# Patient Record
Sex: Female | Born: 1975 | Race: White | Hispanic: No | Smoking: Current every day smoker
Health system: Southern US, Community
[De-identification: ages and names within clinical notes are randomized; demographics above are authoritative.]

## PROBLEM LIST (undated history)

## (undated) DIAGNOSIS — F909 Attention-deficit hyperactivity disorder, unspecified type: Secondary | ICD-10-CM

## (undated) DIAGNOSIS — E7800 Pure hypercholesterolemia, unspecified: Secondary | ICD-10-CM

## (undated) DIAGNOSIS — F32A Depression, unspecified: Secondary | ICD-10-CM

## (undated) DIAGNOSIS — E079 Disorder of thyroid, unspecified: Secondary | ICD-10-CM

## (undated) DIAGNOSIS — F329 Major depressive disorder, single episode, unspecified: Secondary | ICD-10-CM

## (undated) DIAGNOSIS — I10 Essential (primary) hypertension: Secondary | ICD-10-CM

## (undated) HISTORY — PX: TUBAL LIGATION: SHX77

## (undated) HISTORY — PX: TONSILLECTOMY: SUR1361

---

## 1998-07-12 ENCOUNTER — Emergency Department (HOSPITAL_COMMUNITY): Admission: EM | Admit: 1998-07-12 | Discharge: 1998-07-12 | Payer: Self-pay | Admitting: Emergency Medicine

## 1999-01-10 ENCOUNTER — Emergency Department (HOSPITAL_COMMUNITY): Admission: EM | Admit: 1999-01-10 | Discharge: 1999-01-10 | Payer: Self-pay | Admitting: Emergency Medicine

## 1999-04-07 ENCOUNTER — Other Ambulatory Visit: Admission: RE | Admit: 1999-04-07 | Discharge: 1999-04-07 | Payer: Self-pay | Admitting: Gynecology

## 1999-07-04 ENCOUNTER — Inpatient Hospital Stay (HOSPITAL_COMMUNITY): Admission: AD | Admit: 1999-07-04 | Discharge: 1999-07-04 | Payer: Self-pay | Admitting: Obstetrics & Gynecology

## 1999-07-30 ENCOUNTER — Inpatient Hospital Stay (HOSPITAL_COMMUNITY): Admission: AD | Admit: 1999-07-30 | Discharge: 1999-08-01 | Payer: Self-pay | Admitting: Obstetrics and Gynecology

## 1999-10-03 ENCOUNTER — Inpatient Hospital Stay (HOSPITAL_COMMUNITY): Admission: AD | Admit: 1999-10-03 | Discharge: 1999-10-03 | Payer: Self-pay | Admitting: Obstetrics and Gynecology

## 1999-10-04 ENCOUNTER — Inpatient Hospital Stay (HOSPITAL_COMMUNITY): Admission: AD | Admit: 1999-10-04 | Discharge: 1999-10-06 | Payer: Self-pay | Admitting: Obstetrics and Gynecology

## 1999-11-08 ENCOUNTER — Other Ambulatory Visit: Admission: RE | Admit: 1999-11-08 | Discharge: 1999-11-08 | Payer: Self-pay | Admitting: Obstetrics and Gynecology

## 2000-08-21 ENCOUNTER — Inpatient Hospital Stay (HOSPITAL_COMMUNITY): Admission: AD | Admit: 2000-08-21 | Discharge: 2000-08-21 | Payer: Self-pay | Admitting: Obstetrics

## 2000-10-06 ENCOUNTER — Inpatient Hospital Stay (HOSPITAL_COMMUNITY): Admission: AD | Admit: 2000-10-06 | Discharge: 2000-10-06 | Payer: Self-pay | Admitting: *Deleted

## 2001-05-08 ENCOUNTER — Inpatient Hospital Stay (HOSPITAL_COMMUNITY): Admission: AD | Admit: 2001-05-08 | Discharge: 2001-05-08 | Payer: Self-pay | Admitting: *Deleted

## 2001-06-23 ENCOUNTER — Emergency Department (HOSPITAL_COMMUNITY): Admission: EM | Admit: 2001-06-23 | Discharge: 2001-06-23 | Payer: Self-pay | Admitting: Cardiology

## 2002-10-13 ENCOUNTER — Emergency Department (HOSPITAL_COMMUNITY): Admission: EM | Admit: 2002-10-13 | Discharge: 2002-10-13 | Payer: Self-pay | Admitting: Emergency Medicine

## 2002-11-27 ENCOUNTER — Emergency Department (HOSPITAL_COMMUNITY): Admission: EM | Admit: 2002-11-27 | Discharge: 2002-11-27 | Payer: Self-pay | Admitting: Emergency Medicine

## 2002-11-27 ENCOUNTER — Encounter: Payer: Self-pay | Admitting: Emergency Medicine

## 2002-11-29 ENCOUNTER — Encounter: Payer: Self-pay | Admitting: *Deleted

## 2002-11-29 ENCOUNTER — Emergency Department (HOSPITAL_COMMUNITY): Admission: EM | Admit: 2002-11-29 | Discharge: 2002-11-29 | Payer: Self-pay | Admitting: *Deleted

## 2003-01-01 ENCOUNTER — Emergency Department (HOSPITAL_COMMUNITY): Admission: EM | Admit: 2003-01-01 | Discharge: 2003-01-01 | Payer: Self-pay | Admitting: Emergency Medicine

## 2003-04-23 ENCOUNTER — Inpatient Hospital Stay (HOSPITAL_COMMUNITY): Admission: AD | Admit: 2003-04-23 | Discharge: 2003-04-23 | Payer: Self-pay | Admitting: *Deleted

## 2003-07-20 ENCOUNTER — Emergency Department (HOSPITAL_COMMUNITY): Admission: EM | Admit: 2003-07-20 | Discharge: 2003-07-20 | Payer: Self-pay | Admitting: Emergency Medicine

## 2003-07-20 IMAGING — CT CT HEAD W/O CM
2 of 3 series · 14 of 33 positions shown, 18 images · non-contrast
Comparison: none

CLINICAL DATA: Headache.  

 HEAD CT WITHOUT CONTRAST, [DATE]
TECHNIQUE: Routine uninfused CT scanning of the brain was performed.  
 There is no evidence of intracranial hemorrhage, brain edema, or mass effect. The ventricles are normal. No extra-axial abnormalities are identified. Bone windows show no significant abnormalities.
 IMPRESSION
 Negative non-contrast head CT. 
 [REDACTED]

[Series 2: — · sagittal · 0.43mm/px · 1 of 3 slices shown (1 of 2)]
[im 2/3  brain]
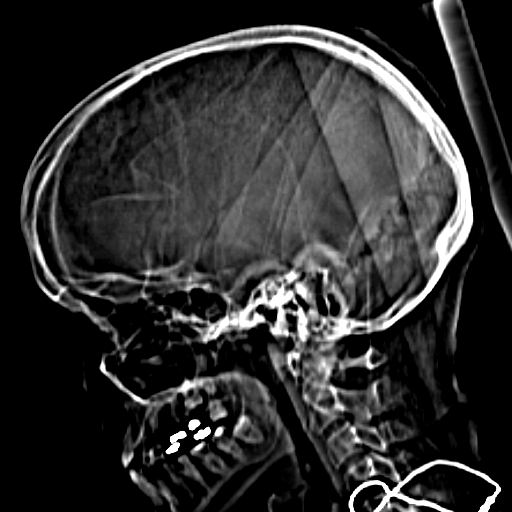

[Series 3: — · axial · 0.43mm/px · z∈[-162,-52]mm · 13 of 26 slices shown, 17 images (2 of 2)]
[im 2/26  brain]
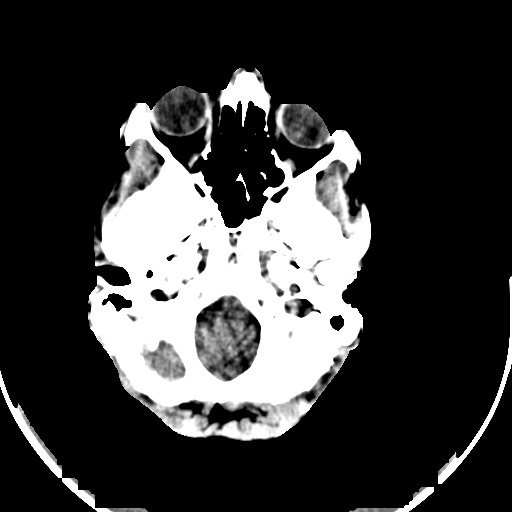
[im 2/26  bone]
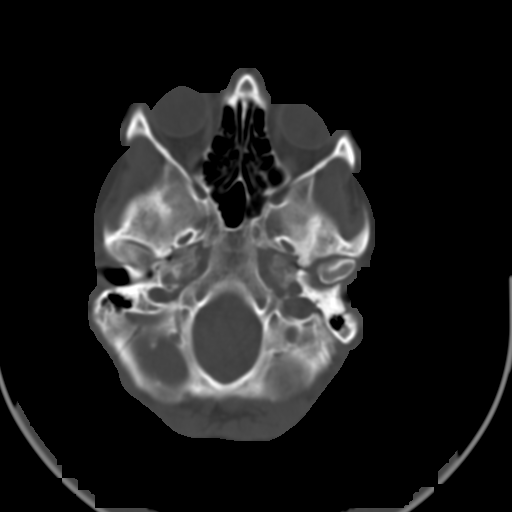
[im 4/26  brain]
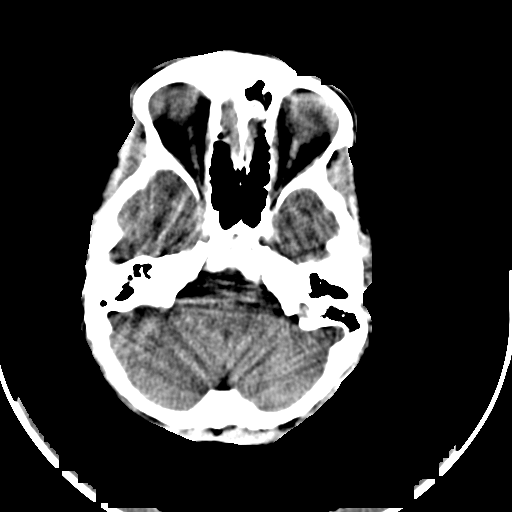
[im 6/26  brain]
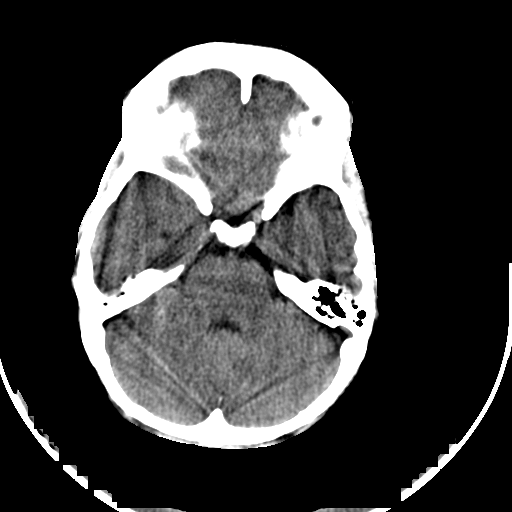
[im 8/26  brain]
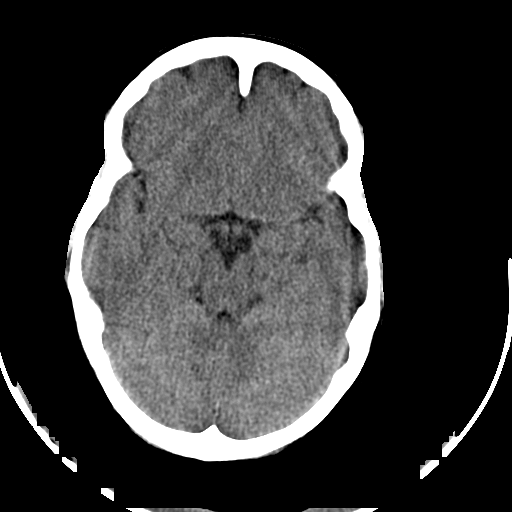
[im 9/26  brain]
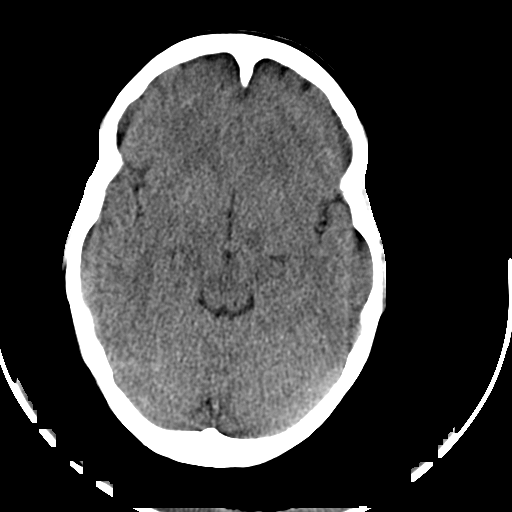
[im 9/26  bone]
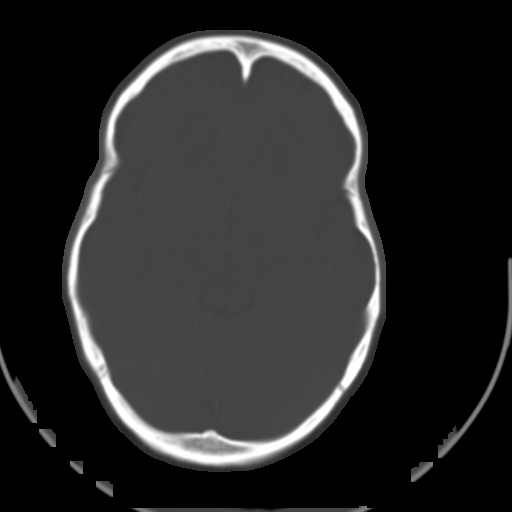
[im 11/26  brain]
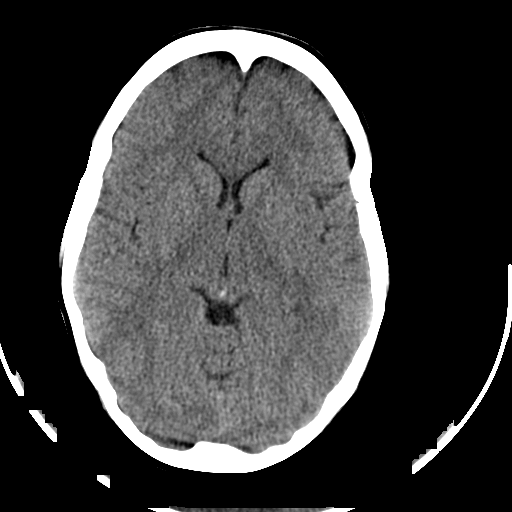
[im 13/26  brain]
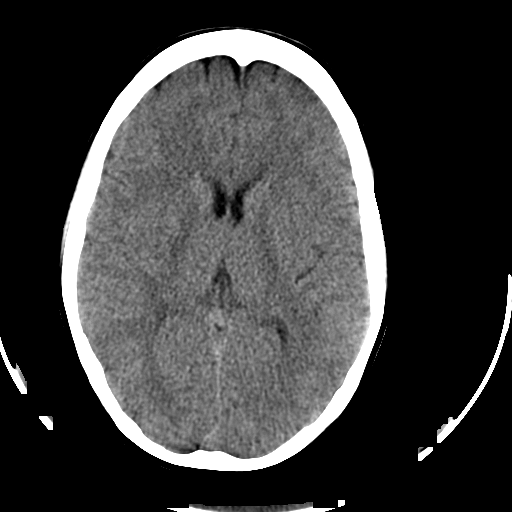
[im 15/26  brain]
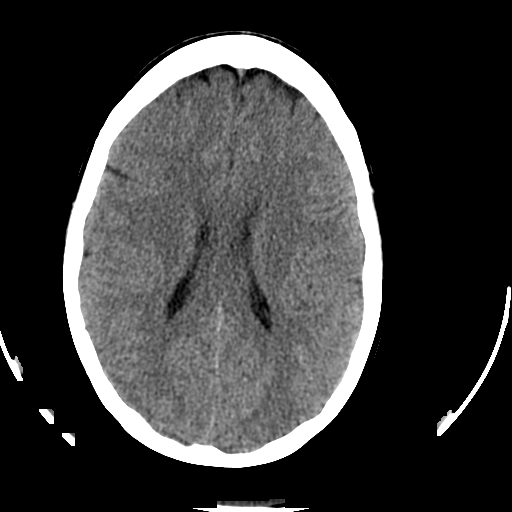
[im 17/26  brain]
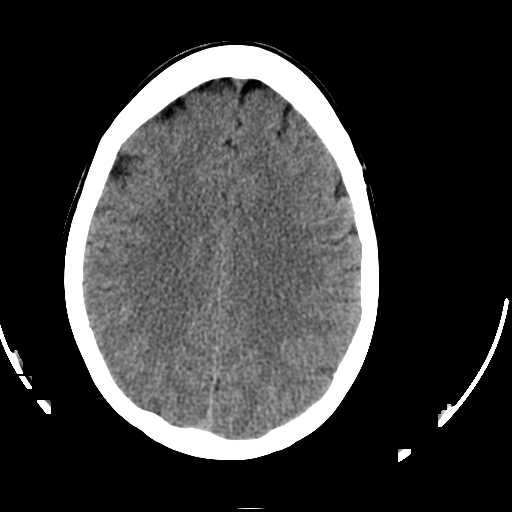
[im 17/26  bone]
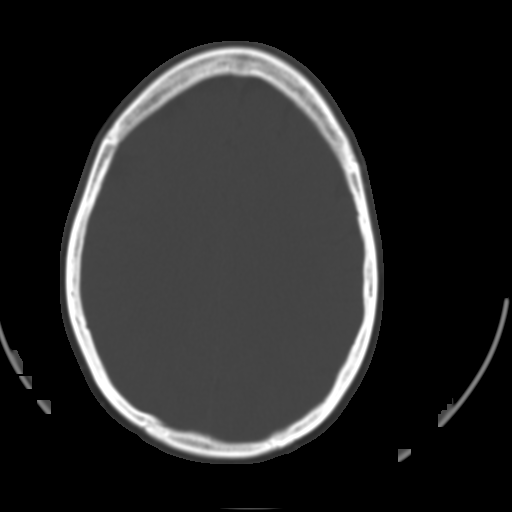
[im 18/26  brain]
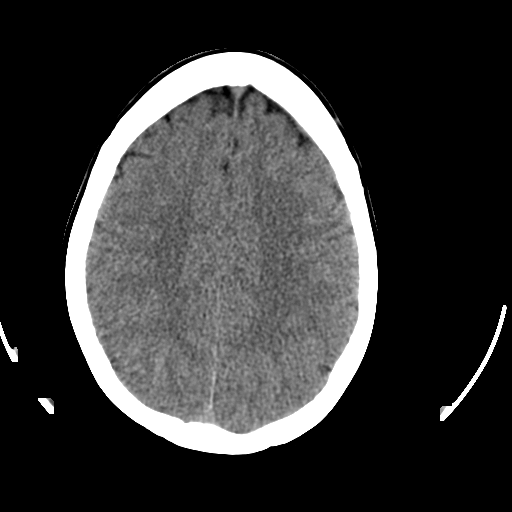
[im 20/26  brain]
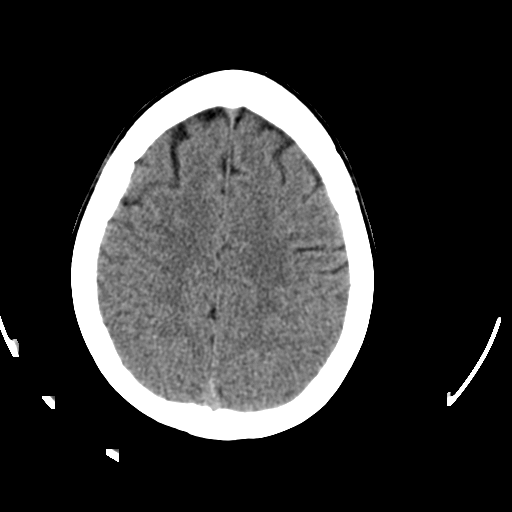
[im 22/26  brain]
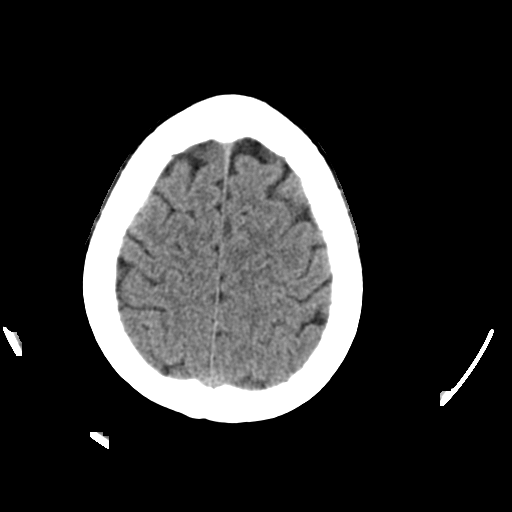
[im 24/26  brain]
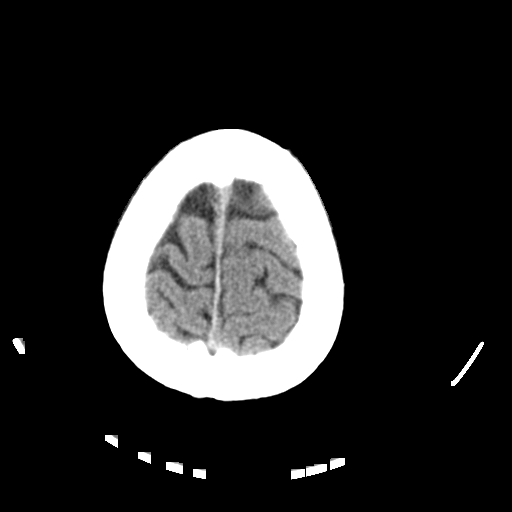
[im 24/26  bone]
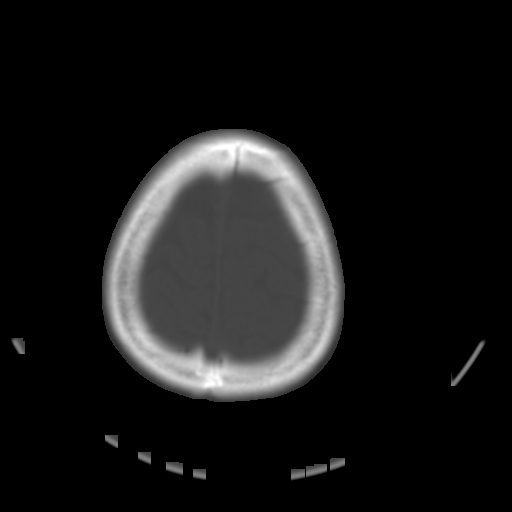

[14 of 33 positions shown; findings below may reference images not displayed]

## 2003-08-28 ENCOUNTER — Inpatient Hospital Stay (HOSPITAL_COMMUNITY): Admission: AD | Admit: 2003-08-28 | Discharge: 2003-08-28 | Payer: Self-pay | Admitting: Obstetrics & Gynecology

## 2003-08-28 IMAGING — US US OB COMP LESS 14 WK
1 series · 14 of 28 positions shown · non-contrast
Comparison: none

CLINICAL DATA: Early pregnancy with bleeding and abdominal pain.  
EARLY OBSTETRICAL ULTRASOUND WTH TRANSVAGINAL:
Multiple images of the uterus and adnexa were obtained using a transabdominal and endovaginal approaches. 
There is an intrauterine gestational sac identified which correlates with a 5 week 4 day gestation.  A yolk sac is seen, but no evidence for a fetal pole is discerned.  This would not be expected at the mean sac diameter of 9.1 mm obtained today and follow-up evaluation in one weeks time can be undertaken to assess for appropriate progression of pregnancy if desired.
A small subchorionic hemorrhage is seen.
Both ovaries are visualized and have a normal appearance with the right ovary measuring 2.3  x1.7 x 2.1 cm and the left ovary measuring 1.8 x 1.9 x 1.9 cm.  No cul-de-sac or periovarian fluid is seen and no separate adnexal masses are noted.
IMPRESSION
5 week 4 day intrauterine gestational sac with yolk sac.  Follow-up evaluation can be undertaken in one week if desired for assessment of appropriate progression of pregnancy.  Small subchorionic hemorrhage.  Normal ovaries.

[Series 1: unknown · 0.23mm/px · 14 of 52 slices shown]
[im 2/52]
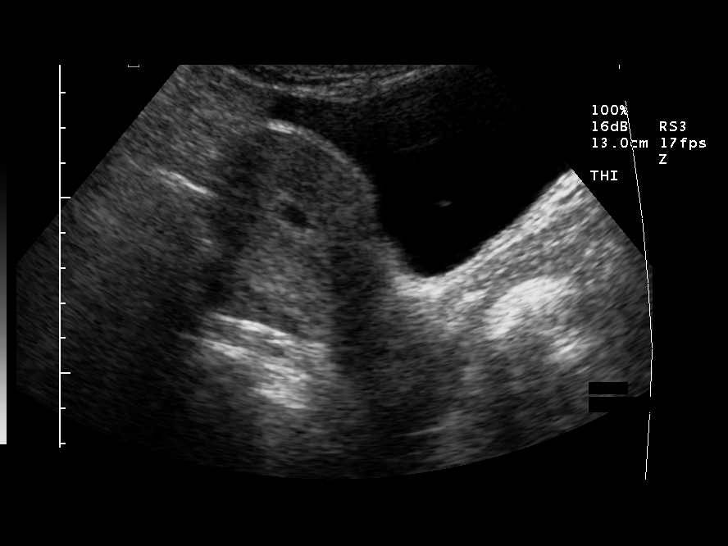
[im 6/52]
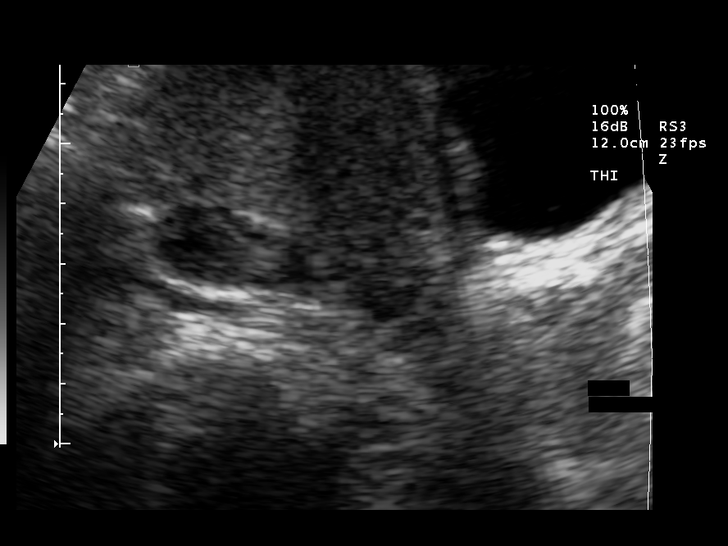
[im 10/52]
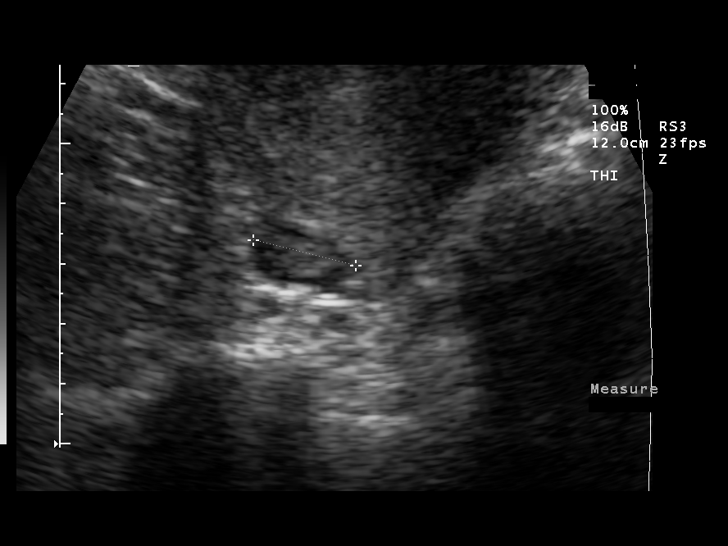
[im 14/52]
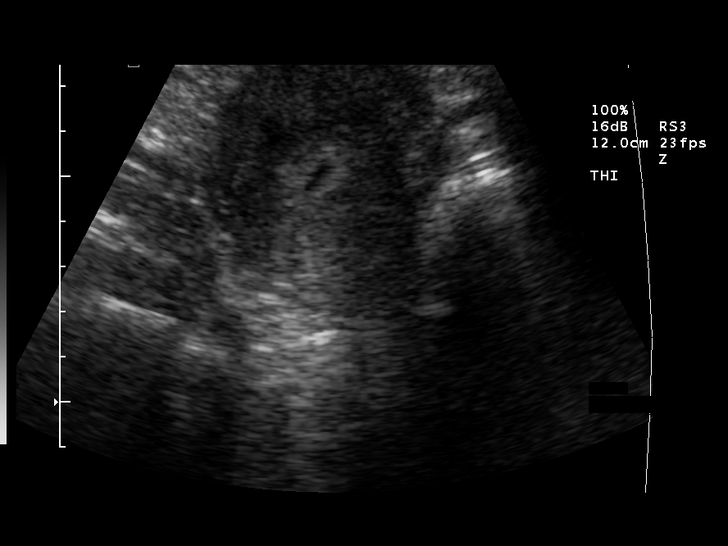
[im 18/52]
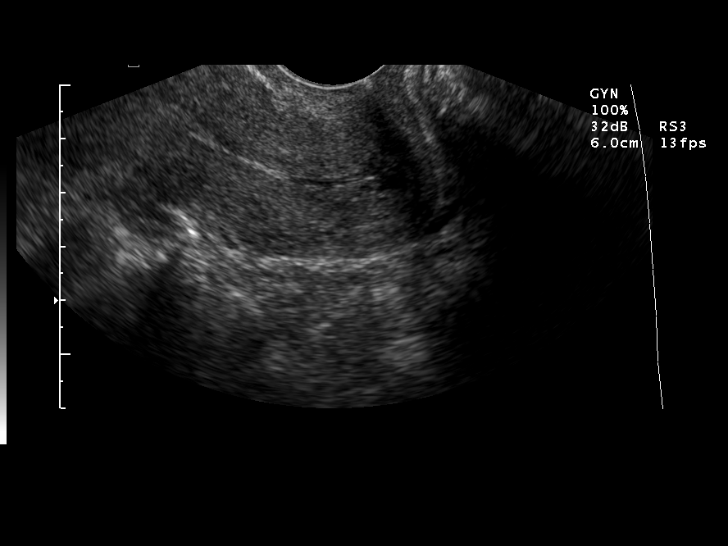
[im 21/52]
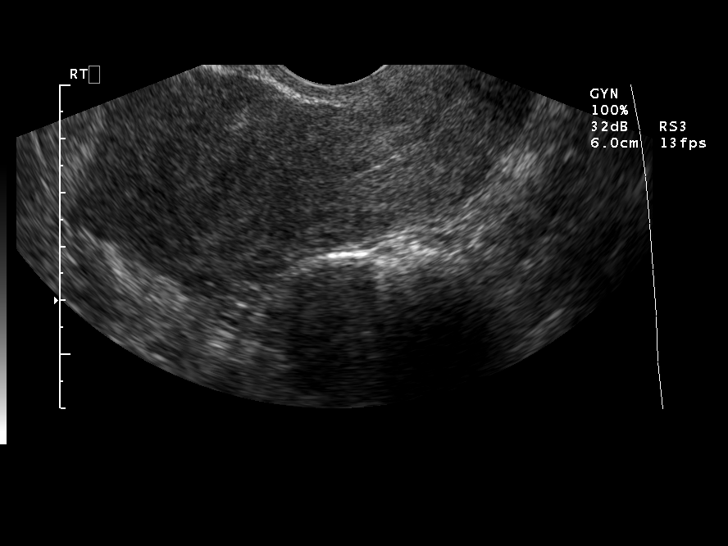
[im 25/52]
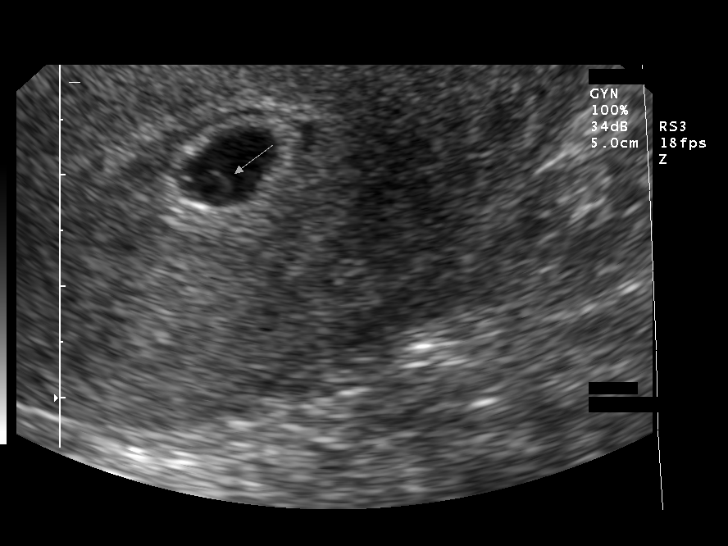
[im 29/52]
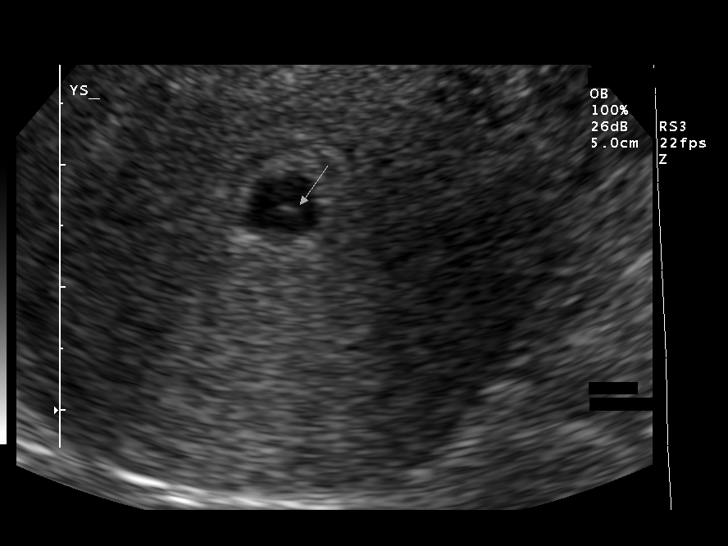
[im 33/52]
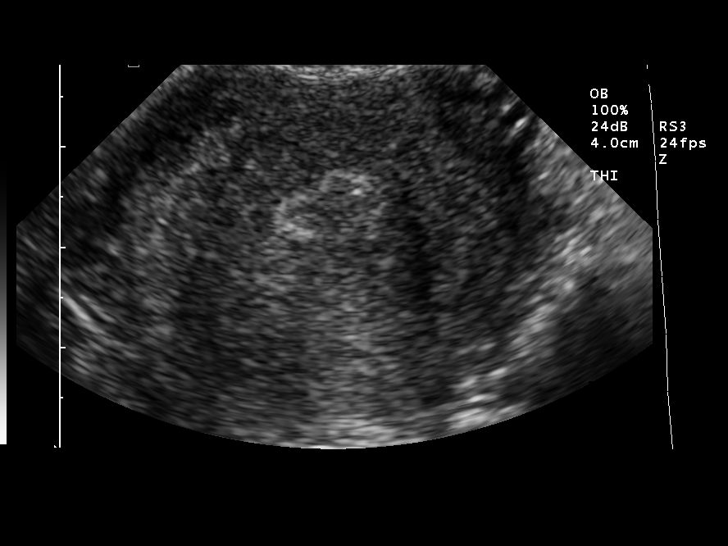
[im 36/52]
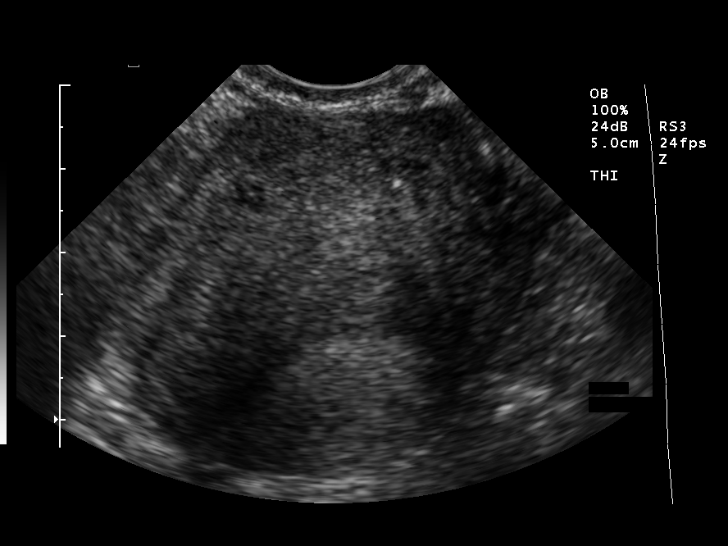
[im 40/52]
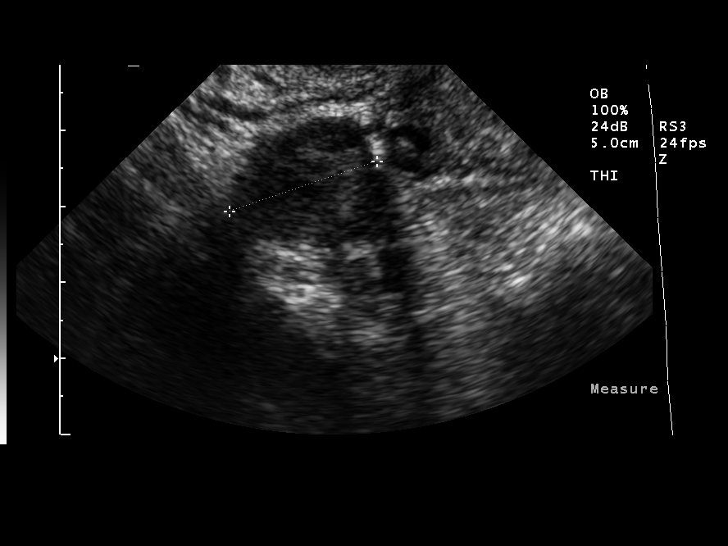
[im 44/52]
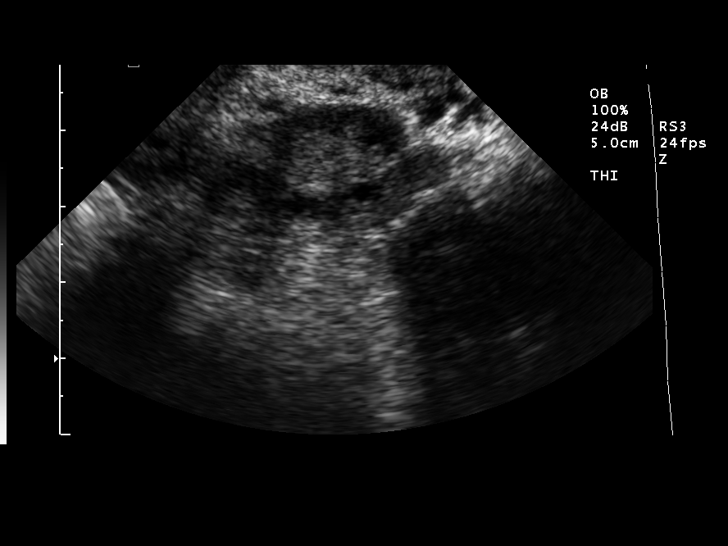
[im 48/52]
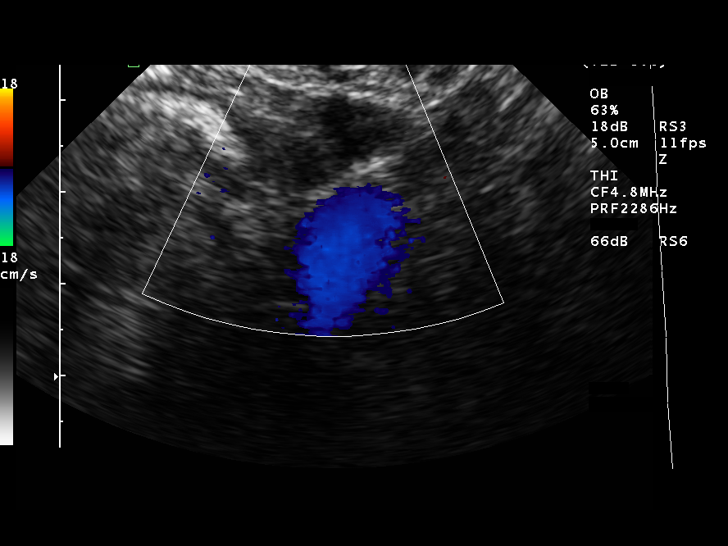
[im 52/52]
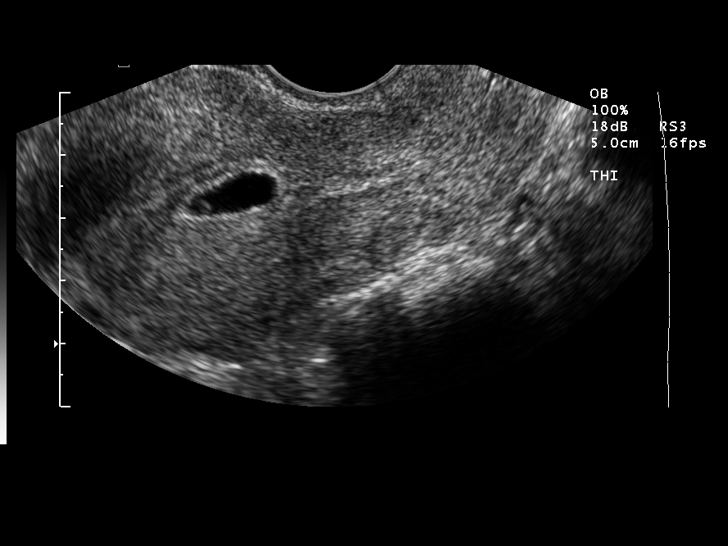

[14 of 28 positions shown; findings below may reference images not displayed]

## 2003-08-30 ENCOUNTER — Inpatient Hospital Stay (HOSPITAL_COMMUNITY): Admission: AD | Admit: 2003-08-30 | Discharge: 2003-08-30 | Payer: Self-pay | Admitting: Family Medicine

## 2004-08-02 ENCOUNTER — Emergency Department (HOSPITAL_COMMUNITY): Admission: EM | Admit: 2004-08-02 | Discharge: 2004-08-02 | Payer: Self-pay | Admitting: Emergency Medicine

## 2006-11-16 ENCOUNTER — Ambulatory Visit (HOSPITAL_BASED_OUTPATIENT_CLINIC_OR_DEPARTMENT_OTHER): Admission: RE | Admit: 2006-11-16 | Discharge: 2006-11-16 | Payer: Self-pay | Admitting: Otolaryngology

## 2007-03-02 ENCOUNTER — Inpatient Hospital Stay (HOSPITAL_COMMUNITY): Admission: AD | Admit: 2007-03-02 | Discharge: 2007-03-02 | Payer: Self-pay | Admitting: Obstetrics and Gynecology

## 2009-07-07 ENCOUNTER — Emergency Department (HOSPITAL_COMMUNITY): Admission: EM | Admit: 2009-07-07 | Discharge: 2009-07-07 | Payer: Self-pay | Admitting: Family Medicine

## 2009-11-24 ENCOUNTER — Emergency Department (HOSPITAL_BASED_OUTPATIENT_CLINIC_OR_DEPARTMENT_OTHER): Admission: EM | Admit: 2009-11-24 | Discharge: 2009-11-24 | Payer: Self-pay | Admitting: Emergency Medicine

## 2010-01-23 ENCOUNTER — Emergency Department (HOSPITAL_COMMUNITY): Admission: EM | Admit: 2010-01-23 | Discharge: 2010-01-23 | Payer: Self-pay | Admitting: Emergency Medicine

## 2010-01-23 IMAGING — CR DG FOOT COMPLETE 3+V*R*
3 series · 3 of 3 positions shown · non-contrast
Comparison: None.

CLINICAL DATA: Pain post fall

RIGHT FOOT COMPLETE - 3+ VIEW

[t foot ap right]
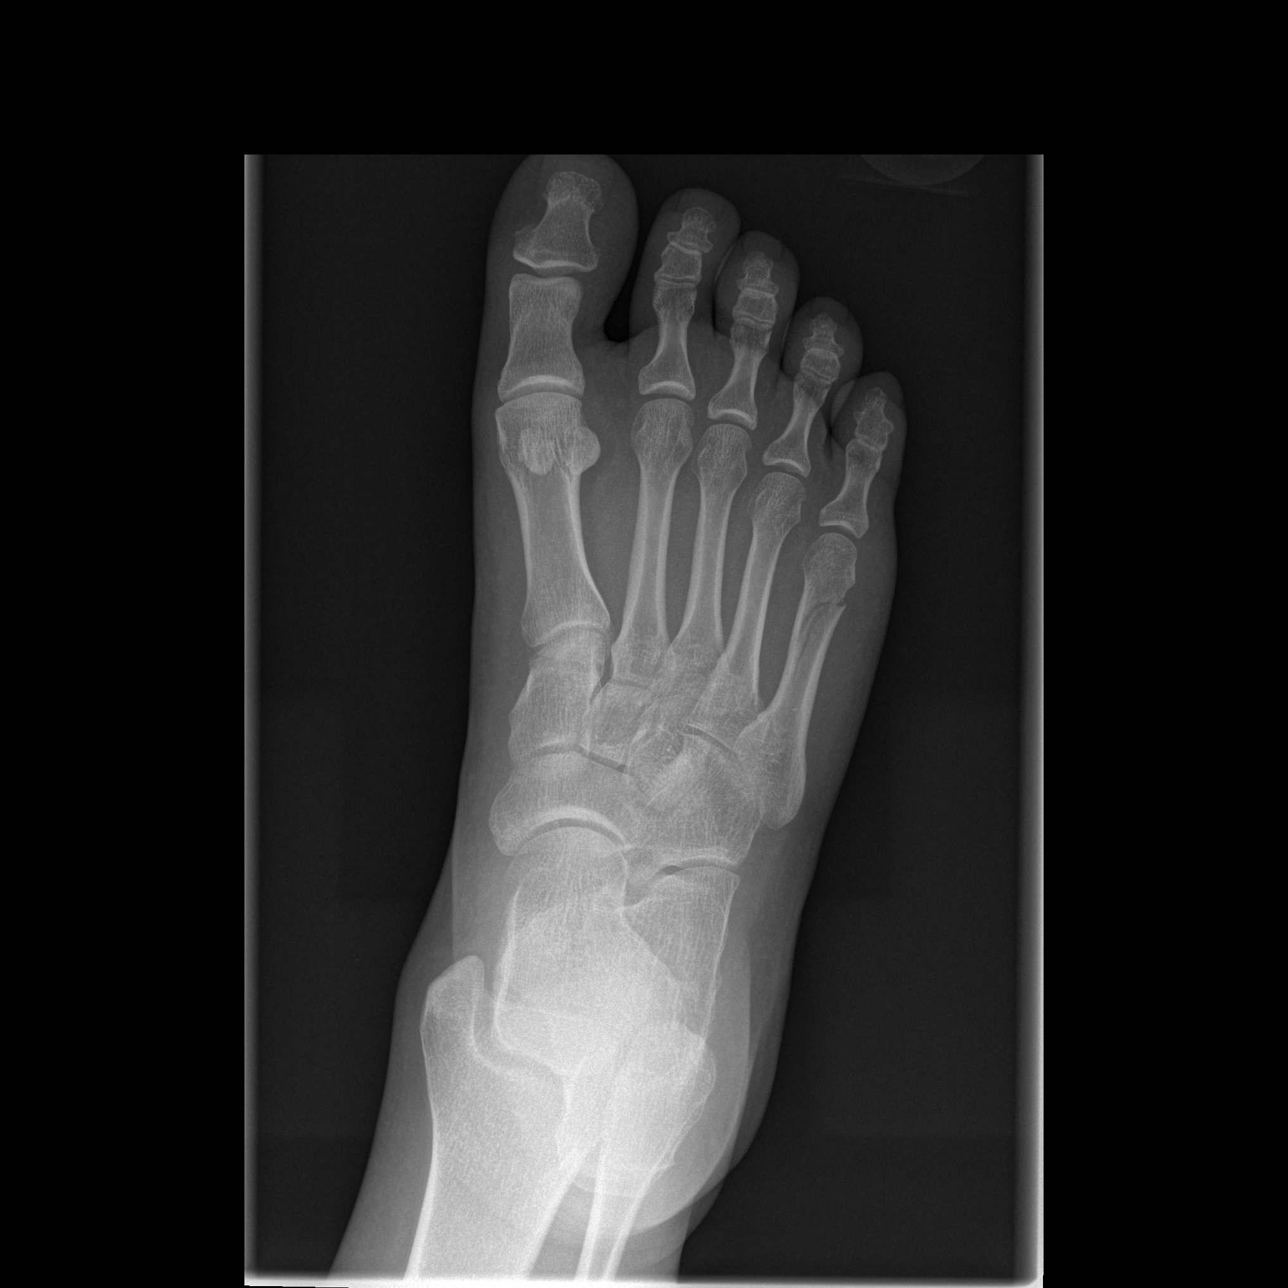

[t foot oblique right]
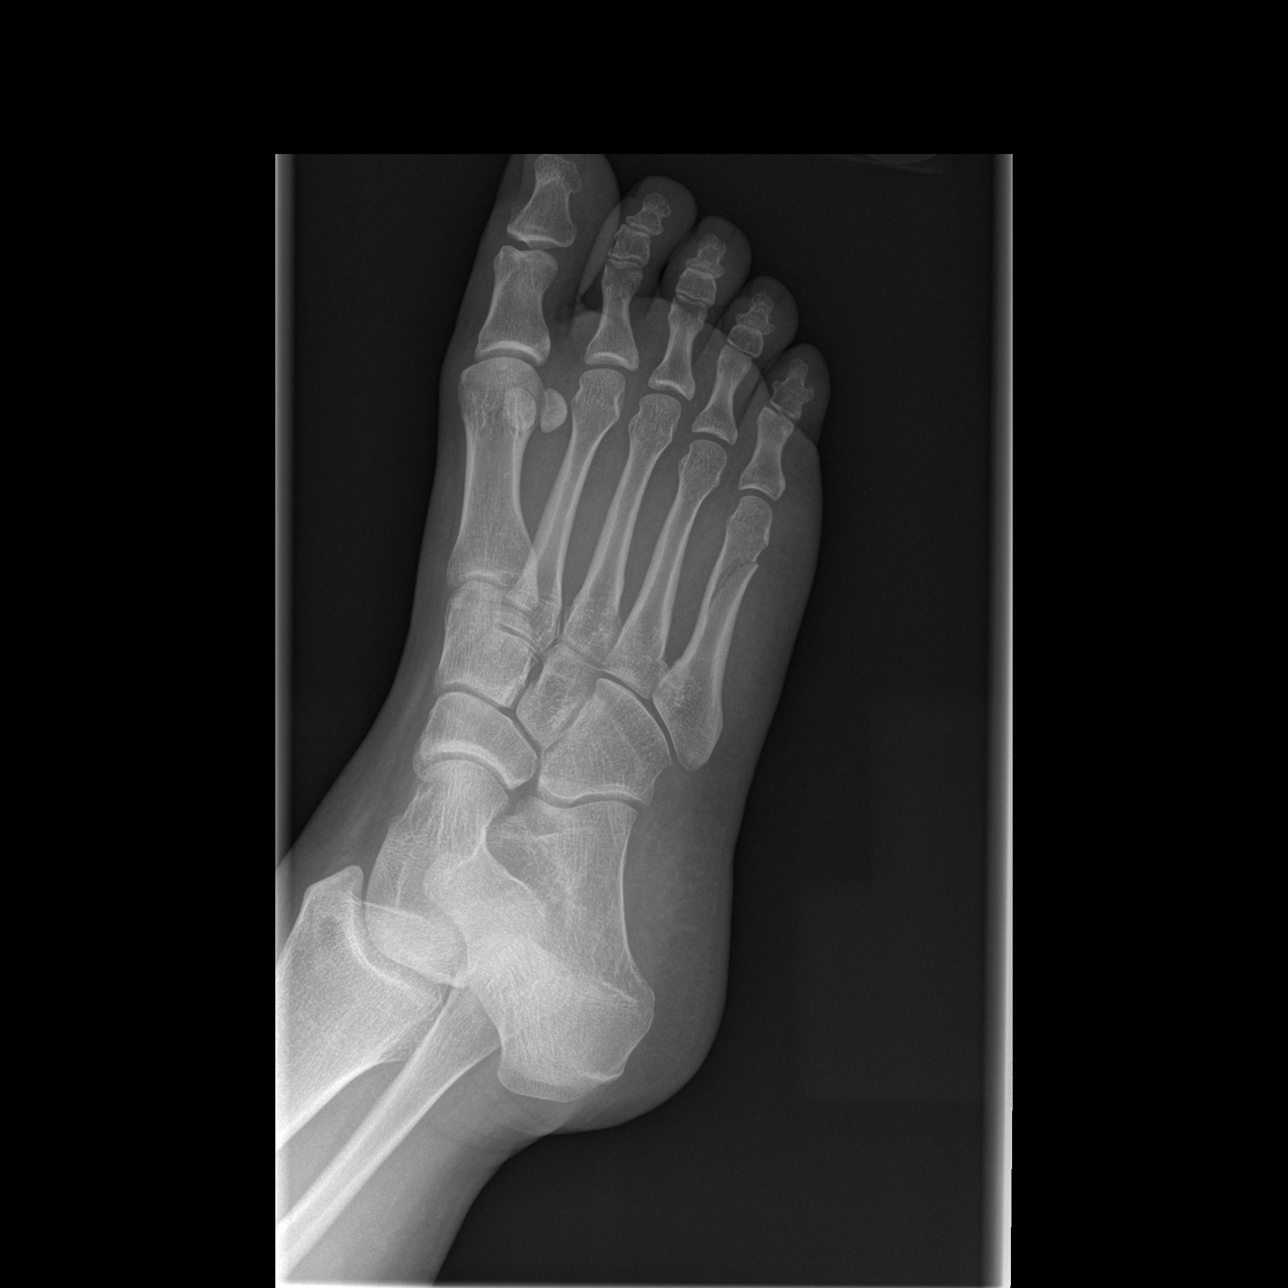

[t foot lat right]
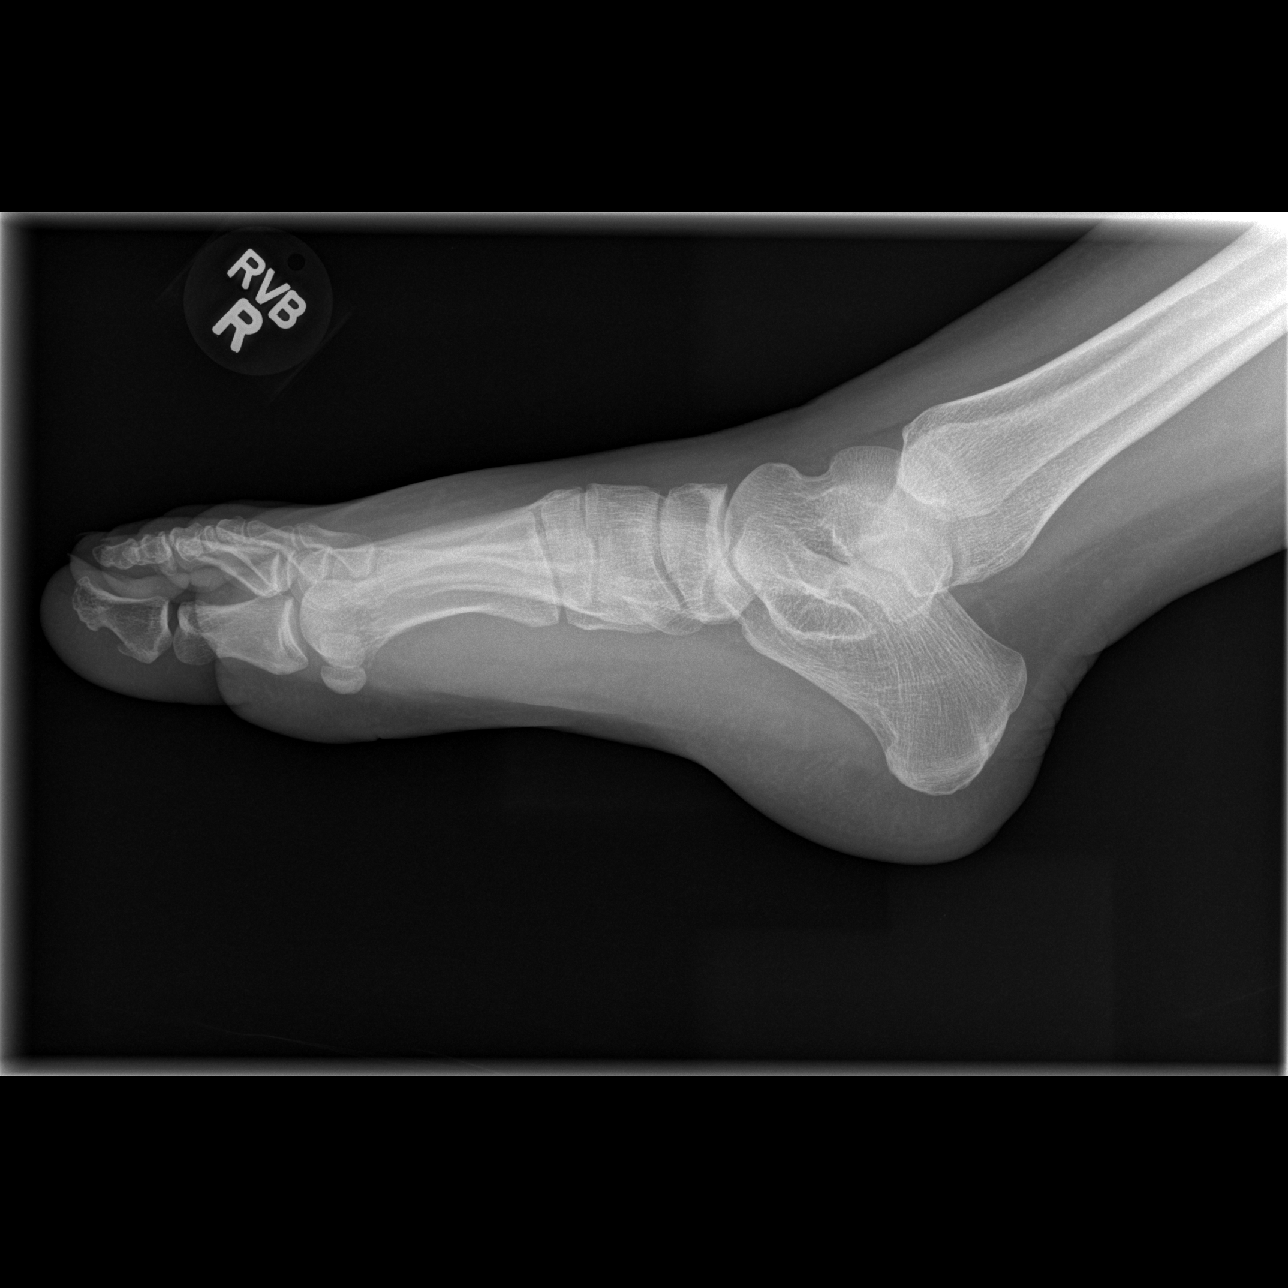

[3 of 3 positions shown; findings below may reference images not displayed]

FINDINGS: There is oblique fracture of the shaft of the fifth
metatarsal, with approximately 1 mm medial displacement of the
distal fracture fragment.  No evidence of fracture line extension
to the articular surface.  No other bony abnormalities evident.  No
significant bony degenerative change.  Normal mineralization and
alignment.
IMPRESSION: 1.  Minimally-displaced oblique fracture, right fifth metatarsal
shaft.

## 2010-06-14 ENCOUNTER — Emergency Department (HOSPITAL_BASED_OUTPATIENT_CLINIC_OR_DEPARTMENT_OTHER)
Admission: EM | Admit: 2010-06-14 | Discharge: 2010-06-14 | Disposition: A | Discharge status: Home / Self Care | Payer: Medicaid Other | Attending: Emergency Medicine | Admitting: Emergency Medicine

## 2010-06-14 ENCOUNTER — Emergency Department (INDEPENDENT_AMBULATORY_CARE_PROVIDER_SITE_OTHER): Payer: Medicaid Other

## 2010-06-14 DIAGNOSIS — R002 Palpitations: Secondary | ICD-10-CM

## 2010-06-14 DIAGNOSIS — K224 Dyskinesia of esophagus: Secondary | ICD-10-CM | POA: Insufficient documentation

## 2010-06-14 DIAGNOSIS — R079 Chest pain, unspecified: Secondary | ICD-10-CM

## 2010-06-14 DIAGNOSIS — F17200 Nicotine dependence, unspecified, uncomplicated: Secondary | ICD-10-CM | POA: Insufficient documentation

## 2010-06-14 LAB — CBC
MCH: 30 pg (ref 26.0–34.0)
Platelets: 279 10*3/uL (ref 150–400)
RBC: 4.46 MIL/uL (ref 3.87–5.11)
RDW: 11.9 % (ref 11.5–15.5)
WBC: 9.4 10*3/uL (ref 4.0–10.5)

## 2010-06-14 LAB — BASIC METABOLIC PANEL
BUN: 17 mg/dL (ref 6–23)
Calcium: 8.9 mg/dL (ref 8.4–10.5)
GFR calc non Af Amer: >60 mL/min (ref >60)
Potassium: 3.8 mEq/L (ref 3.5–5.1)

## 2010-06-14 LAB — RAPID STREP SCREEN (MED CTR MEBANE ONLY): Streptococcus, Group A Screen (Direct): NEGATIVE

## 2010-06-14 LAB — POCT CARDIAC MARKERS: Troponin i, poc: <0.05 ng/mL (ref 0.00–0.09)

## 2010-06-14 IMAGING — CR DG CHEST 2V
2 series · 2 of 2 positions shown · non-contrast
Comparison: None.

CLINICAL DATA: Chest pain.  Palpitations.

CHEST - 2 VIEW

[w chest pa]
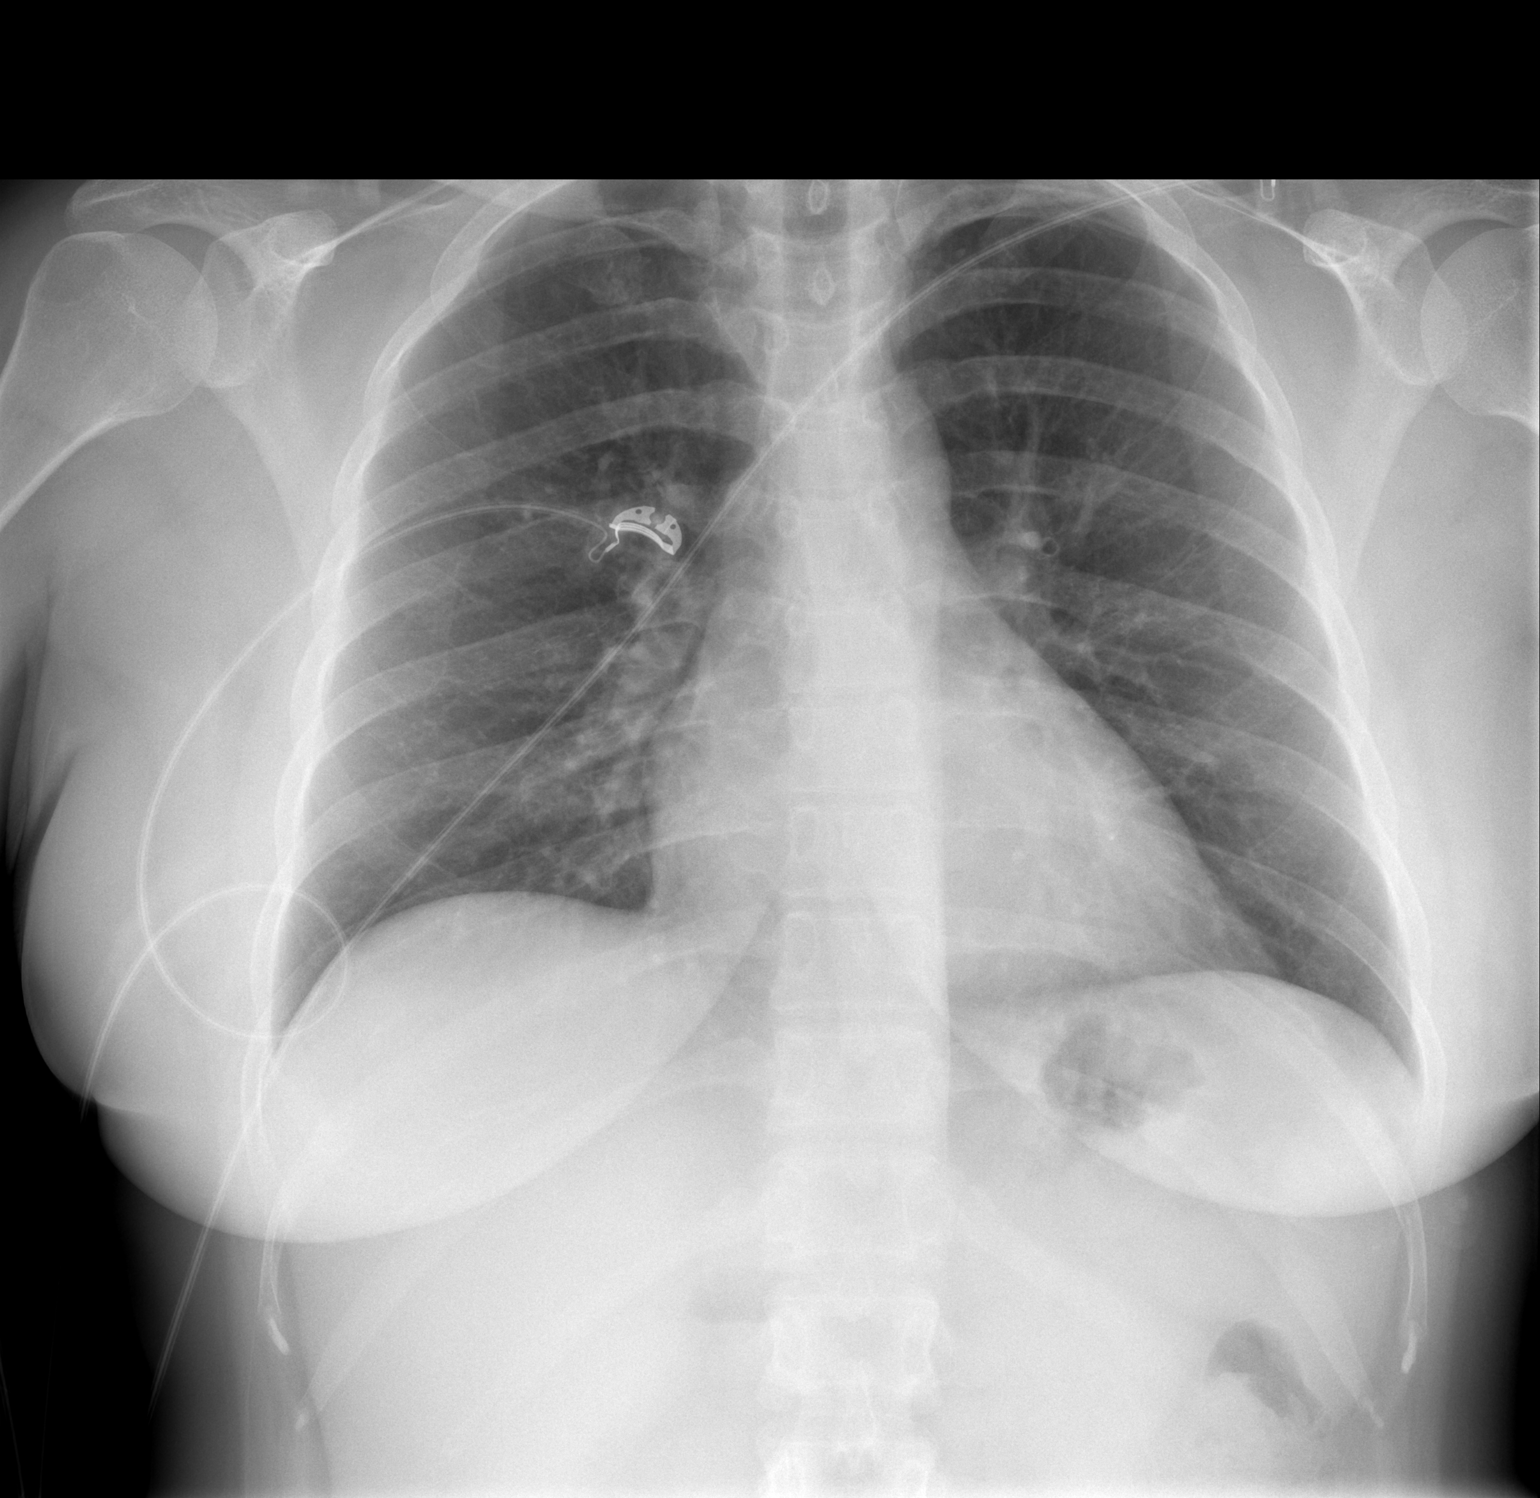

[w chest lat]
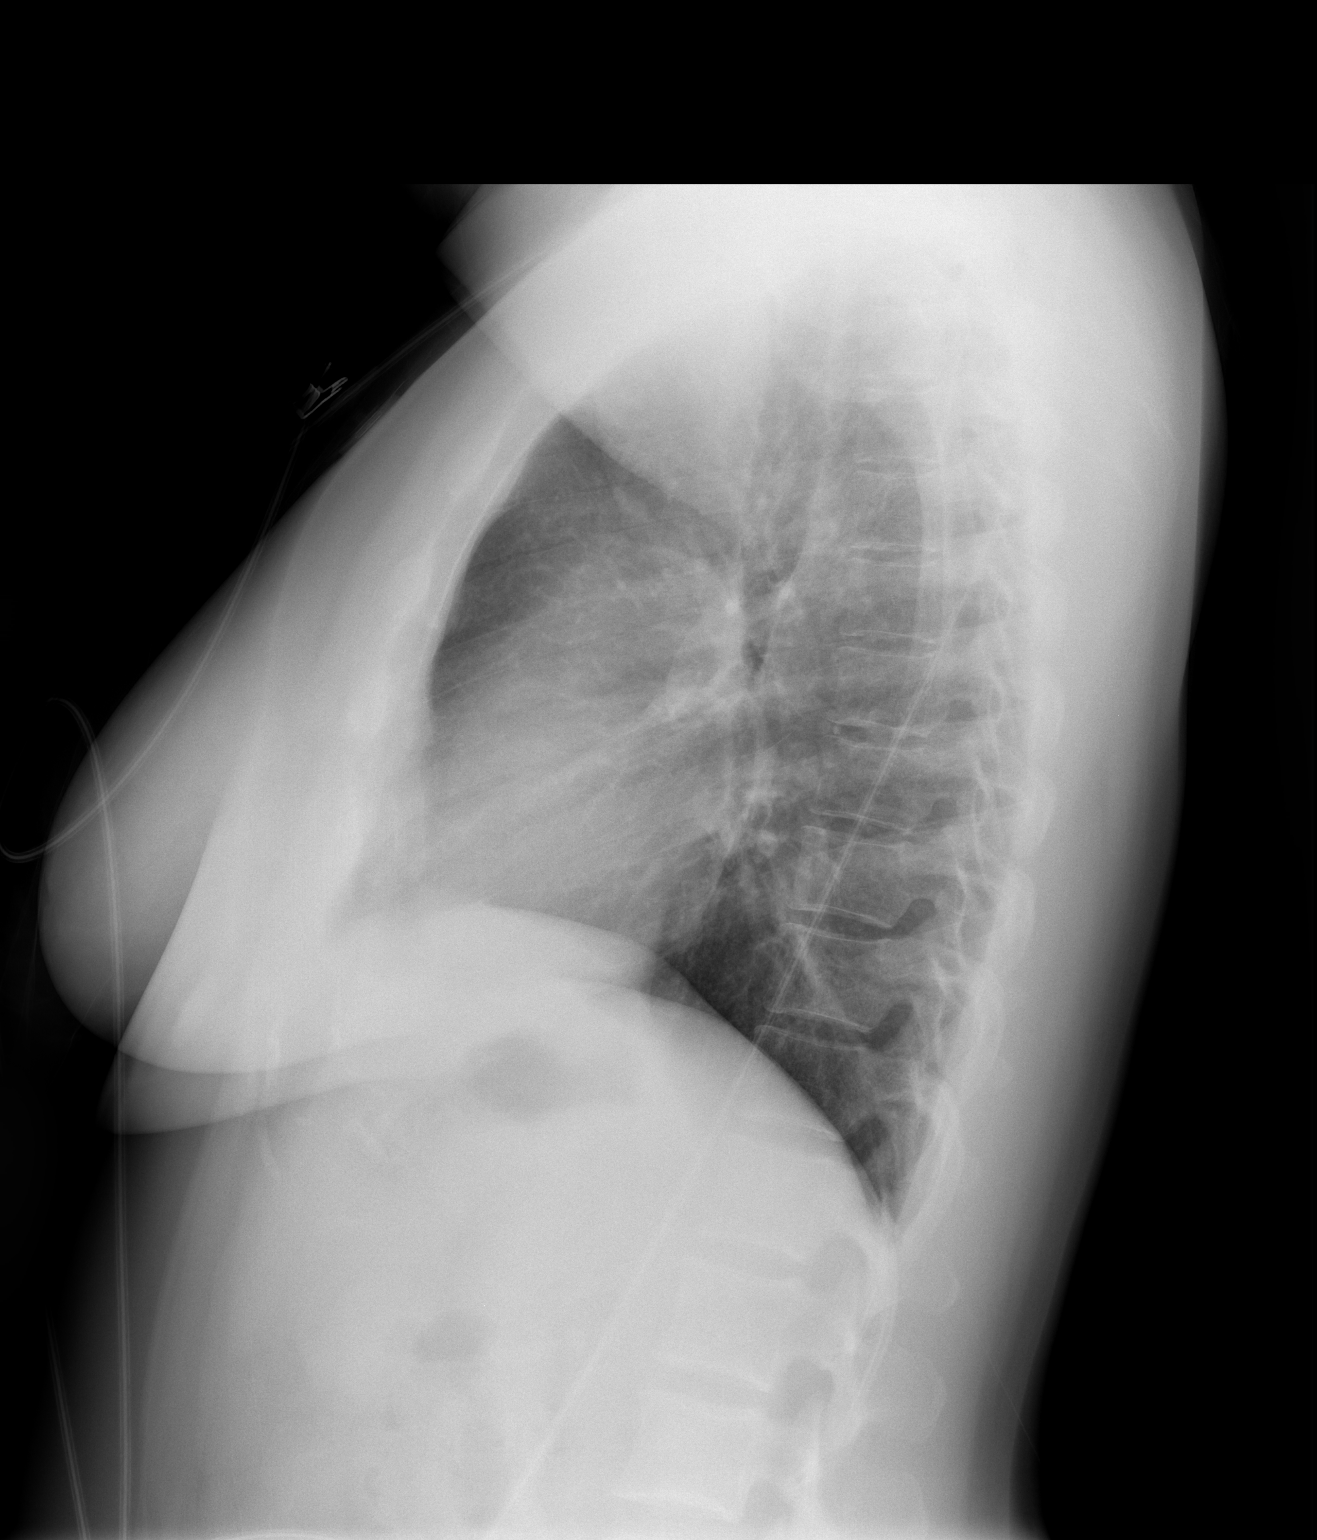

[2 of 2 positions shown; findings below may reference images not displayed]

FINDINGS: The lungs are clear bilaterally.  No confluent airspace
opacities, pleural effuions or pneumothoracies are seen.  The heart
is normal in size and contour.  The upper abdomen and osseous
structures are normal.
IMPRESSION: No acute cardiopulmonary disease.

## 2010-07-17 ENCOUNTER — Inpatient Hospital Stay (INDEPENDENT_AMBULATORY_CARE_PROVIDER_SITE_OTHER)
Admission: RE | Admit: 2010-07-17 | Discharge: 2010-07-17 | Disposition: A | Discharge status: Home / Self Care | Payer: Medicaid Other | Source: Ambulatory Visit | Attending: Family Medicine | Admitting: Family Medicine

## 2010-07-17 DIAGNOSIS — M79609 Pain in unspecified limb: Secondary | ICD-10-CM

## 2010-07-17 LAB — URINALYSIS, ROUTINE W REFLEX MICROSCOPIC
Bilirubin Urine: NEGATIVE
Glucose, UA: NEGATIVE mg/dL
Hgb urine dipstick: NEGATIVE
Specific Gravity, Urine: 1.025 (ref 1.005–1.030)
Urobilinogen, UA: 0.2 mg/dL (ref 0.0–1.0)
pH: 5.5 (ref 5.0–8.0)

## 2010-07-17 LAB — BASIC METABOLIC PANEL
CO2: 24 mEq/L (ref 19–32)
Chloride: 105 mEq/L (ref 96–112)
Glucose, Bld: 85 mg/dL (ref 70–99)
Potassium: 4.6 mEq/L (ref 3.5–5.1)
Sodium: 142 mEq/L (ref 135–145)

## 2010-07-17 LAB — POCT I-STAT, CHEM 8
BUN: 20 mg/dL (ref 6–23)
Calcium, Ion: 1.14 mmol/L (ref 1.12–1.32)
Chloride: 104 mEq/L (ref 96–112)
Glucose, Bld: 97 mg/dL (ref 70–99)
Potassium: 4 mEq/L (ref 3.5–5.1)

## 2010-07-26 LAB — POCT URINALYSIS DIP (DEVICE)
Ketones, ur: NEGATIVE mg/dL
Protein, ur: NEGATIVE mg/dL
Specific Gravity, Urine: 1.025 (ref 1.005–1.030)
Urobilinogen, UA: 0.2 mg/dL (ref 0.0–1.0)

## 2010-07-26 LAB — WET PREP, GENITAL: Trich, Wet Prep: NONE SEEN

## 2010-07-26 LAB — GC/CHLAMYDIA PROBE AMP, GENITAL: Chlamydia, DNA Probe: NEGATIVE

## 2010-08-28 ENCOUNTER — Emergency Department (HOSPITAL_BASED_OUTPATIENT_CLINIC_OR_DEPARTMENT_OTHER)
Admission: EM | Admit: 2010-08-28 | Discharge: 2010-08-28 | Disposition: A | Discharge status: Home / Self Care | Payer: Medicaid Other | Attending: Emergency Medicine | Admitting: Emergency Medicine

## 2010-08-28 DIAGNOSIS — I10 Essential (primary) hypertension: Secondary | ICD-10-CM | POA: Insufficient documentation

## 2010-08-28 DIAGNOSIS — N760 Acute vaginitis: Secondary | ICD-10-CM | POA: Insufficient documentation

## 2010-08-28 DIAGNOSIS — A499 Bacterial infection, unspecified: Secondary | ICD-10-CM | POA: Insufficient documentation

## 2010-08-28 DIAGNOSIS — B9689 Other specified bacterial agents as the cause of diseases classified elsewhere: Secondary | ICD-10-CM | POA: Insufficient documentation

## 2010-08-28 LAB — WET PREP, GENITAL: Yeast Wet Prep HPF POC: NONE SEEN

## 2010-08-28 LAB — URINALYSIS, ROUTINE W REFLEX MICROSCOPIC
Bilirubin Urine: NEGATIVE
Hgb urine dipstick: NEGATIVE
Nitrite: NEGATIVE
Protein, ur: NEGATIVE mg/dL
Specific Gravity, Urine: 1.021 (ref 1.005–1.030)
Urobilinogen, UA: 0.2 mg/dL (ref 0.0–1.0)

## 2010-08-30 LAB — GC/CHLAMYDIA PROBE AMP, GENITAL: GC Probe Amp, Genital: NEGATIVE
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
## 2010-09-17 NOTE — Discharge Summary (Signed)
[facility]  Patient:    Alice Martinez, Alice Martinez                        MRN: [id number] Adm. Date:  [id number] Disc. Date: [id number] Attending:  Miguel Aschoff Dictator:   Leilani Able, P.A.                           Discharge Summary  FINAL DIAGNOSIS:              Preterm labor.  HISTORY OF PRESENT ILLNESS:   This 35 year old G5, P2-0-2-2, was admitted at 24-5/7 weeks complaining of contractions.  The patient had a prior history of preterm contractions with her other pregnancies.  She was seen in triage, having contractions every four to five minutes, moderate intensity, which did not respond to subcutaneous terbutaline.  The patient was admitted at this time for magnesium sulfate therapy.  She was afebrile upon admission.  Her cervix was noted to be , long, and a -2 station.  HOSPITAL COURSE:              The patient was admitted.  Magnesium sulfate was begun.  On the magnesium sulfate the patient was having rare contractions.  She was able to be weaned off of her magnesium sulfate and started on terbutaline 2.5 mg 1 every four hours.  The patient was also given Zantac 300 mg for relief of dyspepsia.  The patient had been stable on terbutaline for 24 hours and had not had any contractions.  At this point the patient was felt ready for discharge.  She was sent home on a regular diet, told to decrease activities.  Was given terbutaline 2.5 mg 1 every four hours.  No intercourse or breast stimulation.  Was given Zantac 150 mg 1 b.i.d. for dyspepsia, and told to follow up in the office in one week.  DD:  08/20/99 TD:  08/21/99 Job: [id number]

## 2010-12-09 DIAGNOSIS — E782 Mixed hyperlipidemia: Secondary | ICD-10-CM | POA: Insufficient documentation

## 2011-02-09 LAB — WET PREP, GENITAL
Clue Cells Wet Prep HPF POC: NONE SEEN
Yeast Wet Prep HPF POC: NONE SEEN

## 2011-02-09 LAB — URINALYSIS, ROUTINE W REFLEX MICROSCOPIC
Nitrite: NEGATIVE
Protein, ur: NEGATIVE
Specific Gravity, Urine: >1.03 — ABNORMAL HIGH
Urobilinogen, UA: 0.2

## 2011-02-09 LAB — URINE MICROSCOPIC-ADD ON

## 2011-02-14 LAB — POCT HEMOGLOBIN-HEMACUE: Hemoglobin: 13.9

## 2012-02-13 DIAGNOSIS — F419 Anxiety disorder, unspecified: Secondary | ICD-10-CM | POA: Insufficient documentation

## 2012-02-13 HISTORY — DX: Anxiety disorder, unspecified: F41.9

## 2014-01-12 ENCOUNTER — Emergency Department (HOSPITAL_BASED_OUTPATIENT_CLINIC_OR_DEPARTMENT_OTHER): Payer: Medicaid Other

## 2014-01-12 ENCOUNTER — Encounter (HOSPITAL_BASED_OUTPATIENT_CLINIC_OR_DEPARTMENT_OTHER): Payer: Self-pay | Admitting: Emergency Medicine

## 2014-01-12 ENCOUNTER — Emergency Department (HOSPITAL_BASED_OUTPATIENT_CLINIC_OR_DEPARTMENT_OTHER)
Admission: EM | Admit: 2014-01-12 | Discharge: 2014-01-12 | Disposition: A | Discharge status: Home / Self Care | Payer: Medicaid Other | Attending: Emergency Medicine | Admitting: Emergency Medicine

## 2014-01-12 DIAGNOSIS — M722 Plantar fascial fibromatosis: Secondary | ICD-10-CM | POA: Diagnosis not present

## 2014-01-12 DIAGNOSIS — Z79899 Other long term (current) drug therapy: Secondary | ICD-10-CM | POA: Insufficient documentation

## 2014-01-12 DIAGNOSIS — F17200 Nicotine dependence, unspecified, uncomplicated: Secondary | ICD-10-CM | POA: Diagnosis not present

## 2014-01-12 DIAGNOSIS — E7800 Pure hypercholesterolemia, unspecified: Secondary | ICD-10-CM | POA: Diagnosis not present

## 2014-01-12 DIAGNOSIS — M25579 Pain in unspecified ankle and joints of unspecified foot: Secondary | ICD-10-CM | POA: Insufficient documentation

## 2014-01-12 DIAGNOSIS — I10 Essential (primary) hypertension: Secondary | ICD-10-CM | POA: Insufficient documentation

## 2014-01-12 HISTORY — DX: Pure hypercholesterolemia, unspecified: E78.00

## 2014-01-12 HISTORY — DX: Essential (primary) hypertension: I10

## 2014-01-12 IMAGING — CR DG FOOT COMPLETE 3+V*L*
3 series · 3 of 3 positions shown · non-contrast
Comparison: None.

CLINICAL DATA: Foot pain.  No injury.

EXAM:
LEFT FOOT - COMPLETE 3+ VIEW

[t foot ap left]
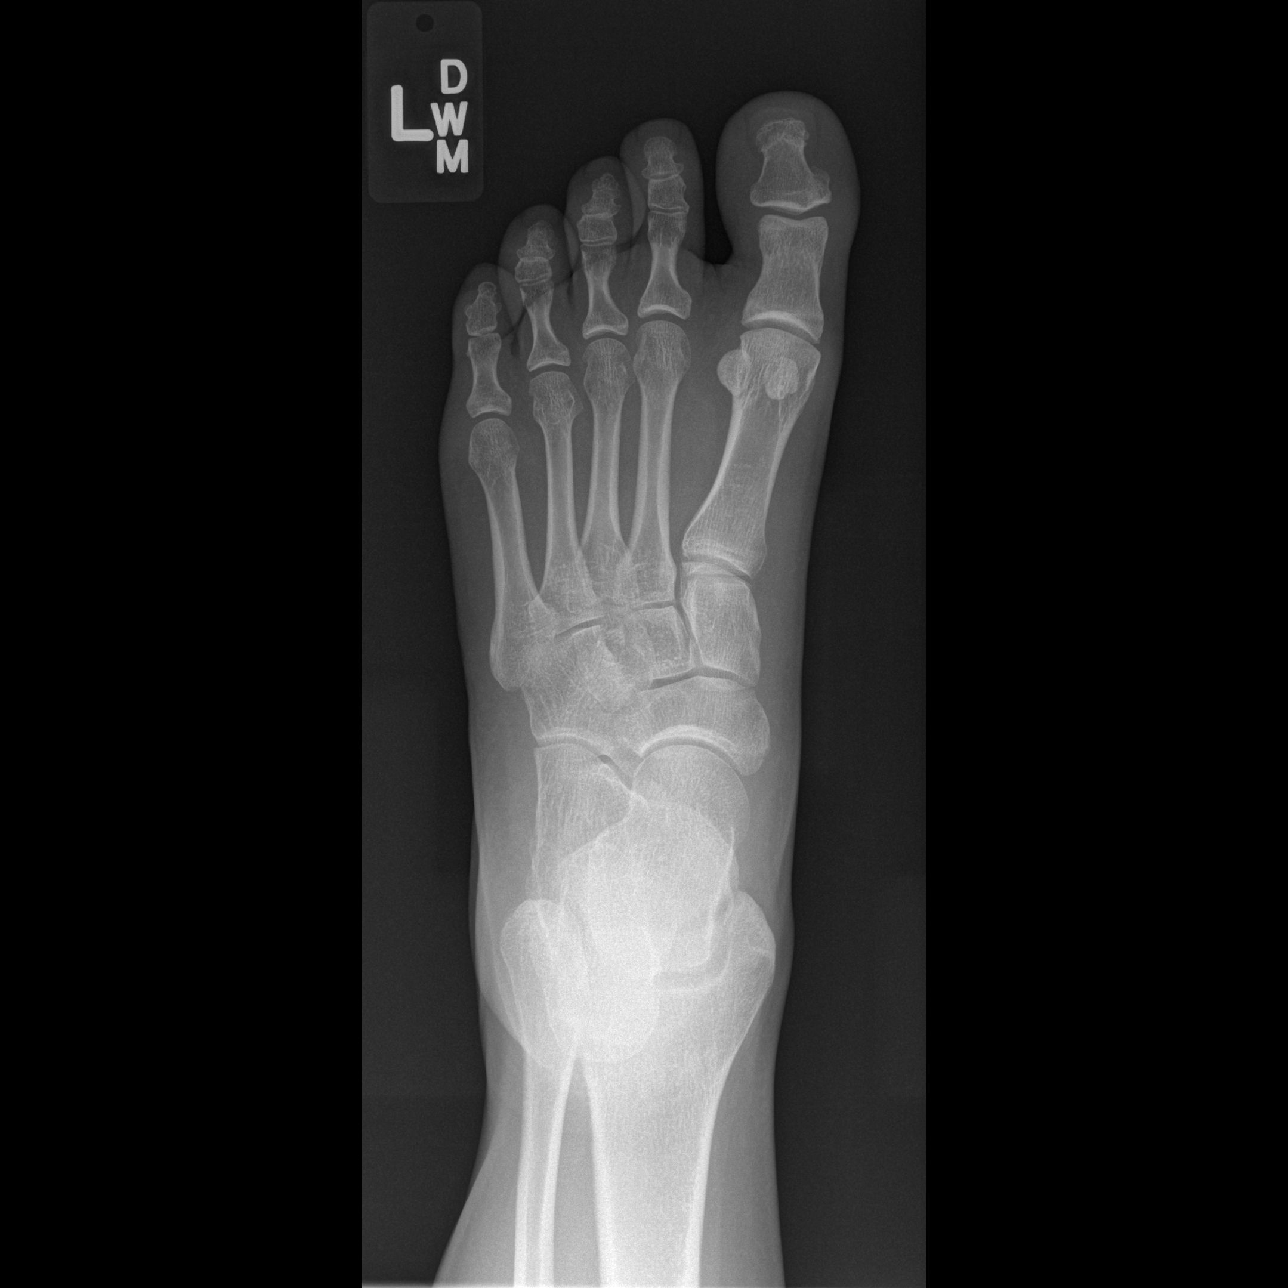

[t foot oblique left]
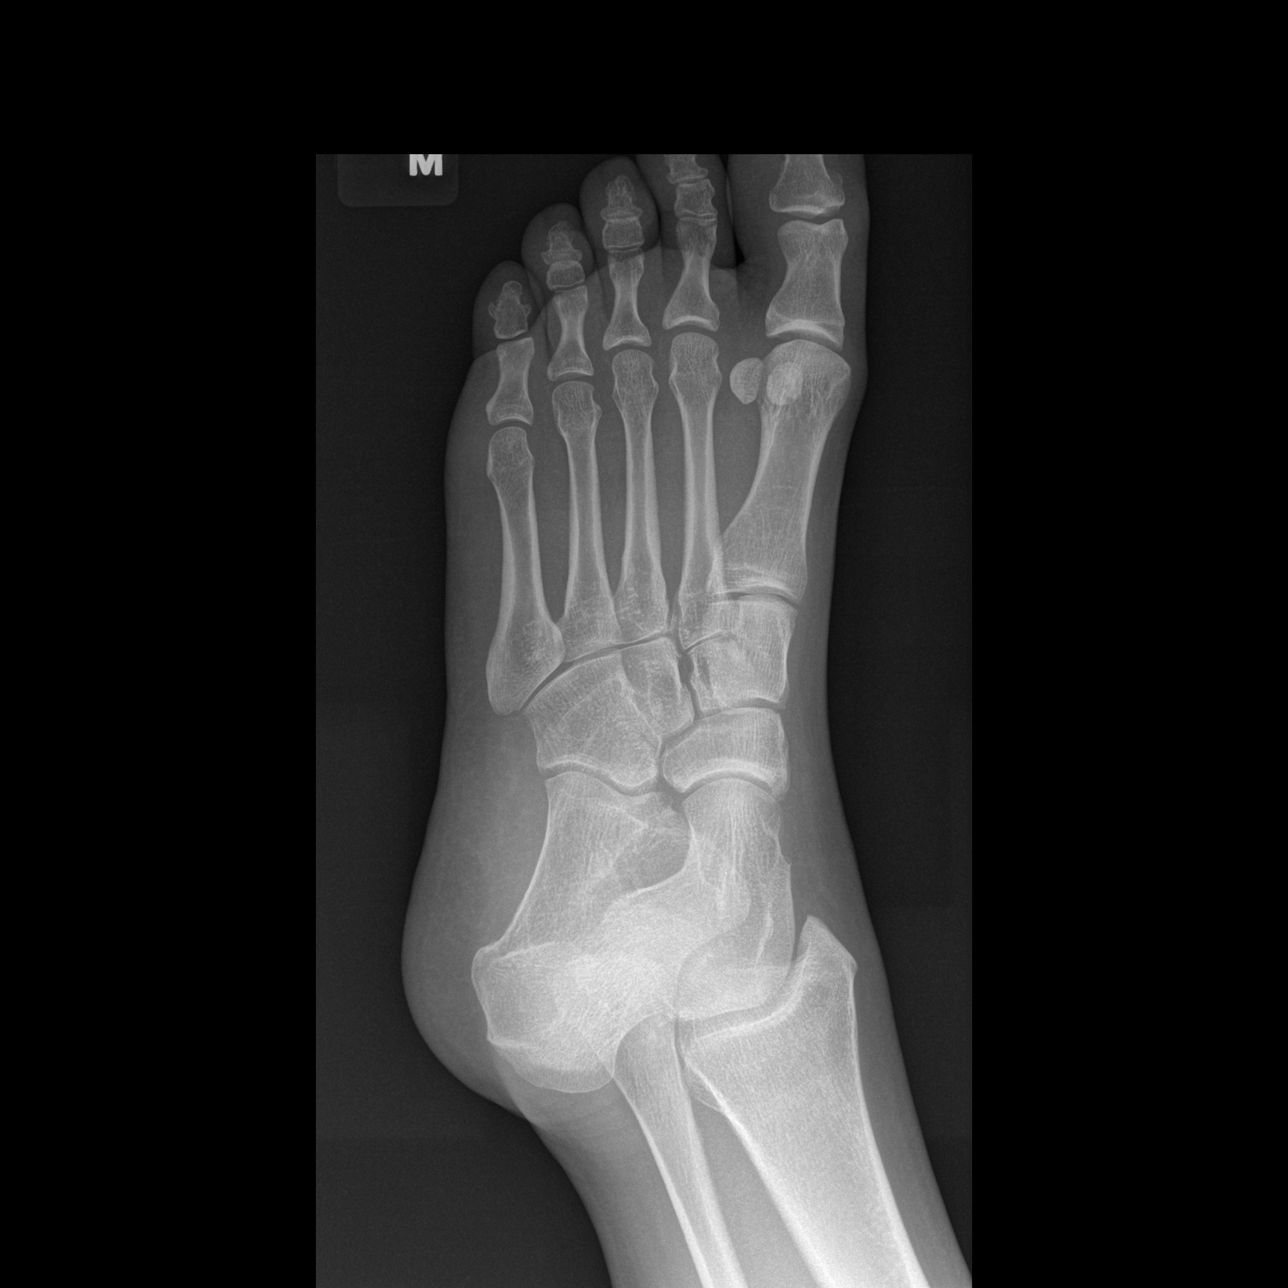

[t foot lat left]
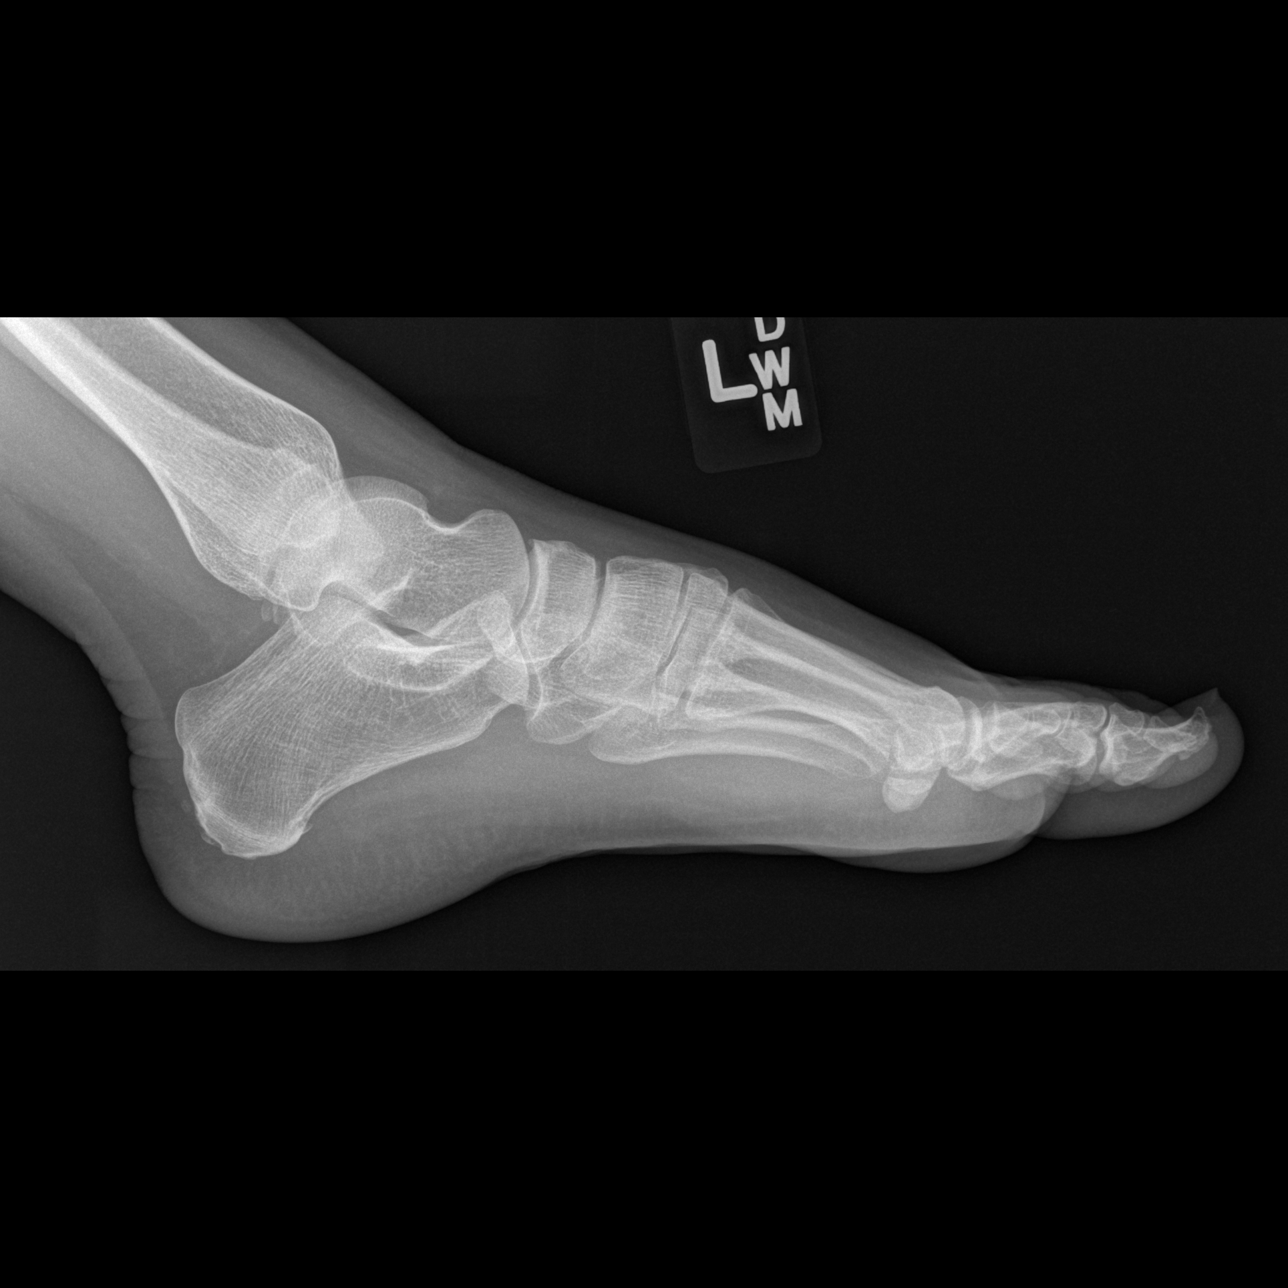

[3 of 3 positions shown; findings below may reference images not displayed]

FINDINGS: Anatomic alignment. No fracture. Soft tissues appear within normal
limits.
IMPRESSION: Negative.

## 2014-01-12 MED ORDER — HYDROCODONE-ACETAMINOPHEN 5-325 MG PO TABS: 2.0000 | ORAL_TABLET | ORAL | Status: DC | PRN

## 2014-01-12 MED ORDER — IBUPROFEN 800 MG PO TABS: 800.0000 mg | ORAL_TABLET | Freq: Three times a day (TID) | ORAL | Status: DC

## 2014-01-12 NOTE — ED Provider Notes (Signed)
Medical screening examination/treatment/procedure(s) were performed by non-physician practitioner and as supervising physician I was immediately available for consultation/collaboration.   EKG Interpretation None        Clella Mckeel, MD 01/12/14 1533 

## 2014-01-12 NOTE — ED Notes (Signed)
Patient states her left foot is painful, thinks it may be from walking on her feet for a long period of time

## 2014-01-12 NOTE — ED Provider Notes (Signed)
CSN: [id number]     Arrival date & time 01/12/14  1239 History   First MD Initiated Contact with Patient 01/12/14 1257     Chief Complaint  Patient presents with  . Foot Pain     (Consider location/radiation/quality/duration/timing/severity/associated sxs/prior Treatment) Patient is a 38 y.o. female presenting with lower extremity pain. The history is provided by the patient. No language interpreter was used.  Foot Pain This is a new problem. The current episode started today. The problem occurs constantly. The problem has been gradually worsening. Associated symptoms include joint swelling. Nothing aggravates the symptoms. She has tried nothing for the symptoms. The treatment provided moderate relief.   Pt reports pain worse with standing,  Worse in am when she stands up.  Pt has been working a Musician job on hard floors Past Medical History  Diagnosis Date  . Hypertension   . High cholesterol    Past Surgical History  Procedure Laterality Date  . Tonsillectomy     No family history on file. History  Substance Use Topics  . Smoking status: Current Every Day Smoker  . Smokeless tobacco: Not on file  . Alcohol Use: No   OB History   Grav Para Term Preterm Abortions TAB SAB Ect Mult Living                 Review of Systems  Musculoskeletal: Positive for gait problem and joint swelling.  All other systems reviewed and are negative.     Allergies  Review of patient's allergies indicates not on file.  Home Medications   Prior to Admission medications   Medication Sig Start Date End Date Taking? Authorizing Provider  escitalopram (LEXAPRO) 20 MG tablet Take 20 mg by mouth daily.   Yes Historical Provider, MD  hydrochlorothiazide (HYDRODIURIL) 25 MG tablet Take 25 mg by mouth daily.   Yes Historical Provider, MD  simvastatin (ZOCOR) 20 MG tablet Take 20 mg by mouth daily.   Yes Historical Provider, MD   BP 121/75  Pulse 92  Temp(Src) 98.6 F (37 C) (Oral)  Resp  18  Ht  (1.549 m)  Wt 160 lb (72.576 kg)  BMI 30.25 kg/m2  SpO2 98% Physical Exam  Nursing note and vitals reviewed. Constitutional: She appears well-developed and well-nourished.  HENT:  Head: Normocephalic.  Musculoskeletal: She exhibits tenderness.  Tender left foot,  From,  Ns and nv intact  Neurological: She is alert.  Skin: Skin is warm.  Psychiatric: She has a normal mood and affect.    ED Course  Procedures (including critical care time) Labs Review Labs Reviewed - No data to display  Imaging Review Dg Foot Complete Left  01/12/2014   CLINICAL DATA:  Foot pain.  No injury.  EXAM: LEFT FOOT - COMPLETE 3+ VIEW  COMPARISON:  None.  FINDINGS: Anatomic alignment. No fracture. Soft tissues appear within normal limits.  IMPRESSION: Negative.   Electronically Signed   By: Andreas Newport M.D.   On: 01/12/2014 14:17     EKG Interpretation None      MDM   Final diagnoses:  Plantar fascia syndrome    Ace Post op shoe Ibuprofen Hydrocodone Follow up with Dr. Charlsie Merles for evaluation    Elson Areas, PA-C 01/12/14 1512

## 2014-01-12 NOTE — Discharge Instructions (Signed)
Plantar Fasciitis  Plantar fasciitis is a common condition that causes foot pain. It is soreness (inflammation) of the band of tough fibrous tissue on the bottom of the foot that runs from the heel bone (calcaneus) to the ball of the foot. The cause of this soreness may be from excessive standing, poor fitting shoes, running on hard surfaces, being overweight, having an abnormal walk, or overuse (this is common in runners) of the painful foot or feet. It is also common in aerobic exercise dancers and ballet dancers.  SYMPTOMS   Most people with plantar fasciitis complain of:   Severe pain in the morning on the bottom of their foot especially when taking the first steps out of bed. This pain recedes after a few minutes of walking.   Severe pain is experienced also during walking following a long period of inactivity.   Pain is worse when walking barefoot or up stairs  DIAGNOSIS    Your caregiver will diagnose this condition by examining and feeling your foot.   Special tests such as X-rays of your foot, are usually not needed.  PREVENTION    Consult a sports medicine professional before beginning a new exercise program.   Walking programs offer a good workout. With walking there is a lower chance of overuse injuries common to runners. There is less impact and less jarring of the joints.   Begin all new exercise programs slowly. If problems or pain develop, decrease the amount of time or distance until you are at a comfortable level.   Wear good shoes and replace them regularly.   Stretch your foot and the heel cords at the back of the ankle (Achilles tendon) both before and after exercise.   Run or exercise on even surfaces that are not hard. For example, asphalt is better than pavement.   Do not run barefoot on hard surfaces.   If using a treadmill, vary the incline.   Do not continue to workout if you have foot or joint problems. Seek professional help if they do not improve.  HOME CARE INSTRUCTIONS     Avoid activities that cause you pain until you recover.   Use ice or cold packs on the problem or painful areas after working out.   Only take over-the-counter or prescription medicines for pain, discomfort, or fever as directed by your caregiver.   Soft shoe inserts or athletic shoes with air or gel sole cushions may be helpful.   If problems continue or become more severe, consult a sports medicine caregiver or your own health care provider. Cortisone is a potent anti-inflammatory medication that may be injected into the painful area. You can discuss this treatment with your caregiver.  MAKE SURE YOU:    Understand these instructions.   Will watch your condition.   Will get help right away if you are not doing well or get worse.  Document Released: 01/11/2001 Document Revised: 07/11/2011 Document Reviewed: 03/12/2008  Exit[employer] Patient Information 2015 [employer]. This information is not intended to replace advice given to you by your health care provider. Make sure you discuss any questions you have with your health care provider.

## 2014-01-14 ENCOUNTER — Ambulatory Visit: Payer: Medicaid Other

## 2014-01-17 ENCOUNTER — Ambulatory Visit (INDEPENDENT_AMBULATORY_CARE_PROVIDER_SITE_OTHER): Payer: Medicaid Other

## 2014-01-17 DIAGNOSIS — M79609 Pain in unspecified limb: Secondary | ICD-10-CM

## 2014-01-29 ENCOUNTER — Ambulatory Visit: Payer: Medicaid Other | Admitting: Podiatrist

## 2014-02-06 NOTE — Progress Notes (Signed)
Patient ID: Alice Martinez, female   DOB: 05/09/1975, 38 y.o.   MRN: [id number] Appointment was canceled

## 2014-10-08 ENCOUNTER — Encounter (HOSPITAL_BASED_OUTPATIENT_CLINIC_OR_DEPARTMENT_OTHER): Payer: Self-pay | Admitting: *Deleted

## 2014-10-08 ENCOUNTER — Emergency Department (HOSPITAL_BASED_OUTPATIENT_CLINIC_OR_DEPARTMENT_OTHER)
Admission: EM | Admit: 2014-10-08 | Discharge: 2014-10-08 | Discharge status: Against Medical Advice | Payer: Medicaid Other | Attending: Emergency Medicine | Admitting: Emergency Medicine

## 2014-10-08 DIAGNOSIS — Z720 Tobacco use: Secondary | ICD-10-CM | POA: Insufficient documentation

## 2014-10-08 DIAGNOSIS — R0981 Nasal congestion: Secondary | ICD-10-CM | POA: Diagnosis not present

## 2014-10-08 DIAGNOSIS — R52 Pain, unspecified: Secondary | ICD-10-CM | POA: Insufficient documentation

## 2014-10-08 DIAGNOSIS — I10 Essential (primary) hypertension: Secondary | ICD-10-CM | POA: Insufficient documentation

## 2014-10-08 DIAGNOSIS — R05 Cough: Secondary | ICD-10-CM | POA: Insufficient documentation

## 2014-10-08 NOTE — ED Notes (Signed)
No answer from waiting area x2

## 2014-10-08 NOTE — ED Notes (Signed)
Pt reports cough, congestion, and body aches x 2 days

## 2014-10-08 NOTE — ED Notes (Signed)
Pt to nse desk states with ? About wait time-advised unable to give accurate wait time due to pts are not seen based on time in dept but c/o and acuity-pt NAD

## 2014-12-06 ENCOUNTER — Encounter (HOSPITAL_BASED_OUTPATIENT_CLINIC_OR_DEPARTMENT_OTHER): Payer: Self-pay | Admitting: Emergency Medicine

## 2014-12-06 ENCOUNTER — Emergency Department (HOSPITAL_BASED_OUTPATIENT_CLINIC_OR_DEPARTMENT_OTHER)
Admission: EM | Admit: 2014-12-06 | Discharge: 2014-12-06 | Disposition: A | Discharge status: Home / Self Care | Payer: Medicaid Other | Attending: Emergency Medicine | Admitting: Emergency Medicine

## 2014-12-06 DIAGNOSIS — E780 Pure hypercholesterolemia: Secondary | ICD-10-CM | POA: Diagnosis not present

## 2014-12-06 DIAGNOSIS — X58XXXA Exposure to other specified factors, initial encounter: Secondary | ICD-10-CM | POA: Insufficient documentation

## 2014-12-06 DIAGNOSIS — S29012A Strain of muscle and tendon of back wall of thorax, initial encounter: Secondary | ICD-10-CM | POA: Diagnosis not present

## 2014-12-06 DIAGNOSIS — Z79899 Other long term (current) drug therapy: Secondary | ICD-10-CM | POA: Insufficient documentation

## 2014-12-06 DIAGNOSIS — Y9389 Activity, other specified: Secondary | ICD-10-CM | POA: Insufficient documentation

## 2014-12-06 DIAGNOSIS — S299XXA Unspecified injury of thorax, initial encounter: Secondary | ICD-10-CM | POA: Diagnosis present

## 2014-12-06 DIAGNOSIS — I10 Essential (primary) hypertension: Secondary | ICD-10-CM | POA: Diagnosis not present

## 2014-12-06 DIAGNOSIS — Z720 Tobacco use: Secondary | ICD-10-CM | POA: Diagnosis not present

## 2014-12-06 DIAGNOSIS — S29019A Strain of muscle and tendon of unspecified wall of thorax, initial encounter: Secondary | ICD-10-CM

## 2014-12-06 DIAGNOSIS — Y990 Civilian activity done for income or pay: Secondary | ICD-10-CM | POA: Diagnosis not present

## 2014-12-06 DIAGNOSIS — Y9289 Other specified places as the place of occurrence of the external cause: Secondary | ICD-10-CM | POA: Diagnosis not present

## 2014-12-06 MED ORDER — KETOROLAC TROMETHAMINE 60 MG/2ML IM SOLN
30.0000 mg | Freq: Once | INTRAMUSCULAR | Status: AC
  Administered 2014-12-06: 30 mg via INTRAMUSCULAR
  Filled 2014-12-06: qty 2

## 2014-12-06 MED ORDER — METHOCARBAMOL 500 MG PO TABS
500.0000 mg | ORAL_TABLET | Freq: Once | ORAL | Status: AC
  Administered 2014-12-06: 500 mg via ORAL
  Filled 2014-12-06: qty 1

## 2014-12-06 MED ORDER — HYDROCODONE-ACETAMINOPHEN 5-325 MG PO TABS
1.0000 | ORAL_TABLET | Freq: Once | ORAL | Status: AC
  Administered 2014-12-06: 1 via ORAL
  Filled 2014-12-06: qty 1

## 2014-12-06 MED ORDER — HYDROCODONE-ACETAMINOPHEN 5-325 MG PO TABS: ORAL_TABLET | ORAL | Status: DC

## 2014-12-06 MED ORDER — KETOROLAC TROMETHAMINE 30 MG/ML IJ SOLN
INTRAMUSCULAR | Status: DC
Start: 2014-12-06 — End: 2014-12-07
  Filled 2014-12-06: qty 1

## 2014-12-06 MED ORDER — METHOCARBAMOL 500 MG PO TABS: 500.0000 mg | ORAL_TABLET | Freq: Two times a day (BID) | ORAL | Status: DC | PRN

## 2014-12-06 NOTE — ED Provider Notes (Signed)
CSN: [id number]   Arrival date & time 12/06/14 2138  History  This chart was scribed for non-physician practitioner, Wynetta Emery PA-C, working with Rolland Porter, MD by Bethel Born, ED Scribe. This patient was seen in room MH07/MH07 and the patient's care was started at 10:40 PM.   Chief Complaint  Patient presents with  . Back Pain    HPI The history is provided by the patient. No language interpreter was used.   Alice Martinez is a 39 y.o. female who presents to the Emergency Department complaining of constant atraumatic upper back pain with sudden onset yesterday at work. At initial onset the pain took her breath away. The pain is described as sharp and rated 8/10 in severity. Movement exacerbates the pain. Aleve, Bengay and heat have provided insufficient pain relief PTA. Also complains of mild dry cough. Pt denies fever, current SOB, palpations, and LE swelling or pain. No history of PE/DVT. No recent travel, surgery, or immobilization. No hormonal birth control use. Denies pregnancy.   Past Medical History  Diagnosis Date  . Hypertension   . High cholesterol     Past Surgical History  Procedure Laterality Date  . Tonsillectomy      History reviewed. No pertinent family history.  History  Substance Use Topics  . Smoking status: Current Every Day Smoker    Types: Cigarettes  . Smokeless tobacco: Never Used  . Alcohol Use: Yes     Comment: monthly     Review of Systems 10 Systems reviewed and all are negative for acute change except as noted in the HPI.  Home Medications   Prior to Admission medications   Medication Sig Start Date End Date Taking? Authorizing Provider  escitalopram (LEXAPRO) 20 MG tablet Take 20 mg by mouth daily.    Historical Provider, MD  hydrochlorothiazide (HYDRODIURIL) 25 MG tablet Take 25 mg by mouth daily.    Historical Provider, MD  HYDROcodone-acetaminophen (NORCO/VICODIN) 5-325 MG per tablet Take 2 tablets by mouth every 4 (four) hours as  needed. 01/12/14   Elson Areas, PA-C  ibuprofen (ADVIL,MOTRIN) 800 MG tablet Take 1 tablet (800 mg total) by mouth 3 (three) times daily. 01/12/14   Elson Areas, PA-C  simvastatin (ZOCOR) 20 MG tablet Take 20 mg by mouth daily.    Historical Provider, MD    Allergies  Review of patient's allergies indicates no known allergies.  Triage Vitals: BP 132/91 mmHg  Pulse 93  Temp(Src) 98.2 F (36.8 C) (Oral)  Resp 20  Ht  (1.549 m)  Wt 160 lb (72.576 kg)  BMI 30.25 kg/m2  SpO2 100%  LMP 12/01/2014  Physical Exam  Constitutional: She is oriented to person, place, and time. She appears well-developed and well-nourished. No distress.  HENT:  Head: Normocephalic.  Mouth/Throat: Oropharynx is clear and moist.  Eyes: Conjunctivae are normal.  Neck: Normal range of motion. No JVD present. No tracheal deviation present.  Cardiovascular: Normal rate, regular rhythm and intact distal pulses.   Radial pulse equal bilaterally  Pulmonary/Chest: Effort normal and breath sounds normal. No stridor. No respiratory distress. She has no wheezes. She has no rales.   She exhibits tenderness.  Abdominal: Soft. She exhibits no distension and no mass. There is no tenderness. There is no rebound and no guarding.  Musculoskeletal: Normal range of motion. She exhibits no edema or tenderness.  No calf asymmetry, superficial collaterals, palpable cords, edema, Homans sign negative bilaterally.    Neurological: She is alert and oriented  to person, place, and time.  Skin: Skin is warm. She is not diaphoretic.  Psychiatric: She has a normal mood and affect.  Nursing note and vitals reviewed.   ED Course  Procedures   DIAGNOSTIC STUDIES: Oxygen Saturation is 100% on RA, normal by my interpretation.    COORDINATION OF CARE: 10:44 PM Discussed treatment plan which includes pain management with pt at bedside and pt agreed to plan.  Labs Review- Labs Reviewed - No data to display  Imaging Review No  results found.  EKG Interpretation None      MDM   Final diagnoses:  None     Filed Vitals:   12/06/14 2145 12/06/14 2257  BP: 132/91 137/81  Pulse: 93 90  Temp: 98.2 F (36.8 C)   TempSrc: Oral   Resp: 20 16  Height: 5\' 1"  (1.549 m)   Weight: 160 lb (72.576 kg)   SpO2: 100% 97%    Medications  ketorolac (TORADOL) 30 MG/ML injection (not administered)  ketorolac (TORADOL) injection 30 mg (30 mg Intramuscular Given 12/06/14 2254)  HYDROcodone-acetaminophen (NORCO/VICODIN) 5-325 MG per tablet 1 tablet (1 tablet Oral Given 12/06/14 2256)  methocarbamol (ROBAXIN) tablet 500 mg (500 mg Oral Given 12/06/14 2256)    Alice Martinez is a pleasant 39 y.o. female presenting with aching in the right thoracic region onset yesterday. Pain is relieved with hydrocodone. Lung sounds are clear to auscultation. No shortness of breath, palpitations, tachycardia, tachypnea. Patient is low risk by Wells criteria and PERC negative. Pain is positional, likely musculoskeletal strain.   Evaluation does not show pathology that would require ongoing emergent intervention or inpatient treatment. Pt is hemodynamically stable and mentating appropriately. Discussed findings and plan with patient/guardian, who agrees with care plan. All questions answered. Return precautions discussed and outpatient follow up given.   Discharge Medication List as of 12/06/2014 10:49 PM    START taking these medications   Details  methocarbamol (ROBAXIN) 500 MG tablet Take 1 tablet (500 mg total) by mouth 2 (two) times daily as needed for muscle spasms., Starting 12/06/2014, Until Discontinued, Print        I personally performed the services described in this documentation, which was scribed in my presence. The recorded information has been reviewed and is accurate.     Wynetta Emery, PA-C 12/06/14 7829  Rolland Porter, MD 12/15/14 [phone]

## 2014-12-06 NOTE — ED Notes (Addendum)
Pt in c/o mid thoracic back pain onset at work yesterday, states worse today but in same location, home treatment has not been effective.

## 2014-12-06 NOTE — Discharge Instructions (Signed)
Please take ibuprofen 400mg (this is normally 2 over the counter pills) every 6 hours (take with food to minimze stomach irritation).  ° °Take robaxin and/or Vicodin for breakthrough pain, do not drink alcohol, drive, care for children or perfom other critical tasks while taking robaxin and/or Vicodin . ° °Please follow with your primary care doctor in the next 2 days for a check-up. They must obtain records for further management.  ° °Do not hesitate to return to the Emergency Department for any new, worsening or concerning symptoms.  ° °

## 2014-12-06 NOTE — ED Notes (Signed)
Patient sitting in the waiting room eating with out any distress noted.

## 2015-06-01 ENCOUNTER — Encounter (HOSPITAL_COMMUNITY): Payer: Self-pay | Admitting: *Deleted

## 2015-06-01 ENCOUNTER — Emergency Department (HOSPITAL_COMMUNITY)
Admission: EM | Admit: 2015-06-01 | Discharge: 2015-06-01 | Disposition: A | Discharge status: Home / Self Care | Payer: Medicaid Other | Attending: Emergency Medicine | Admitting: Emergency Medicine

## 2015-06-01 DIAGNOSIS — R202 Paresthesia of skin: Secondary | ICD-10-CM | POA: Diagnosis not present

## 2015-06-01 DIAGNOSIS — Z79899 Other long term (current) drug therapy: Secondary | ICD-10-CM | POA: Insufficient documentation

## 2015-06-01 DIAGNOSIS — M546 Pain in thoracic spine: Secondary | ICD-10-CM | POA: Diagnosis not present

## 2015-06-01 DIAGNOSIS — E7800 Pure hypercholesterolemia, unspecified: Secondary | ICD-10-CM | POA: Diagnosis not present

## 2015-06-01 DIAGNOSIS — R200 Anesthesia of skin: Secondary | ICD-10-CM | POA: Insufficient documentation

## 2015-06-01 DIAGNOSIS — Z791 Long term (current) use of non-steroidal anti-inflammatories (NSAID): Secondary | ICD-10-CM | POA: Insufficient documentation

## 2015-06-01 DIAGNOSIS — I10 Essential (primary) hypertension: Secondary | ICD-10-CM | POA: Insufficient documentation

## 2015-06-01 DIAGNOSIS — F17210 Nicotine dependence, cigarettes, uncomplicated: Secondary | ICD-10-CM | POA: Insufficient documentation

## 2015-06-01 MED ORDER — METHOCARBAMOL 500 MG PO TABS: 500.0000 mg | ORAL_TABLET | Freq: Two times a day (BID) | ORAL | Status: DC

## 2015-06-01 MED ORDER — KETOROLAC TROMETHAMINE 60 MG/2ML IM SOLN
30.0000 mg | Freq: Once | INTRAMUSCULAR | Status: AC
  Administered 2015-06-01: 30 mg via INTRAMUSCULAR
  Filled 2015-06-01: qty 2

## 2015-06-01 MED ORDER — ACETAMINOPHEN 500 MG PO TABS
1000.0000 mg | ORAL_TABLET | Freq: Once | ORAL | Status: AC
  Administered 2015-06-01: 1000 mg via ORAL
  Filled 2015-06-01: qty 2

## 2015-06-01 MED ORDER — PREDNISONE 20 MG PO TABS: 40.0000 mg | ORAL_TABLET | Freq: Every day | ORAL | Status: DC

## 2015-06-01 MED ORDER — NAPROXEN 500 MG PO TABS: 500.0000 mg | ORAL_TABLET | Freq: Two times a day (BID) | ORAL | Status: DC

## 2015-06-01 MED ORDER — PREDNISONE 20 MG PO TABS
40.0000 mg | ORAL_TABLET | Freq: Once | ORAL | Status: AC
  Administered 2015-06-01: 40 mg via ORAL
  Filled 2015-06-01: qty 2

## 2015-06-01 NOTE — ED Notes (Signed)
Pt has pain to upper back and between shoulder blades that is worse but the pain has been on going for 2 months.. Pt reports her PCP gave her valium this has not helped.Pt reports her job requires  Heavy usage of upper ext.

## 2015-06-01 NOTE — ED Notes (Signed)
Declined W/C at D/C and was escorted to lobby by RN. 

## 2015-06-01 NOTE — ED Provider Notes (Signed)
CSN: [id number]     Arrival date & time 06/01/15  1340 History  By signing my name below, I, [name], attest that this documentation has been prepared under the direction and in the presence of Lanisa Ishler, [place].  Electronically Signed: [name], ED Scribe 06/01/2015 at 2:38 PM.   Chief Complaint  Patient presents with  . Back Pain    The history is provided by the patient. No language interpreter was used.   HPI Comments: Alice Martinez is a 40 y.o. female who presents to the Emergency Department complaining of gradual onset, constant upper back pain onset 1-2 months ago, worsening in the past few days. Pt reports pain radiates into her shoulder blades, neck, and occasionally into arms and hands. She also notes tingling and numbness in hands with the radiating pain. She states that she has seen her PCP where she was prescribed valium with mild relief, but did not receive a back X-ray. Pt has been taking BC powders at home with no relief. Pt denies fever, chills, bowel/urinary incontinence, and any other pertinent symptoms.   Past Medical History  Diagnosis Date  . Hypertension   . High cholesterol    Past Surgical History  Procedure Laterality Date  . Tonsillectomy     No family history on file. Social History  Substance Use Topics  . Smoking status: Current Every Day Smoker    Types: Cigarettes  . Smokeless tobacco: Never Used  . Alcohol Use: Yes     Comment: monthly   OB History    No data available     Review of Systems  Constitutional: Negative for fever and chills.  Musculoskeletal: Positive for back pain.  All other systems reviewed and are negative.   Allergies  Review of patient's allergies indicates no known allergies.  Home Medications   Prior to Admission medications   Medication Sig Start Date End Date Taking? Authorizing Provider  escitalopram (LEXAPRO) 20 MG tablet Take 20 mg by mouth daily.    Historical Provider, MD   hydrochlorothiazide (HYDRODIURIL) 25 MG tablet Take 25 mg by mouth daily.    Historical Provider, MD  HYDROcodone-acetaminophen (NORCO/VICODIN) 5-325 MG per tablet Take 1-2 tablets by mouth every 6 hours as needed for pain and/or cough. 12/06/14   Nicole Pisciotta, PA-C  ibuprofen (ADVIL,MOTRIN) 800 MG tablet Take 1 tablet (800 mg total) by mouth 3 (three) times daily. 01/12/14   Elson Areas, PA-C  methocarbamol (ROBAXIN) 500 MG tablet Take 1 tablet (500 mg total) by mouth 2 (two) times daily as needed for muscle spasms. 12/06/14   Nicole Pisciotta, PA-C  simvastatin (ZOCOR) 20 MG tablet Take 20 mg by mouth daily.    Historical Provider, MD   BP 132/72 mmHg  Pulse 74  Temp(Src) 97.4 F (36.3 C) (Oral)  Resp 20  SpO2 99%  LMP 05/11/2015 Physical Exam  Constitutional: She is oriented to person, place, and time. She appears well-developed and well-nourished.  HENT:  Head: Normocephalic.  Eyes: EOM are normal.  Neck: Normal range of motion.  Pulmonary/Chest: Effort normal.  Abdominal: She exhibits no distension.  Musculoskeletal: Normal range of motion.  Diffuse thoracic bilateral paraspinal ttp with spasm. No spinal tenderness. UE and LE strength and sensation intact.  Neurological: She is alert and oriented to person, place, and time.  Psychiatric: She has a normal mood and affect.  Nursing note and vitals reviewed.   ED Course  Procedures (including critical care time) DIAGNOSTIC STUDIES: Oxygen Saturation is  99% on RA, normal by my interpretation.    COORDINATION OF CARE:  1:59 PM-Discussed treatment plan which includes Toradol injection, deltasone, and tylenol with pt at bedside and pt agreed to plan.    Labs Review Labs Reviewed - No data to display  Imaging Review No results found.   EKG Interpretation None      MDM   Final diagnoses:  Bilateral thoracic back pain  pt is an 40 y.o. female with chronic thoracic back pain. Her exam reveals diffuse thoracic soft  tissue tenderness and spasm but no spinal tenderness. No red flag symptoms for cauda equina, epidural abscess, or other acute spinal pathology. No indication for emergent x-ray or spinal MRI. disucssed with pt she might consider outpatient MRI with her PCP. She has f/u scheduled for later this week. In the meantime rx given for robaxin, prednisone, and NSAIDs. Toradol, prednisone, and tylenol given in the ED with improvement in pain. ER return precautions given.  I personally performed the services described in this documentation, which was scribed in my presence. The recorded information has been reviewed and is accurate.    Carlene Coria, PA-C 06/01/15 1525  Doug Sou, MD 06/01/15 1755

## 2015-06-01 NOTE — Discharge Instructions (Signed)
You were seen in the emergency room today for evaluation of back pain. As we discussed, your pain appears to be related to the soft tissue and an x-ray is not indicated at this time. No emergent spinal MRI indicated today either but I would discuss an outpatient MRI with your primary care provider if your symptoms continue. In the meantime I will give you prescriptions for a muscle relaxer, steroids, and pain medicine to help with your symptoms. Please follow up with your primary care provider this week as scheduled. Return to the ER for new or worsening symptoms.

## 2015-06-03 ENCOUNTER — Emergency Department (HOSPITAL_BASED_OUTPATIENT_CLINIC_OR_DEPARTMENT_OTHER)
Admission: EM | Admit: 2015-06-03 | Discharge: 2015-06-03 | Disposition: A | Discharge status: Home / Self Care | Payer: Medicaid Other | Attending: Emergency Medicine | Admitting: Emergency Medicine

## 2015-06-03 ENCOUNTER — Encounter (HOSPITAL_BASED_OUTPATIENT_CLINIC_OR_DEPARTMENT_OTHER): Payer: Self-pay | Admitting: Emergency Medicine

## 2015-06-03 DIAGNOSIS — M79602 Pain in left arm: Secondary | ICD-10-CM

## 2015-06-03 DIAGNOSIS — M79621 Pain in right upper arm: Secondary | ICD-10-CM | POA: Insufficient documentation

## 2015-06-03 DIAGNOSIS — M546 Pain in thoracic spine: Secondary | ICD-10-CM

## 2015-06-03 DIAGNOSIS — I10 Essential (primary) hypertension: Secondary | ICD-10-CM | POA: Diagnosis not present

## 2015-06-03 DIAGNOSIS — Z79899 Other long term (current) drug therapy: Secondary | ICD-10-CM | POA: Diagnosis not present

## 2015-06-03 DIAGNOSIS — F17210 Nicotine dependence, cigarettes, uncomplicated: Secondary | ICD-10-CM | POA: Insufficient documentation

## 2015-06-03 DIAGNOSIS — M542 Cervicalgia: Secondary | ICD-10-CM | POA: Insufficient documentation

## 2015-06-03 DIAGNOSIS — M62830 Muscle spasm of back: Secondary | ICD-10-CM | POA: Insufficient documentation

## 2015-06-03 DIAGNOSIS — M79622 Pain in left upper arm: Secondary | ICD-10-CM | POA: Diagnosis not present

## 2015-06-03 DIAGNOSIS — Z791 Long term (current) use of non-steroidal anti-inflammatories (NSAID): Secondary | ICD-10-CM | POA: Diagnosis not present

## 2015-06-03 DIAGNOSIS — R202 Paresthesia of skin: Secondary | ICD-10-CM | POA: Insufficient documentation

## 2015-06-03 DIAGNOSIS — F329 Major depressive disorder, single episode, unspecified: Secondary | ICD-10-CM | POA: Insufficient documentation

## 2015-06-03 DIAGNOSIS — M79601 Pain in right arm: Secondary | ICD-10-CM

## 2015-06-03 DIAGNOSIS — E7800 Pure hypercholesterolemia, unspecified: Secondary | ICD-10-CM | POA: Insufficient documentation

## 2015-06-03 DIAGNOSIS — Z7952 Long term (current) use of systemic steroids: Secondary | ICD-10-CM | POA: Diagnosis not present

## 2015-06-03 HISTORY — DX: Attention-deficit hyperactivity disorder, unspecified type: F90.9

## 2015-06-03 HISTORY — DX: Depression, unspecified: F32.A

## 2015-06-03 HISTORY — DX: Major depressive disorder, single episode, unspecified: F32.9

## 2015-06-03 NOTE — Discharge Instructions (Signed)
Back Pain: Your back pain should be treated with medicines such as ibuprofen or aleve and this back pain should get better over the next 2 weeks.  However if you develop severe or worsening pain, back pain with fever, numbness, weakness or inability to walk or urinate, you should return to the ER immediately.  Please follow up with your doctor this week for a recheck if still having symptoms.  Avoid heavy lifting over 10 pounds over the next two weeks.  Back pain is discomfort in the back that may be due to injuries to muscles and ligaments around the spine.  Occasionally, it may be caused by a a problem to a part of the spine called a disc.  The pain may last several days or a week;  However, most patients get completely well in 4 weeks.  Self - care:  The application of heat can help soothe the pain.  Maintaining your daily activities, including walking, is encourged, as it will help you get better faster than just staying in bed. Perform gentle stretching as discussed. Drink plenty of fluids.  Medications are also useful to help with pain control.  A commonly prescribed medication includes tylenol.  Non steroidal anti inflammatory medications including Ibuprofen and naproxen;  These medications help both pain and swelling and are very useful in treating back pain.  They should be taken with food, as they can cause stomach upset, and more seriously, stomach bleeding.    Muscle relaxants:  These medications can help with muscle tightness that is a cause of lower back pain.  Most of these medications can cause drowsiness, and it is not safe to drive or use dangerous machinery while taking them.  Prednisone: this helps with the tingling sensation, take in the morning with breakfast  SEEK IMMEDIATE MEDICAL ATTENTION IF: New numbness, tingling, weakness, or problem with the use of your arms or legs.  Severe back pain not relieved with medications.  Difficulty with or loss of control of your bowel or  bladder control.  Increasing pain in any areas of the body (such as chest or abdominal pain).  Shortness of breath, dizziness or fainting.  Nausea (feeling sick to your stomach), vomiting, fever, or sweats.  You will need to follow up with  Your primary healthcare provider in 1-2 weeks for reassessment, and possible outpatient MRI   Back Pain, Adult Back pain is very common. The pain often gets better over time. The cause of back pain is usually not dangerous. Most people can learn to manage their back pain on their own.  HOME CARE  Watch your back pain for any changes. The following actions may help to lessen any pain you are feeling:  Stay active. Start with short walks on flat ground if you can. Try to walk farther each day.  Exercise regularly as told by your doctor. Exercise helps your back heal faster. It also helps avoid future injury by keeping your muscles strong and flexible.  Do not sit, drive, or stand in one place for more than 30 minutes.  Do not stay in bed. Resting more than 1-2 days can slow down your recovery.  Be careful when you bend or lift an object. Use good form when lifting:  Bend at your knees.  Keep the object close to your body.  Do not twist.  Sleep on a firm mattress. Lie on your side, and bend your knees. If you lie on your back, put a pillow under your knees.  Take medicines only as told by your doctor.  Put ice on the injured area.  Put ice in a plastic bag.  Place a towel between your skin and the bag.  Leave the ice on for 20 minutes, 2-3 times a day for the first 2-3 days. After that, you can switch between ice and heat packs.  Avoid feeling anxious or stressed. Find good ways to deal with stress, such as exercise.  Maintain a healthy weight. Extra weight puts stress on your back. GET HELP IF:   You have pain that does not go away with rest or medicine.  You have worsening pain that goes down into your legs or buttocks.  You have  pain that does not get better in one week.  You have pain at night.  You lose weight.  You have a fever or chills. GET HELP RIGHT AWAY IF:   You cannot control when you poop (bowel movement) or pee (urinate).  Your arms or legs feel weak.  Your arms or legs lose feeling (numbness).  You feel sick to your stomach (nauseous) or throw up (vomit).  You have belly (abdominal) pain.  You feel like you may pass out (faint).   This information is not intended to replace advice given to you by your health care provider. Make sure you discuss any questions you have with your health care provider.   Document Released: 10/05/2007 Document Revised: 05/09/2014 Document Reviewed: 08/20/2013 Elsevier Interactive Patient Education 2016 [place].  Back Exercises If you have pain in your back, do these exercises 2-3 times each day or as told by your doctor. When the pain goes away, do the exercises once each day, but repeat the steps more times for each exercise (do more repetitions). If you do not have pain in your back, do these exercises once each day or as told by your doctor. EXERCISES Single Knee to Chest Do these steps 3-5 times in a row for each leg:  Lie on your back on a firm bed or the floor with your legs stretched out.  Bring one knee to your chest.  Hold your knee to your chest by grabbing your knee or thigh.  Pull on your knee until you feel a gentle stretch in your lower back.  Keep doing the stretch for 10-30 seconds.  Slowly let go of your leg and straighten it. Pelvic Tilt Do these steps 5-10 times in a row:  Lie on your back on a firm bed or the floor with your legs stretched out.  Bend your knees so they point up to the ceiling. Your feet should be flat on the floor.  Tighten your lower belly (abdomen) muscles to press your lower back against the floor. This will make your tailbone point up to the ceiling instead of pointing down to your feet or the  floor.  Stay in this position for 5-10 seconds while you gently tighten your muscles and breathe evenly. Cat-Cow Do these steps until your lower back bends more easily:  Get on your hands and knees on a firm surface. Keep your hands under your shoulders, and keep your knees under your hips. You may put padding under your knees.  Let your head hang down, and make your tailbone point down to the floor so your lower back is round like the back of a cat.  Stay in this position for 5 seconds.  Slowly lift your head and make your tailbone point up to the ceiling so  your back hangs low (sags) like the back of a cow.  Stay in this position for 5 seconds. Press-Ups Do these steps 5-10 times in a row:  Lie on your belly (face-down) on the floor.  Place your hands near your head, about shoulder-width apart.  While you keep your back relaxed and keep your hips on the floor, slowly straighten your arms to raise the top half of your body and lift your shoulders. Do not use your back muscles. To make yourself more comfortable, you may change where you place your hands.  Stay in this position for 5 seconds.  Slowly return to lying flat on the floor. Bridges Do these steps 10 times in a row:  Lie on your back on a firm surface.  Bend your knees so they point up to the ceiling. Your feet should be flat on the floor.  Tighten your butt muscles and lift your butt off of the floor until your waist is almost as high as your knees. If you do not feel the muscles working in your butt and the back of your thighs, slide your feet 1-2 inches farther away from your butt.  Stay in this position for 3-5 seconds.  Slowly lower your butt to the floor, and let your butt muscles relax. If this exercise is too easy, try doing it with your arms crossed over your chest. Belly Crunches Do these steps 5-10 times in a row:  Lie on your back on a firm bed or the floor with your legs stretched out.  Bend your  knees so they point up to the ceiling. Your feet should be flat on the floor.  Cross your arms over your chest.  Tip your chin a little bit toward your chest but do not bend your neck.  Tighten your belly muscles and slowly raise your chest just enough to lift your shoulder blades a tiny bit off of the floor.  Slowly lower your chest and your head to the floor. Back Lifts Do these steps 5-10 times in a row: 1. Lie on your belly (face-down) with your arms at your sides, and rest your forehead on the floor. 2. Tighten the muscles in your legs and your butt. 3. Slowly lift your chest off of the floor while you keep your hips on the floor. Keep the back of your head in line with the curve in your back. Look at the floor while you do this. 4. Stay in this position for 3-5 seconds. 5. Slowly lower your chest and your face to the floor. GET HELP IF:  Your back pain gets a lot worse when you do an exercise.  Your back pain does not lessen 2 hours after you exercise. If you have any of these problems, stop doing the exercises. Do not do them again unless your doctor says it is okay. GET HELP RIGHT AWAY IF:  You have sudden, very bad back pain. If this happens, stop doing the exercises. Do not do them again unless your doctor says it is okay.   This information is not intended to replace advice given to you by your health care provider. Make sure you discuss any questions you have with your health care provider.   Document Released: 05/21/2010 Document Revised: 01/07/2015 Document Reviewed: 06/12/2014 Elsevier Interactive Patient Education 2016 [place] Injury Prevention Back injuries can be very painful. They can also be difficult to heal. After having one back injury, you are more likely to injure  your back again. It is important to learn how to avoid injuring or re-injuring your back. The following tips can help you to prevent a back injury. WHAT SHOULD I KNOW ABOUT PHYSICAL  FITNESS?  Exercise for 30 minutes per day on most days of the week or as told by your doctor. Make sure to:  Do aerobic exercises, such as walking, jogging, biking, or swimming.  Do exercises that increase balance and strength, such as tai chi and yoga.  Do stretching exercises. This helps with flexibility.  Try to develop strong belly (abdominal) muscles. Your belly muscles help to support your back.  Stay at a healthy weight. This helps to decrease your risk of a back injury. WHAT SHOULD I KNOW ABOUT MY DIET?  Talk with your doctor about your overall diet. Take supplements and vitamins only as told by your doctor.  Talk with your doctor about how much calcium and vitamin D you need each day. These nutrients help to prevent weakening of the bones (osteoporosis).  Include good sources of calcium in your diet, such as:  Dairy products.  Green leafy vegetables.  Products that have had calcium added to them (fortified).  Include good sources of vitamin D in your diet, such as:  Milk.  Foods that have had vitamin D added to them. WHAT SHOULD I KNOW ABOUT MY POSTURE?  Sit up straight and stand up straight. Avoid leaning forward when you sit or hunching over when you stand.  Choose chairs that have good low-back (lumbar) support.  If you work at a desk, sit close to it so you do not need to lean over. Keep your chin tucked in. Keep your neck drawn back. Keep your elbows bent so your arms look like the letter "L" (right angle).  Sit high and close to the steering wheel when you drive. Add a low-back support to your car seat, if needed.  Avoid sitting or standing in one position for very long. Take breaks to get up, stretch, and walk around at least one time every hour. Take breaks every hour if you are driving for long periods of time.  Sleep on your side with your knees slightly bent, or sleep on your back with a pillow under your knees. Do not lie on the front of your body to  sleep. WHAT SHOULD I KNOW ABOUT LIFTING, TWISTING, AND REACHING Lifting and Heavy Lifting  Avoid heavy lifting, especially lifting over and over again. If you must do heavy lifting:  Stretch before lifting.  Work slowly.  Rest between lifts.  Use a tool such as a cart or a dolly to move objects if one is available.  Make several small trips instead of carrying one heavy load.  Ask for help when you need it, especially when moving big objects.  Follow these steps when lifting:  Stand with your feet shoulder-width apart.  Get as close to the object as you can. Do not pick up a heavy object that is far from your body.  Use handles or lifting straps if they are available.  Bend at your knees. Squat down, but keep your heels off the floor.  Keep your shoulders back. Keep your chin tucked in. Keep your back straight.  Lift the object slowly while you tighten the muscles in your legs, belly, and butt. Keep the object as close to the center of your body as possible.  Follow these steps when putting down a heavy load:  Stand with your feet shoulder-width  apart.  Lower the object slowly while you tighten the muscles in your legs, belly, and butt. Keep the object as close to the center of your body as possible.  Keep your shoulders back. Keep your chin tucked in. Keep your back straight.  Bend at your knees. Squat down, but keep your heels off the floor.  Use handles or lifting straps if they are available. Twisting and Reaching  Avoid lifting heavy objects above your waist.  Do not twist at your waist while you are lifting or carrying a load. If you need to turn, move your feet.  Do not bend over without bending at your knees.  Avoid reaching over your head, across a table, or for an object on a high surface.  WHAT ARE SOME OTHER TIPS?  Avoid wet floors and icy ground. Keep sidewalks clear of ice to prevent falls.   Do not sleep on a mattress that is too soft or too  hard.   Keep items that you use often within easy reach.   Put heavier objects on shelves at waist level, and put lighter objects on lower or higher shelves.  Find ways to lower your stress, such as:  Exercise.  Massage.  Relaxation techniques.  Talk with your doctor if you feel anxious or depressed. These conditions can make back pain worse.  Wear flat heel shoes with cushioned soles.  Avoid making quick (sudden) movements.  Use both shoulder straps when carrying a backpack.  Do not use any tobacco products, including cigarettes, chewing tobacco, or electronic cigarettes. If you need help quitting, ask your doctor.   This information is not intended to replace advice given to you by your health care provider. Make sure you discuss any questions you have with your health care provider.   Document Released: 10/05/2007 Document Revised: 09/02/2014 Document Reviewed: 04/22/2014 Elsevier Interactive Patient Education 2016 [place] therapy can help ease sore, stiff, injured, and tight muscles and joints. Heat relaxes your muscles, which may help ease your pain.  RISKS AND COMPLICATIONS If you have any of the following conditions, do not use heat therapy unless your health care provider has approved:  Poor circulation.  Healing wounds or scarred skin in the area being treated.  Diabetes, heart disease, or high blood pressure.  Not being able to feel (numbness) the area being treated.  Unusual swelling of the area being treated.  Active infections.  Blood clots.  Cancer.  Inability to communicate pain. This may include young children and people who have problems with their brain function (dementia).  Pregnancy. Heat therapy should only be used on old, pre-existing, or long-lasting (chronic) injuries. Do not use heat therapy on new injuries unless directed by your health care provider. HOW TO USE HEAT THERAPY There are several different kinds  of heat therapy, including:  Moist heat pack.  Warm water bath.  Hot water bottle.  Electric heating pad.  Heated gel pack.  Heated wrap.  Electric heating pad. Use the heat therapy method suggested by your health care provider. Follow your health care provider's instructions on when and how to use heat therapy. GENERAL HEAT THERAPY RECOMMENDATIONS  Do not sleep while using heat therapy. Only use heat therapy while you are awake.  Your skin may turn pink while using heat therapy. Do not use heat therapy if your skin turns red.  Do not use heat therapy if you have new pain.  High heat or long exposure to heat can  cause burns. Be careful when using heat therapy to avoid burning your skin.  Do not use heat therapy on areas of your skin that are already irritated, such as with a rash or sunburn. SEEK MEDICAL CARE IF:  You have blisters, redness, swelling, or numbness.  You have new pain.  Your pain is worse. MAKE SURE YOU:  Understand these instructions.  Will watch your condition.  Will get help right away if you are not doing well or get worse.   This information is not intended to replace advice given to you by your health care provider. Make sure you discuss any questions you have with your health care provider.   Document Released: 07/11/2011 Document Revised: 05/09/2014 Document Reviewed: 06/11/2013 Elsevier Interactive Patient Education 2016 [employer]  Muscle Cramps and Spasms Muscle cramps and spasms are when muscles tighten by themselves. They usually get better within minutes. Muscle cramps are painful. They are usually stronger and last longer than muscle spasms. Muscle spasms may or may not be painful. They can last a few seconds or much longer. HOME CARE  Drink enough fluid to keep your pee (urine) clear or pale yellow.  Massage, stretch, and relax the muscle.  Use a warm towel, heating pad, or warm shower water on tight muscles.  Place ice on  the muscle if it is tender or in pain.  Put ice in a plastic bag.  Place a towel between your skin and the bag.  Leave the ice on for 15-20 minutes, 03-04 times a day.  Only take medicine as told by your doctor. GET HELP RIGHT AWAY IF:  Your cramps or spasms get worse, happen more often, or do not get better with time. MAKE SURE YOU:  Understand these instructions.  Will watch your condition.  Will get help right away if you are not doing well or get worse.   This information is not intended to replace advice given to you by your health care provider. Make sure you discuss any questions you have with your health care provider.   Document Released: 03/31/2008 Document Revised: 08/13/2012 Document Reviewed: 04/04/2012 Elsevier Interactive Patient Education 2016 [employer]  Paresthesia Paresthesia is a burning or prickling feeling. This feeling can happen in any part of the body. It often happens in the hands, arms, legs, or feet. Usually, it is not painful. In most cases, the feeling goes away in a short time and is not a sign of a serious problem. HOME CARE  Avoid drinking alcohol.  Try massage or needle therapy (acupuncture) to help with your problems.  Keep all follow-up visits as told by your doctor. This is important. GET HELP IF:  You keep on having episodes of paresthesia.  Your burning or prickling feeling gets worse when you walk.  You have pain or cramps.  You feel dizzy.  You have a rash. GET HELP RIGHT AWAY IF:  You feel weak.  You have trouble walking or moving.  You have problems speaking, understanding, or seeing.  You feel confused.  You cannot control when you pee (urinate) or poop (bowel movement).  You lose feeling (numbness) after an injury.  You pass out (faint).   This information is not intended to replace advice given to you by your health care provider. Make sure you discuss any questions you have with your health care provider.    Document Released: 03/31/2008 Document Revised: 09/02/2014 Document Reviewed: 04/14/2014 Elsevier Interactive Patient Education [employer].

## 2015-06-03 NOTE — ED Provider Notes (Signed)
CSN: [id number]     Arrival date & time 06/03/15  1725 History   First MD Initiated Contact with Patient 06/03/15 1754     Chief Complaint  Patient presents with  . Back Pain     (Consider location/radiation/quality/duration/timing/severity/associated sxs/prior Treatment) HPI Comments: Alice Martinez is a 40 y.o. female with a PMHx of HTN, HLD, and chronic upper back pain, who presents to the ED with complaints of ongoing thoracic back pain 3-4 months. Patient was seen 2 days ago [place] and given prednisone, Robaxin, and Naprosyn which she states has not helped, and states that the prednisone is "making her crazy". 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 Associated symptoms included tingling intermittently in her hands and back. She states she has to wake up at times and shake her arms out to get the tingling to stop.  She denies any fevers, chills, headache, vision changes, chest pain, shortness breath, abdominal pain, nausea, vomiting, diarrhea, constipation, dysuria, hematuria, incontinence of urine or stool, saddle anesthesia or cauda equina symptoms, numbness, or focal weakness. She denies any history of cancer, IV drug use, or injuries.  Patient is a 40 y.o. female presenting with back pain. The history is provided by the patient and medical records. No language interpreter was used.  Back Pain Location:  Thoracic spine Quality:  Aching Radiates to: b/l arms. Pain severity:  Moderate Pain is:  Same all the time Onset quality:  Gradual Duration:  4  months Timing:  Constant Progression:  Waxing and waning Chronicity:  Chronic Context: physical stress   Relieved by:  Cold packs and bed rest (valium, rest, and ice) Worsened by:  Movement Ineffective treatments:  Muscle relaxants and NSAIDs (and prednisone) Associated symptoms: tingling   Associated symptoms: no abdominal pain, no bladder incontinence, no bowel incontinence, no chest pain, no dysuria, no fever, no headaches, no numbness, no perianal numbness and no weakness   Risk factors: no hx of cancer     Past Medical History  Diagnosis Date  . Hypertension   . High cholesterol   . Depression   . ADHD (attention deficit hyperactivity disorder)    Past Surgical History  Procedure Laterality Date  . Tonsillectomy     No family history on file. Social History  Substance Use Topics  . Smoking status: Current Every Day Smoker -- 0.50 packs/day    Types: Cigarettes  . Smokeless tobacco: Never Used  . Alcohol Use: Yes     Comment: monthly   OB History    No data available     Review of Systems  Constitutional: Negative for fever and chills.  Eyes: Negative for visual disturbance.  Respiratory: Negative for shortness of breath.   Cardiovascular: Negative for chest pain.  Gastrointestinal: Negative for nausea, vomiting, abdominal pain, diarrhea, constipation and bowel incontinence.  Genitourinary: Negative for bladder incontinence, dysuria and hematuria.  Musculoskeletal: Positive for back pain and neck pain. Negative for myalgias and arthralgias.  Skin: Negative for color change.  Allergic/Immunologic: Negative for immunocompromised state.  Neurological: Positive for tingling. Negative for  weakness, numbness and headaches.       +tingling in arms/hands  Psychiatric/Behavioral: Negative for confusion.   10 Systems reviewed and are negative for acute change except as noted in the HPI.    Allergies  Review of patient's allergies indicates no known allergies.  Home  Medications   Prior to Admission medications   Medication Sig Start Date End Date Taking? Authorizing Provider  venlafaxine (EFFEXOR) 75 MG tablet Take 75 mg by mouth daily.   Yes Historical Provider, MD  escitalopram (LEXAPRO) 20 MG tablet Take 20 mg by mouth daily.    Historical Provider, MD  hydrochlorothiazide (HYDRODIURIL) 25 MG tablet Take 25 mg by mouth daily.    Historical Provider, MD  HYDROcodone-acetaminophen (NORCO/VICODIN) 5-325 MG per tablet Take 1-2 tablets by mouth every 6 hours as needed for pain and/or cough. 12/06/14   Nicole Pisciotta, PA-C  ibuprofen (ADVIL,MOTRIN) 800 MG tablet Take 1 tablet (800 mg total) by mouth 3 (three) times daily. 01/12/14   Elson Areas, PA-C  methocarbamol (ROBAXIN) 500 MG tablet Take 1 tablet (500 mg total) by mouth 2 (two) times daily as needed for muscle spasms. 12/06/14   Nicole Pisciotta, PA-C  methocarbamol (ROBAXIN) 500 MG tablet Take 1 tablet (500 mg total) by mouth 2 (two) times daily. 06/01/15   Ace Gins Sam, PA-C  naproxen (NAPROSYN) 500 MG tablet Take 1 tablet (500 mg total) by mouth 2 (two) times daily. 06/01/15   Ace Gins Sam, PA-C  predniSONE (DELTASONE) 20 MG tablet Take 2 tablets (40 mg total) by mouth daily. 06/01/15   Ace Gins Sam, PA-C  simvastatin (ZOCOR) 20 MG tablet Take 20 mg by mouth daily.    Historical Provider, MD   BP 151/84 mmHg  Pulse 82  Temp(Src) 98.1 F (36.7 C) (Oral)  Resp 16  Ht 5\' 1"  (1.549 m)  Wt 68.04 kg  BMI 28.36 kg/m2  SpO2 100%  LMP 05/11/2015 Physical Exam  Constitutional: She is oriented to person, place, and time. Vital signs are normal. She appears well-developed and well-nourished.  Non-toxic appearance. No distress.  Afebrile, nontoxic, NAD  HENT:  Head: Normocephalic and atraumatic.  Mouth/Throat: Mucous membranes are normal.  Eyes: Conjunctivae and EOM are normal. Right eye exhibits no discharge. Left eye exhibits no discharge.  Neck: Normal range of motion. Neck supple. Muscular tenderness  present. No spinous process tenderness present. No rigidity. Normal range of motion present.    FROM intact without spinous process TTP, no bony stepoffs or deformities, with mild b/l trapezius/paraspinous muscle TTP and palpable muscle spasms. No rigidity or meningeal signs. No bruising or swelling.   Cardiovascular: Normal rate and intact distal pulses.   Pulmonary/Chest: Effort normal. No respiratory distress.  Abdominal: Normal appearance. She exhibits no distension.  Musculoskeletal: Normal range of motion.       Thoracic back: She exhibits tenderness and spasm. She exhibits normal range of motion, no bony tenderness and no deformity.       Back:  MAE x4 Strength and sensation grossly intact Distal pulses intact Gait steady C-spine as above Thoracic spine with FROM intact without spinous process TTP, no bony stepoffs or deformities, with mild b/l paraspinous muscle TTP and muscle spasms surrounding the scapulas. No overlying skin changes.   Neurological: She is alert and oriented to person, place, and time. She has normal strength. No sensory deficit.  Skin: Skin is warm, dry and intact. No rash noted.  Psychiatric: She has a normal mood and affect. Her behavior  is normal.  Nursing note and vitals reviewed.   ED Course  Procedures (including critical care time) Labs Review Labs Reviewed - No data to display  Imaging Review No results found. I have personally reviewed and evaluated these images and lab results as part of my medical decision-making.   EKG Interpretation None      MDM   Final diagnoses:  Bilateral thoracic back pain  Back muscle spasm  Paresthesia and pain of both upper extremities    40 y.o. female here with ongoing mid-thoracic back pain x3-4 months. Works Education officer, environmental houses, a lot of repetitive movements. Some tingling/paresthesias. No midline spinal tenderness or concern for spinal cord compression. NVI in all extremities, FROM intact in all  extremities. Gait steady. Discussed that she likely needs outpt MRI as was told to her by PA that saw her 2 days ago. Discussed taking prednisone in the morning to lessen side effects. Continue NSAIDs and robaxin. Doubt need for narcotics as this isn't going to treat the cause, she ultimately needs MRI for further eval. Discussed ice/heat. F/up with PCP in 1wk for ongoing management. I explained the diagnosis and have given explicit precautions to return to the ER including for any other new or worsening symptoms. The patient understands and accepts the medical plan as it's been dictated and I have answered their questions. Discharge instructions concerning home care and prescriptions have been given. The patient is STABLE and is discharged to home in good condition.   BP 151/84 mmHg  Pulse 82  Temp(Src) 98.1 F (36.7 C) (Oral)  Resp 16  Ht  (1.549 m)  Wt 68.04 kg  BMI 28.36 kg/m2  SpO2 100%  LMP 05/11/2015    Alice Grassel Camprubi-Soms, PA-C 06/03/15 1825  Rolland Porter, MD 06/06/15 [phone]

## 2015-06-03 NOTE — ED Notes (Signed)
Pain in neck and between shoulder blades for a few months.  No known original injury.  Was seen at [facility] 2 days ago.  Pt sts the pain is unbearable and she is having to leave work because of it.  Sts the 3 medicines are not working.

## 2016-01-15 DIAGNOSIS — F411 Generalized anxiety disorder: Secondary | ICD-10-CM | POA: Insufficient documentation

## 2016-01-15 DIAGNOSIS — F331 Major depressive disorder, recurrent, moderate: Secondary | ICD-10-CM | POA: Insufficient documentation

## 2016-01-15 DIAGNOSIS — F419 Anxiety disorder, unspecified: Secondary | ICD-10-CM | POA: Insufficient documentation

## 2016-01-15 DIAGNOSIS — I10 Essential (primary) hypertension: Secondary | ICD-10-CM | POA: Insufficient documentation

## 2016-01-15 HISTORY — DX: Major depressive disorder, recurrent, moderate: F33.1

## 2016-02-14 ENCOUNTER — Encounter (HOSPITAL_BASED_OUTPATIENT_CLINIC_OR_DEPARTMENT_OTHER): Payer: Self-pay | Admitting: Emergency Medicine

## 2016-02-14 ENCOUNTER — Emergency Department (HOSPITAL_BASED_OUTPATIENT_CLINIC_OR_DEPARTMENT_OTHER)
Admission: EM | Admit: 2016-02-14 | Discharge: 2016-02-14 | Disposition: A | Discharge status: Home / Self Care | Payer: Medicaid Other | Attending: Dermatology | Admitting: Dermatology

## 2016-02-14 DIAGNOSIS — F17210 Nicotine dependence, cigarettes, uncomplicated: Secondary | ICD-10-CM | POA: Insufficient documentation

## 2016-02-14 DIAGNOSIS — Z79899 Other long term (current) drug therapy: Secondary | ICD-10-CM | POA: Insufficient documentation

## 2016-02-14 DIAGNOSIS — Z5321 Procedure and treatment not carried out due to patient leaving prior to being seen by health care provider: Secondary | ICD-10-CM | POA: Insufficient documentation

## 2016-02-14 DIAGNOSIS — I10 Essential (primary) hypertension: Secondary | ICD-10-CM | POA: Insufficient documentation

## 2016-02-14 DIAGNOSIS — L02413 Cutaneous abscess of right upper limb: Secondary | ICD-10-CM | POA: Diagnosis not present

## 2016-02-14 NOTE — ED Triage Notes (Signed)
Patient states that she has an abcess to her right arm. Patient also with generalized nausea and aches

## 2016-02-14 NOTE — ED Notes (Signed)
Called x2

## 2016-02-14 NOTE — ED Notes (Signed)
Called x3 

## 2016-02-14 NOTE — ED Notes (Signed)
Patient called from waiting room. Not seen in the waiting room and patient did not answer

## 2016-05-26 ENCOUNTER — Ambulatory Visit (INDEPENDENT_AMBULATORY_CARE_PROVIDER_SITE_OTHER): Payer: Medicaid Other | Admitting: Licensed Clinical Social Worker

## 2016-05-26 DIAGNOSIS — F411 Generalized anxiety disorder: Secondary | ICD-10-CM | POA: Diagnosis not present

## 2016-05-26 DIAGNOSIS — F4310 Post-traumatic stress disorder, unspecified: Secondary | ICD-10-CM | POA: Diagnosis not present

## 2016-05-26 DIAGNOSIS — F341 Dysthymic disorder: Secondary | ICD-10-CM | POA: Diagnosis not present

## 2016-05-27 ENCOUNTER — Encounter (HOSPITAL_COMMUNITY): Payer: Self-pay | Admitting: Licensed Clinical Social Worker

## 2016-05-27 DIAGNOSIS — F4310 Post-traumatic stress disorder, unspecified: Secondary | ICD-10-CM | POA: Insufficient documentation

## 2016-05-27 DIAGNOSIS — F341 Dysthymic disorder: Secondary | ICD-10-CM | POA: Insufficient documentation

## 2016-05-27 DIAGNOSIS — F4312 Post-traumatic stress disorder, chronic: Secondary | ICD-10-CM | POA: Insufficient documentation
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
## 2016-06-09 ENCOUNTER — Ambulatory Visit (HOSPITAL_COMMUNITY): Payer: Self-pay | Admitting: Licensed Clinical Social Worker

## 2016-06-13 ENCOUNTER — Encounter (HOSPITAL_BASED_OUTPATIENT_CLINIC_OR_DEPARTMENT_OTHER): Payer: Self-pay | Admitting: *Deleted

## 2016-06-13 ENCOUNTER — Emergency Department (HOSPITAL_BASED_OUTPATIENT_CLINIC_OR_DEPARTMENT_OTHER)
Admission: EM | Admit: 2016-06-13 | Discharge: 2016-06-13 | Disposition: A | Discharge status: Home / Self Care | Payer: Medicaid Other | Attending: Emergency Medicine | Admitting: Emergency Medicine

## 2016-06-13 ENCOUNTER — Emergency Department (HOSPITAL_BASED_OUTPATIENT_CLINIC_OR_DEPARTMENT_OTHER): Payer: Medicaid Other

## 2016-06-13 DIAGNOSIS — I10 Essential (primary) hypertension: Secondary | ICD-10-CM | POA: Insufficient documentation

## 2016-06-13 DIAGNOSIS — F17210 Nicotine dependence, cigarettes, uncomplicated: Secondary | ICD-10-CM | POA: Insufficient documentation

## 2016-06-13 DIAGNOSIS — F909 Attention-deficit hyperactivity disorder, unspecified type: Secondary | ICD-10-CM | POA: Insufficient documentation

## 2016-06-13 DIAGNOSIS — Z79899 Other long term (current) drug therapy: Secondary | ICD-10-CM | POA: Insufficient documentation

## 2016-06-13 DIAGNOSIS — R69 Illness, unspecified: Secondary | ICD-10-CM

## 2016-06-13 DIAGNOSIS — J111 Influenza due to unidentified influenza virus with other respiratory manifestations: Secondary | ICD-10-CM

## 2016-06-13 DIAGNOSIS — R0789 Other chest pain: Secondary | ICD-10-CM | POA: Insufficient documentation

## 2016-06-13 LAB — BASIC METABOLIC PANEL
ANION GAP: 5 (ref 5–15)
BUN: 10 mg/dL (ref 6–20)
CO2: 28 mmol/L (ref 22–32)
Calcium: 8.6 mg/dL — ABNORMAL LOW (ref 8.9–10.3)
Chloride: 104 mmol/L (ref 101–111)
Creatinine, Ser: 0.68 mg/dL (ref 0.44–1.00)
GLUCOSE: 112 mg/dL — AB (ref 65–99)
POTASSIUM: 4.2 mmol/L (ref 3.5–5.1)
Sodium: 137 mmol/L (ref 135–145)

## 2016-06-13 LAB — CBC WITH DIFFERENTIAL/PLATELET
Basophils Absolute: 0 10*3/uL (ref 0.0–0.1)
Basophils Relative: 0 %
Eosinophils Absolute: 0.1 10*3/uL (ref 0.0–0.7)
Eosinophils Relative: 1 %
HCT: 42.9 % (ref 36.0–46.0)
Hemoglobin: 14.6 g/dL (ref 12.0–15.0)
Lymphocytes Relative: 27 %
Lymphs Abs: 2.2 10*3/uL (ref 0.7–4.0)
MCH: 30.4 pg (ref 26.0–34.0)
MCHC: 34 g/dL (ref 30.0–36.0)
MCV: 89.4 fL (ref 78.0–100.0)
Monocytes Absolute: 0.4 10*3/uL (ref 0.1–1.0)
Monocytes Relative: 5 %
Neutro Abs: 5.6 10*3/uL (ref 1.7–7.7)
Neutrophils Relative %: 67 %
Platelets: 393 10*3/uL (ref 150–400)
RBC: 4.8 MIL/uL (ref 3.87–5.11)
RDW: 12.4 % (ref 11.5–15.5)
WBC: 8.4 10*3/uL (ref 4.0–10.5)

## 2016-06-13 LAB — D-DIMER, QUANTITATIVE (NOT AT ARMC)

## 2016-06-13 IMAGING — CR DG CHEST 2V
2 series · 2 of 2 positions shown · non-contrast
Comparison: [DATE]

CLINICAL DATA: Cough and congestion.  Chest pressure

EXAM:
CHEST  2 VIEW

[w chest pa]
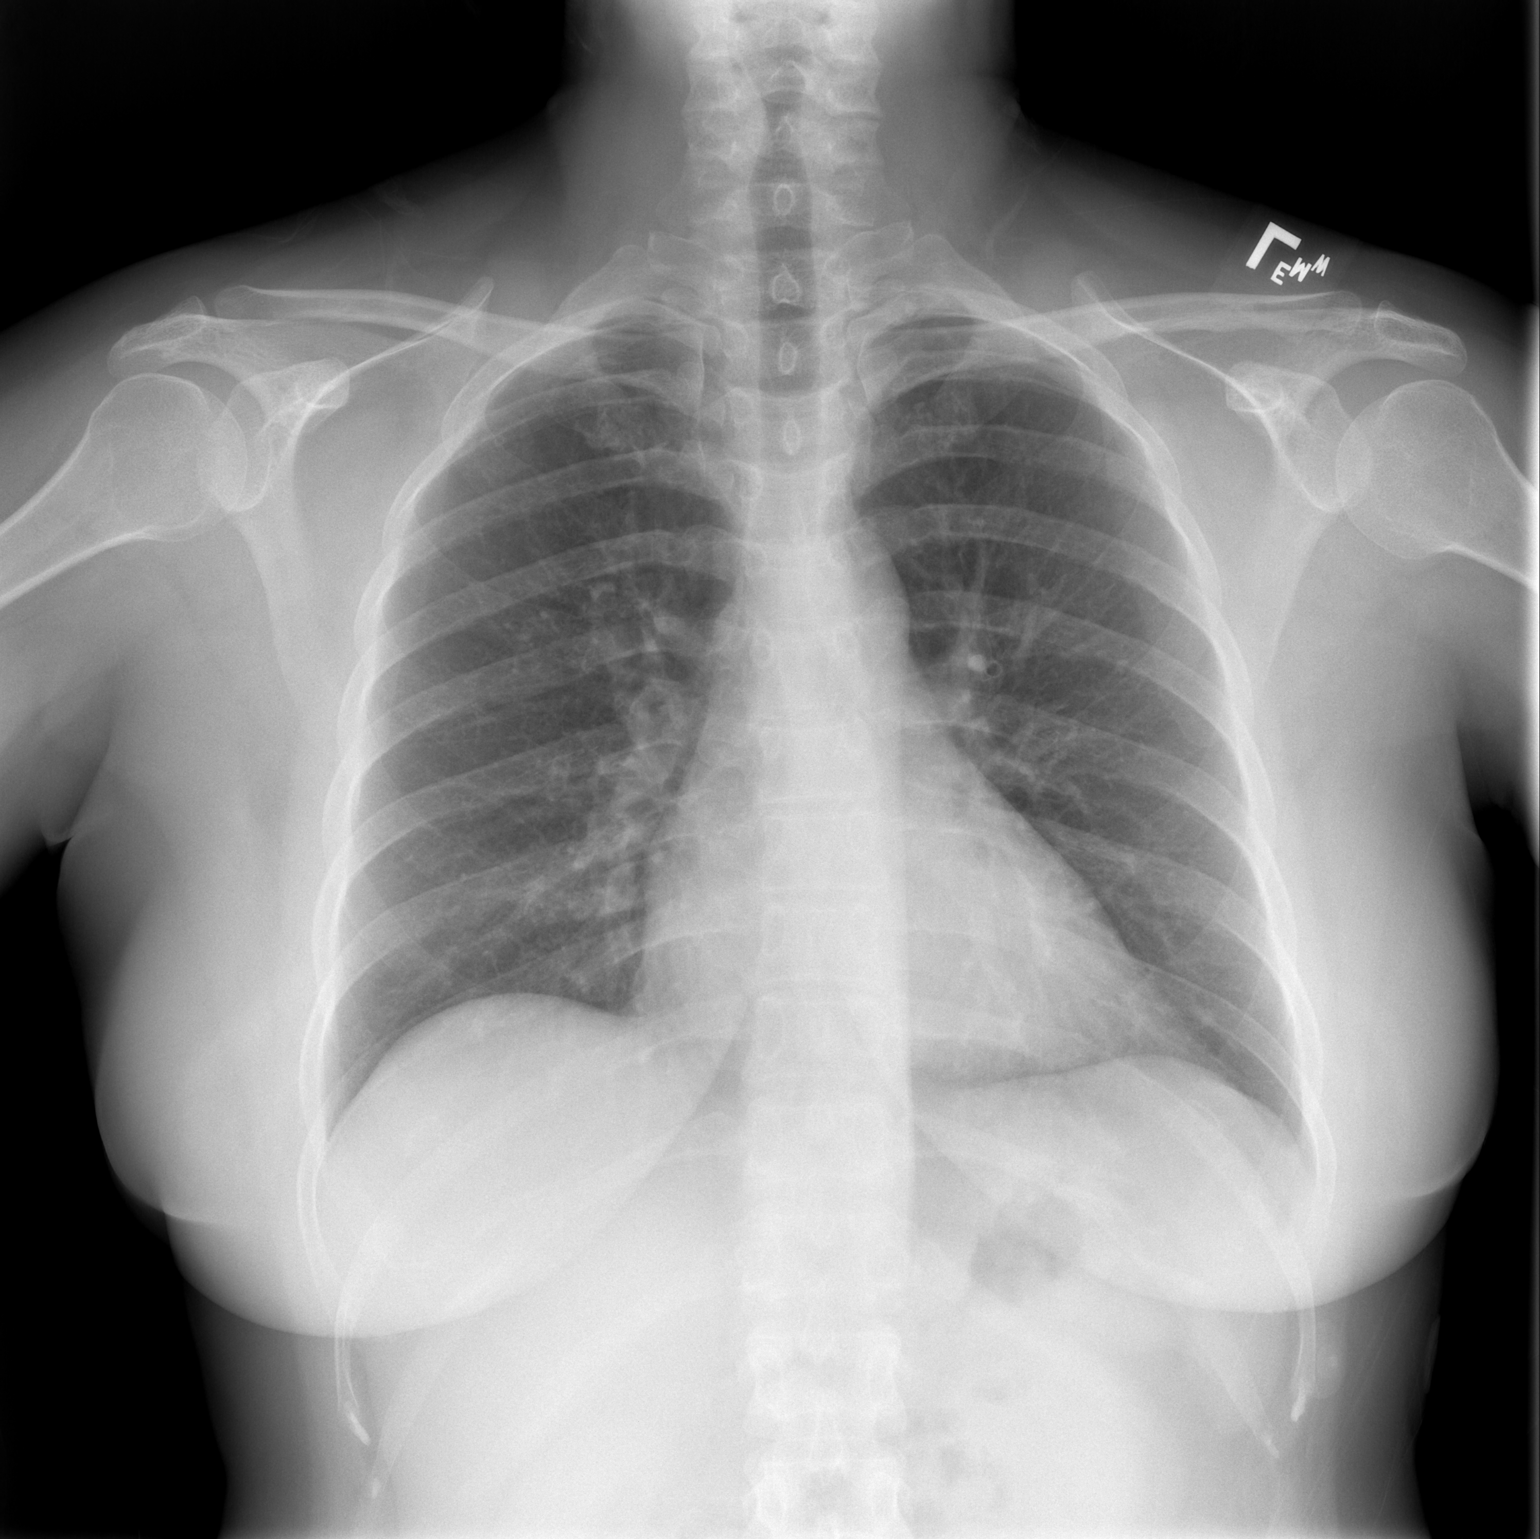

[w chest lat]
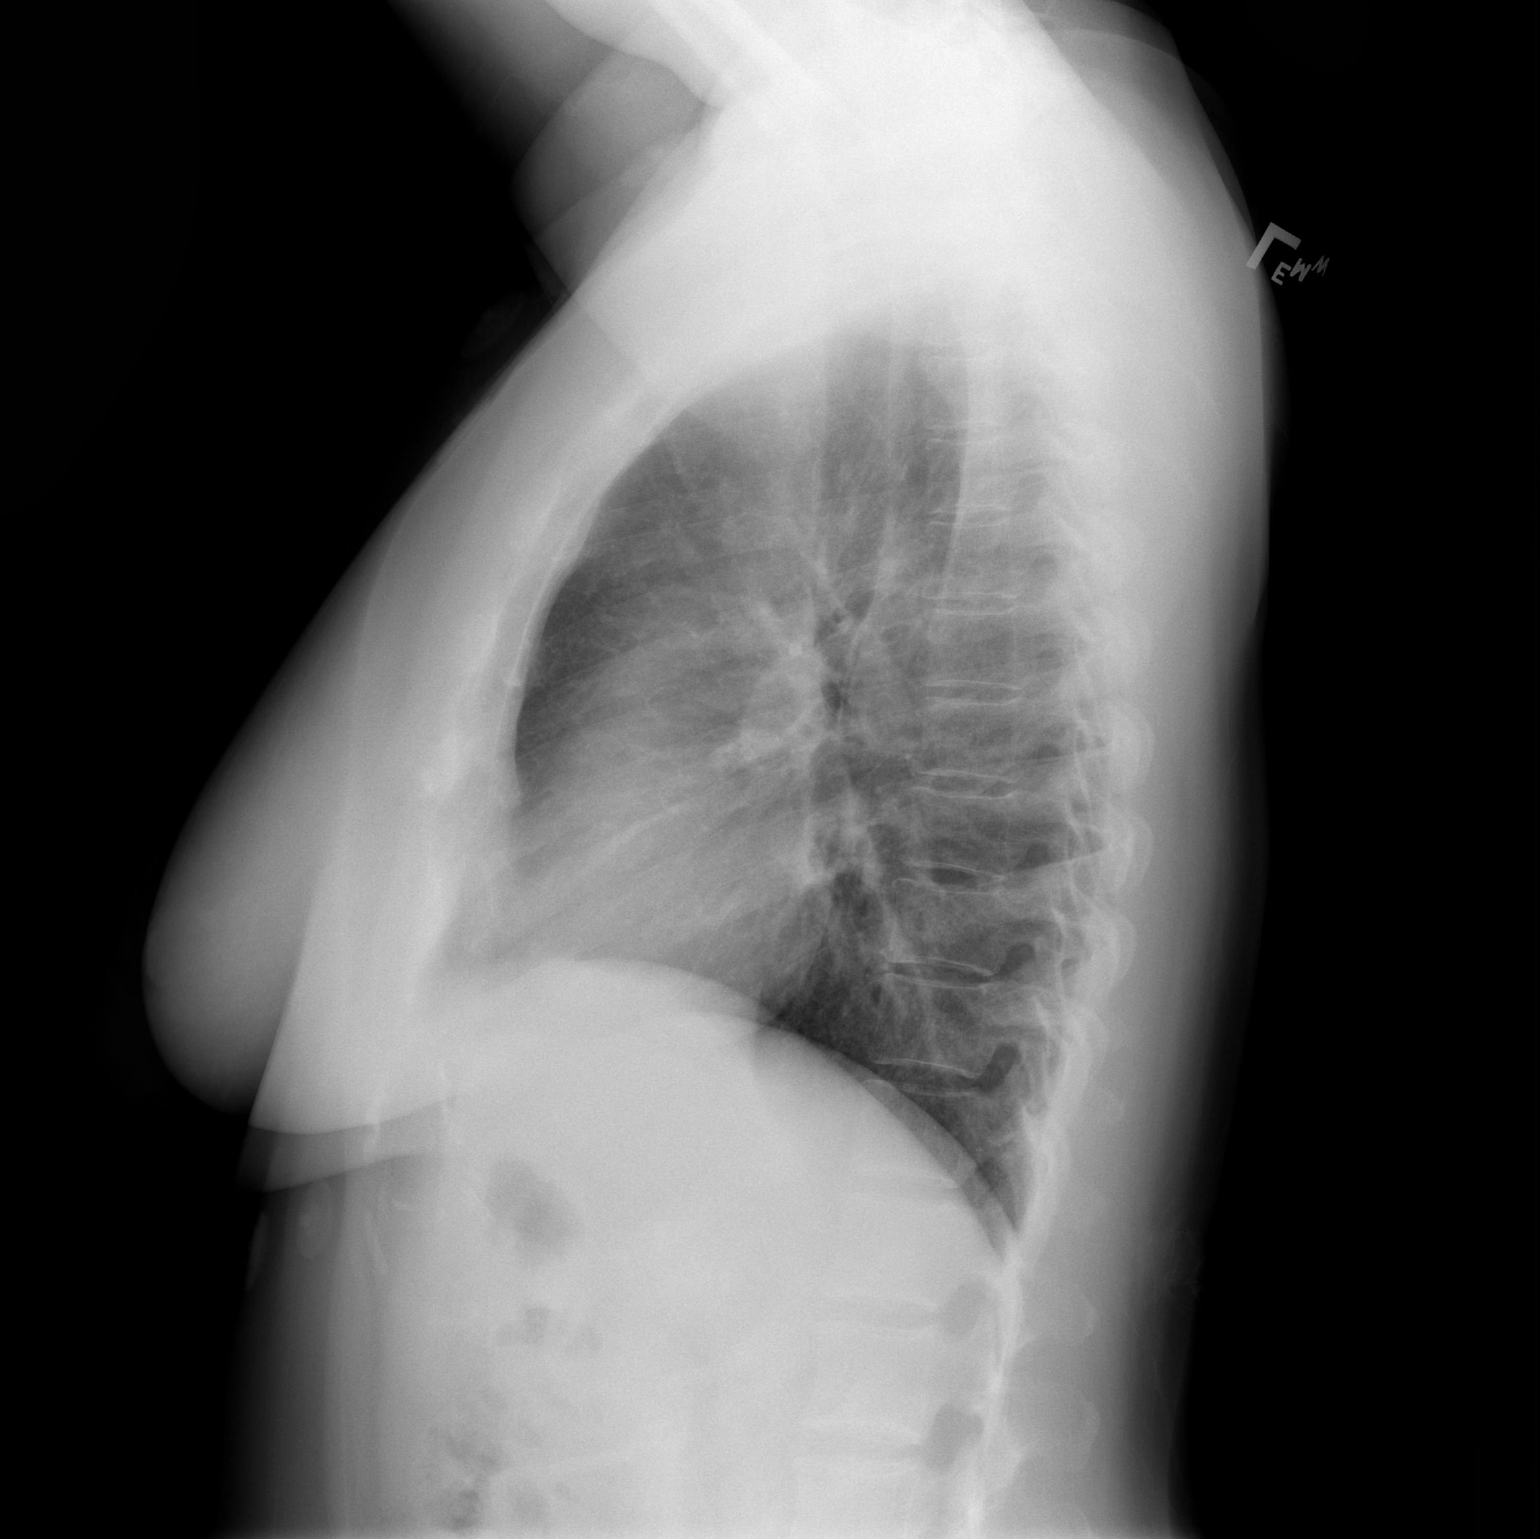

[2 of 2 positions shown; findings below may reference images not displayed]

FINDINGS: Lungs are clear. The heart size and pulmonary vascularity are
normal. No adenopathy. No pneumothorax. No bone lesions.
IMPRESSION: No edema or consolidation.

## 2016-06-13 MED ORDER — PROMETHAZINE-DM 6.25-15 MG/5ML PO SYRP: 5.0000 mL | ORAL_SOLUTION | Freq: Four times a day (QID) | ORAL | 0 refills | Status: DC | PRN

## 2016-06-13 MED ORDER — IBUPROFEN 800 MG PO TABS: 800.0000 mg | ORAL_TABLET | Freq: Three times a day (TID) | ORAL | 0 refills | Status: DC | PRN

## 2016-06-13 MED ORDER — KETOROLAC TROMETHAMINE 30 MG/ML IJ SOLN
30.0000 mg | Freq: Once | INTRAMUSCULAR | Status: AC
  Administered 2016-06-13: 30 mg via INTRAVENOUS
  Filled 2016-06-13: qty 1

## 2016-06-13 MED ORDER — SODIUM CHLORIDE 0.9 % IV BOLUS (SEPSIS)
1000.0000 mL | Freq: Once | INTRAVENOUS | Status: AC
  Administered 2016-06-13: 1000 mL via INTRAVENOUS

## 2016-06-13 MED ORDER — GUAIFENESIN ER 1200 MG PO TB12: 1.0000 | ORAL_TABLET | Freq: Two times a day (BID) | ORAL | 0 refills | Status: DC

## 2016-06-13 NOTE — ED Notes (Signed)
Patient c/o generalized body aches, headache, chills.  Reports tested for flu twice last week with negative result.

## 2016-06-13 NOTE — ED Triage Notes (Signed)
Pt reports sx x 1 week. Has been evaluated x2 and was told she had a sinus infection. C/o pain in ribs and central chest "feels like a band"

## 2016-06-13 NOTE — Discharge Instructions (Signed)
Return here as needed.  Follow-up with your primary doctor, increase her fluid intake, rest as much possible °

## 2016-06-14 NOTE — ED Provider Notes (Signed)
[facility] Provider Note   CSN: [id number] Arrival date & time: 06/13/16  1011     History   Chief Complaint Chief Complaint  Patient presents with  . Chest Pain    HPI Alice Martinez is a 41 y.o. female.  HPI  Patient presents to the emergency department with body aches, headache, chills along with cough.  The patient states that she has been seen by her primary care doctor twice in the last week.  She was tested for flu which came back negative.  She is treated for upper respiratory symptoms and a sinus infection.  Patient states nothing seems make the condition better, states movement and palpation makes the pain worse.  She states she did not take any other medicines other than the prescribed medicines prior to arrival. The patient denies  shortness of breath, headache,blurred vision, neck pain, fever, weakness, numbness, dizziness, anorexia, edema, abdominal pain, nausea, vomiting, diarrhea, rash, back pain, dysuria, hematemesis, bloody stool, near syncope, or syncope. Past Medical History:  Diagnosis Date  . ADHD (attention deficit hyperactivity disorder)   . Depression   . High cholesterol   . Hypertension     Patient Active Problem List   Diagnosis Date Noted  . PTSD (post-traumatic stress disorder) 05/27/2016  . Dysthymia 05/27/2016  . GAD (generalized anxiety disorder) 01/15/2016  . Major depressive disorder, recurrent episode, moderate (HCC) 01/15/2016  . Essential hypertension 01/15/2016  . 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 Mixed hyperlipidemia 12/09/2010    Past Surgical History:  Procedure Laterality Date  . TONSILLECTOMY    . TUBAL LIGATION      OB History    No data available       Home Medications    Prior to Admission medications   Medication Sig Start Date End Date Taking? Authorizing Provider  amphetamine-dextroamphetamine (ADDERALL) 15 MG tablet Take by mouth 2 (two) times daily.  07/14/16  Yes Historical Provider, MD   FLUoxetine (PROZAC) 20 MG capsule Take by mouth. 05/16/16  Yes Historical Provider, MD  hydrochlorothiazide (HYDRODIURIL) 25 MG tablet Take 25 mg by mouth daily.   Yes Historical Provider, MD  lisinopril (PRINIVIL,ZESTRIL) 10 MG tablet Take 10 mg by mouth daily.   Yes Historical Provider, MD  zolpidem (AMBIEN) 10 MG tablet Take by mouth. 05/16/16  Yes Historical Provider, MD  amphetamine-dextroamphetamine (ADDERALL) 10 MG tablet Take 10 mg by mouth daily with breakfast.    Historical Provider, MD  buPROPion (WELLBUTRIN SR) 150 MG 12 hr tablet 150 mg once daily for 3 days, then 150 mg twice daily; treatment should continue for 7 to 12 weeks Therapy should begin at least 1 week before target quit date. 05/16/16   Historical Provider, MD  escitalopram (LEXAPRO) 20 MG tablet Take 20 mg by mouth daily.    Historical Provider, MD  Guaifenesin 1200 MG TB12 Take 1 tablet (1,200 mg total) by mouth 2 (two) times daily. 06/13/16   Charlestine Nighthristopher Jalei Shibley, PA-C  HYDROcodone-acetaminophen (NORCO/VICODIN) 5-325 MG per tablet Take 1-2 tablets by mouth every 6 hours as needed for pain and/or cough. 12/06/14   Nicole Pisciotta, PA-C  ibuprofen (ADVIL,MOTRIN) 800 MG tablet Take 1 tablet (800 mg total) by mouth every 8 (eight) hours as needed. 06/13/16   Charlestine Nighthristopher Leston Schueller, PA-C  methocarbamol (ROBAXIN) 500 MG tablet Take 1 tablet (500 mg total) by mouth 2 (two) times daily as needed for muscle spasms. 12/06/14   Nicole Pisciotta, PA-C  methocarbamol (ROBAXIN) 500 MG tablet Take 1 tablet (  500 mg total) by mouth 2 (two) times daily. 06/01/15   Ace Gins Sam, PA-C  naproxen (NAPROSYN) 500 MG tablet Take 1 tablet (500 mg total) by mouth 2 (two) times daily. 06/01/15   Ace Gins Sam, PA-C  predniSONE (DELTASONE) 20 MG tablet Take 2 tablets (40 mg total) by mouth daily. 06/01/15   Ace Gins Sam, PA-C  promethazine-dextromethorphan (PROMETHAZINE-DM) 6.25-15 MG/5ML syrup Take 5 mLs by mouth 4 (four) times daily as needed for cough. 06/13/16    Charlestine Night, PA-C  simvastatin (ZOCOR) 20 MG tablet Take 20 mg by mouth daily.    Historical Provider, MD  venlafaxine (EFFEXOR) 75 MG tablet Take 75 mg by mouth daily.    Historical Provider, MD    Family History No family history on file.  Social History Social History  Substance Use Topics  . Smoking status: Current Every Day Smoker    Packs/day: 0.50    Types: Cigarettes  . Smokeless tobacco: Never Used  . Alcohol use Yes     Comment: monthly     Allergies   Patient has no known allergies.   Review of Systems Review of Systems All other systems negative except as documented in the HPI. All pertinent positives and negatives as reviewed in the HPI. Physical Exam Updated Vital Signs BP 127/74 (BP Location: Right Arm)   Pulse 89   Temp 98.1 F (36.7 C) (Oral)   Resp 16   Ht 5\' 1"  (1.549 m)   Wt 72.6 kg   LMP 06/04/2016   SpO2 99%   BMI 30.23 kg/m   Physical Exam  Constitutional: She is oriented to person, place, and time. She appears well-developed and well-nourished. No distress.  HENT:  Head: Normocephalic and atraumatic.  Mouth/Throat: Oropharynx is clear and moist.  Eyes: Pupils are equal, round, and reactive to light.  Neck: Normal range of motion. Neck supple.  Cardiovascular: Normal rate, regular rhythm and normal heart sounds.  Exam reveals no gallop and no friction rub.   No murmur heard. Pulmonary/Chest: Effort normal and breath sounds normal. No respiratory distress. She has no wheezes. She exhibits tenderness.  Abdominal: Soft. Bowel sounds are normal. She exhibits no distension. There is no tenderness.  Neurological: She is alert and oriented to person, place, and time. She exhibits normal muscle tone. Coordination normal.  Skin: Skin is warm and dry. Capillary refill takes less than 2 seconds. No rash noted. No erythema.  Psychiatric: She has a normal mood and affect. Her behavior is normal.  Nursing note and vitals reviewed.    ED  Treatments / Results  Labs (all labs ordered are listed, but only abnormal results are displayed) Labs Reviewed  BASIC METABOLIC PANEL - Abnormal; Notable for the following:       Result Value   Glucose, Bld 112 (*)    Calcium 8.6 (*)    All other components within normal limits  CBC WITH DIFFERENTIAL/PLATELET  D-DIMER, QUANTITATIVE (NOT AT [facility])    EKG  EKG Interpretation None       Radiology Dg Chest 2 View  Result Date: 06/13/2016 CLINICAL DATA:  Cough and congestion.  Chest pressure EXAM: CHEST  2 VIEW COMPARISON:  June 14, 2010 FINDINGS: Lungs are clear. The heart size and pulmonary vascularity are normal. No adenopathy. No pneumothorax. No bone lesions. IMPRESSION: No edema or consolidation. Electronically Signed   By: Bretta Bang III M.D.   On: 06/13/2016 11:25    Procedures Procedures (including critical care time)  Medications Ordered in ED Medications  sodium chloride 0.9 % bolus 1,000 mL (0 mLs Intravenous Stopped 06/13/16 1305)  ketorolac (TORADOL) 30 MG/ML injection 30 mg (30 mg Intravenous Given 06/13/16 1406)     Initial Impression / Assessment and Plan / ED Course  I have reviewed the triage vital signs and the nursing notes.  Pertinent labs & imaging results that were available during my care of the patient were reviewed by me and considered in my medical decision making (see chart for details).    Patient will be treated for influenza-like illness and chest discomfort.  I advised patient to return here as needed.  Told to follow up with her primary care doctor   Final Clinical Impressions(s) / ED Diagnoses   Final diagnoses:  Influenza-like illness    New Prescriptions Discharge Medication List as of 06/13/2016  2:08 PM    START taking these medications   Details  Guaifenesin 1200 MG TB12 Take 1 tablet (1,200 mg total) by mouth 2 (two) times daily., Starting Mon 06/13/2016, Print    promethazine-dextromethorphan (PROMETHAZINE-DM)  6.25-15 MG/5ML syrup Take 5 mLs by mouth 4 (four) times daily as needed for cough., Starting Mon 06/13/2016, Print         Charlestine Night, PA-C 06/17/16 0013    Melene Plan, DO 06/17/16 [phone]

## 2016-06-16 ENCOUNTER — Ambulatory Visit (INDEPENDENT_AMBULATORY_CARE_PROVIDER_SITE_OTHER): Payer: Medicaid Other | Admitting: Psychiatry

## 2016-06-16 ENCOUNTER — Encounter (HOSPITAL_COMMUNITY): Payer: Self-pay | Admitting: Psychiatry

## 2016-06-16 VITALS — BP 122/80 | HR 113 | Resp 16 | Ht 61.0 in | Wt 160.0 lb

## 2016-06-16 DIAGNOSIS — F17210 Nicotine dependence, cigarettes, uncomplicated: Secondary | ICD-10-CM

## 2016-06-16 DIAGNOSIS — F341 Dysthymic disorder: Secondary | ICD-10-CM

## 2016-06-16 DIAGNOSIS — Z9851 Tubal ligation status: Secondary | ICD-10-CM

## 2016-06-16 DIAGNOSIS — F4310 Post-traumatic stress disorder, unspecified: Secondary | ICD-10-CM

## 2016-06-16 DIAGNOSIS — F411 Generalized anxiety disorder: Secondary | ICD-10-CM | POA: Diagnosis not present

## 2016-06-16 DIAGNOSIS — Z9889 Other specified postprocedural states: Secondary | ICD-10-CM

## 2016-06-16 DIAGNOSIS — Z818 Family history of other mental and behavioral disorders: Secondary | ICD-10-CM

## 2016-06-16 DIAGNOSIS — Z79899 Other long term (current) drug therapy: Secondary | ICD-10-CM

## 2016-06-16 MED ORDER — BUSPIRONE HCL 7.5 MG PO TABS: 7.5000 mg | ORAL_TABLET | Freq: Every day | ORAL | 0 refills | Status: DC | PRN

## 2016-06-16 NOTE — Patient Instructions (Signed)
Cut down adderall to half dose of 7.5mg  qd for 5 days and then stop.  Monitor pulse and if palpitations report to ED  Cut down cigarettes and consider to stop
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
## 2016-06-27 ENCOUNTER — Ambulatory Visit (INDEPENDENT_AMBULATORY_CARE_PROVIDER_SITE_OTHER): Payer: Medicaid Other | Admitting: Licensed Clinical Social Worker

## 2016-06-27 DIAGNOSIS — F341 Dysthymic disorder: Secondary | ICD-10-CM | POA: Diagnosis not present

## 2016-06-27 DIAGNOSIS — F4310 Post-traumatic stress disorder, unspecified: Secondary | ICD-10-CM | POA: Diagnosis not present

## 2016-06-27 DIAGNOSIS — F411 Generalized anxiety disorder: Secondary | ICD-10-CM | POA: Diagnosis not present

## 2016-06-27 NOTE — Progress Notes (Signed)
   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  Briefly described interventions she can expect as she participates in therapy. Had patient complete a PCL-5 to assess for severity of PTSD-related symptoms. Explored thoughts and feelings patient has had regarding a recent conflict with her oldest daughter.  Provided positive feedback regarding her decision to clarify expectations about her behavior when it comes to being respectful.      Summary:  Developed the following treatment goals: Alice Martinez will report a significant improvement with her ability to relax, improvement with ability to focus on tasks, and a significant decrease in the amount of time she is worrying throughout the day.     Alice Martinez will report feeling satisfied with how she is coping with interactions with family.  She will be able to set boundaries and stick to them.  Score on the PCL-5 was 36.  She rated the following items as bothering her quite a bit or extremely within the past month: Distressing memories of a stressful event Avoiding memories, thoughts, or feelings about an event Strong negative beliefs about self, others, or the world Strong negative feelings of fear, horror, anger, guilt, or shame Feeling distant or cut off from others      Plan: Will schedule next appointment in approximately 2 weeks.  May focus on psycho-ed about PTSD.  Diagnosis: PTSD                         Generalized Anxiety  Disorder                          Persistent Depressive Disorder    Darrin LuisSolomon, Sarah A, LCSW 06/27/2016

## 2016-07-06 ENCOUNTER — Ambulatory Visit (HOSPITAL_COMMUNITY): Payer: Self-pay | Admitting: Psychiatry

## 2016-07-13 ENCOUNTER — Ambulatory Visit (INDEPENDENT_AMBULATORY_CARE_PROVIDER_SITE_OTHER): Payer: Medicaid Other | Admitting: Licensed Clinical Social Worker

## 2016-07-13 DIAGNOSIS — F4310 Post-traumatic stress disorder, unspecified: Secondary | ICD-10-CM

## 2016-07-13 DIAGNOSIS — F411 Generalized anxiety disorder: Secondary | ICD-10-CM | POA: Diagnosis not present

## 2016-07-13 NOTE — Progress Notes (Signed)
   THERAPIST PROGRESS NOTE  Session Time: 9:15am-10:00am  Participation Level: Active  Behavioral Response: Casual Alert  Anxious  Type of Therapy: Individual Therapy  Treatment Goals addressed: Developed treatment plan today- reduce anxiety and worry, improve communication with family  Interventions: Interpersonal effectiveness  Suicidal/Homicidal: Denied both  Therapist Interventions:  Gathered information about progress patient has made with her treatment goals.  Discussed worries related to learning that her sister is getting married to a guy with a criminal record, history of problems with drugs, and whom she hasn't even introduced to the family.  Validated her concerns regarding safety.  Encouraged her to speak up and express some of her concerns to her sister.  Acknowledged that her sister may not be open to considering what she has to say, but at least she can feel good about expressing how she feels.     Summary:   Reported feeling more relaxed lately.  Talked about how she accomplished a lot over the weekend.  Feeling better about her relationship with her daughter.    Indicated that she will likely try to communicate her concerns to her sister, despite assumptions that her concerns will not be taken seriously.  Would like to do so at a time when it would be just the two of them.      Plan: Will schedule next appointment in approximately 2 weeks.        Diagnosis: PTSD                         Generalized Anxiety Disorder                          Persistent Depressive Disorder    Darrin LuisSolomon, Sarah A, LCSW 07/13/2016

## 2016-07-18 ENCOUNTER — Ambulatory Visit (HOSPITAL_COMMUNITY): Payer: Self-pay | Admitting: Psychiatry

## 2016-07-27 ENCOUNTER — Ambulatory Visit (HOSPITAL_COMMUNITY): Payer: Self-pay | Admitting: Licensed Clinical Social Worker

## 2016-08-10 ENCOUNTER — Ambulatory Visit (INDEPENDENT_AMBULATORY_CARE_PROVIDER_SITE_OTHER): Payer: Medicaid Other | Admitting: Licensed Clinical Social Worker

## 2016-08-10 DIAGNOSIS — F4310 Post-traumatic stress disorder, unspecified: Secondary | ICD-10-CM | POA: Diagnosis not present

## 2016-08-10 DIAGNOSIS — F411 Generalized anxiety disorder: Secondary | ICD-10-CM | POA: Diagnosis not present

## 2016-08-10 DIAGNOSIS — F341 Dysthymic disorder: Secondary | ICD-10-CM | POA: Diagnosis not present

## 2016-08-10 NOTE — Progress Notes (Signed)
   THERAPIST PROGRESS NOTE  Session Time: 2:00pm-2:55pm  Participation Level: Active  Behavioral Response: Casual Alert  Euthymic  Type of Therapy: Individual Therapy  Treatment Goals addressed: Reduce anxiety and worry, improve communication with family  Interventions: Psycho-ed about anxiety, relaxation training  Suicidal/Homicidal: Denied both  Therapist Interventions:  Gathered information about progress patient has made with her treatment goals.  Educated patient about fight or flight and how the response is activated anytime you think there is a potential threat.  Explained this happens whether the threat is real or not.  Reviewed bodily changes that occur in fight or flight.  Emphasized that panic symptoms are not dangerous. Taught patient a breathing exercise called The 4-7-8 Breath.  Had her watch a video of someone demonstrating the technique.  Had her practice the technique.   Provided her with some reading material about mindfulness to read for homework.     Summary:  Reported feeling "good" since last seen a few weeks ago.  Focus has improved.  She has been worrying less often.  Talked about how she has decided it is better not to get herself involved in family drama unless it affects her.  This has helped a lot. Wasn't familiar with fight or flight.  Indicated she understood the concepts explained to her.  Over time she has learned that panic symptoms are not dangerous.   Cooperative about practicing the breathing exercise.  Commented on how she thought it could be helpful to practice.  Indicated she intends to practice it twice a day.        Plan:  Return in 2-3 weeks.  Will focus on teaching mindfulness skills       Diagnosis: PTSD                         Generalized Anxiety Disorder                          Persistent Depressive Disorder    Alice Martinez 08/10/2016

## 2016-08-26 ENCOUNTER — Ambulatory Visit (HOSPITAL_COMMUNITY): Payer: Self-pay | Admitting: Licensed Clinical Social Worker

## 2016-09-22 ENCOUNTER — Encounter (HOSPITAL_BASED_OUTPATIENT_CLINIC_OR_DEPARTMENT_OTHER): Payer: Self-pay

## 2016-09-22 ENCOUNTER — Emergency Department (HOSPITAL_BASED_OUTPATIENT_CLINIC_OR_DEPARTMENT_OTHER)
Admission: EM | Admit: 2016-09-22 | Discharge: 2016-09-22 | Disposition: A | Discharge status: Home / Self Care | Payer: Medicaid Other | Attending: Emergency Medicine | Admitting: Emergency Medicine

## 2016-09-22 DIAGNOSIS — Y929 Unspecified place or not applicable: Secondary | ICD-10-CM | POA: Diagnosis not present

## 2016-09-22 DIAGNOSIS — I10 Essential (primary) hypertension: Secondary | ICD-10-CM | POA: Diagnosis not present

## 2016-09-22 DIAGNOSIS — S61215A Laceration without foreign body of left ring finger without damage to nail, initial encounter: Secondary | ICD-10-CM | POA: Diagnosis present

## 2016-09-22 DIAGNOSIS — F909 Attention-deficit hyperactivity disorder, unspecified type: Secondary | ICD-10-CM | POA: Insufficient documentation

## 2016-09-22 DIAGNOSIS — Y990 Civilian activity done for income or pay: Secondary | ICD-10-CM | POA: Insufficient documentation

## 2016-09-22 DIAGNOSIS — W268XXA Contact with other sharp object(s), not elsewhere classified, initial encounter: Secondary | ICD-10-CM | POA: Insufficient documentation

## 2016-09-22 DIAGNOSIS — F17210 Nicotine dependence, cigarettes, uncomplicated: Secondary | ICD-10-CM | POA: Diagnosis not present

## 2016-09-22 DIAGNOSIS — Y939 Activity, unspecified: Secondary | ICD-10-CM | POA: Insufficient documentation

## 2016-09-22 MED ORDER — TETANUS-DIPHTH-ACELL PERTUSSIS 5-2.5-18.5 LF-MCG/0.5 IM SUSP
0.5000 mL | Freq: Once | INTRAMUSCULAR | Status: AC
  Administered 2016-09-22: 0.5 mL via INTRAMUSCULAR
  Filled 2016-09-22: qty 0.5

## 2016-09-22 MED ORDER — LIDOCAINE HCL (PF) 1 % IJ SOLN
5.0000 mL | Freq: Once | INTRAMUSCULAR | Status: AC
  Administered 2016-09-22: 5 mL
  Filled 2016-09-22: qty 5

## 2016-09-22 MED ORDER — SODIUM BICARBONATE 4 % IV SOLN
5.0000 mL | Freq: Once | INTRAVENOUS | Status: AC
  Administered 2016-09-22: 5 mL via SUBCUTANEOUS
  Filled 2016-09-22: qty 5

## 2016-09-22 NOTE — ED Notes (Signed)
ED Provider at bedside. 

## 2016-09-22 NOTE — ED Provider Notes (Signed)
[facility]   CSN: [id number] Arrival date & time: 09/22/16  1958  By signing my name below, I, Alice Martinez, attest that this documentation has been prepared under the direction and in the presence of Alice Martinez, Alice Trawick, MD. Electronically Signed: Diona BrownerJennifer Martinez, ED Scribe. 09/22/16. 8:16 PM.  History   Chief Complaint Chief Complaint  Patient presents with  . Finger Injury    HPI Alice Martinez is a 41 y.o. female who presents to the Emergency Department complaining of a sudden laceration to her left ring finger the occurred ~ 20 minutes PTA. Pt cut herself on a metal can at work. Bleeding controlled in triage. Notes numbness to her finger. Not sure if tetanus is UTD. Pt denies nausea, and vomiting.   The history is provided by the patient. No language interpreter was used.    Past Medical History:  Diagnosis Date  . ADHD (attention deficit hyperactivity disorder)   . Depression   . High cholesterol   . Hypertension     Patient Active Problem List   Diagnosis Date Noted  . PTSD (post-traumatic stress disorder) 05/27/2016  . Dysthymia 05/27/2016  . GAD (generalized anxiety disorder) 01/15/2016  . Major depressive disorder, recurrent episode, moderate (HCC) 01/15/2016  . Essential hypertension 01/15/2016  . 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 Mixed hyperlipidemia 12/09/2010    Past Surgical History:  Procedure Laterality Date  . TONSILLECTOMY    . TUBAL LIGATION      OB History    No data available       Home Medications    Prior to Admission medications   Medication Sig Start Date End Date Taking? Authorizing Provider  amphetamine-dextroamphetamine (ADDERALL) 15 MG tablet Take by mouth 2 (two) times daily.  07/14/16   [provider]  FLUoxetine (PROZAC) 20 MG capsule Take by mouth. 05/16/16   [provider]  hydrochlorothiazide (HYDRODIURIL) 25 MG tablet Take 25 mg by mouth daily.    [provider]  zolpidem (AMBIEN) 10 MG tablet Take by mouth. 05/16/16   [provider]    Family History No family history on file.  Social History Social History  Substance Use Topics  . Smoking status: Current Every Day Smoker    Packs/day: 0.50    Types: Cigarettes  . Smokeless tobacco: Never Used  . Alcohol use Yes     Comment: occ     Allergies   Patient has no known allergies.   Review of Systems Review of Systems  Gastrointestinal: Negative for nausea and vomiting.  Skin: Positive for wound.     Physical Exam Updated Vital Signs BP (!) 142/94 (BP Location: Right Arm)   Pulse 100   Temp 98.1 F (36.7 C) (Oral)   Resp 18   Wt 70.9 kg (156 lb 4.8 oz)   LMP 09/19/2016   SpO2 100%   BMI 29.53 kg/m   Physical Exam  Constitutional: She is oriented to person, place, and time. She appears well-developed and well-nourished.  HENT:  Head: Normocephalic and atraumatic.  Right Ear: External ear normal.  Left Ear: External ear normal.  Mouth/Throat: Oropharynx is clear and moist.  Eyes: Conjunctivae and EOM are normal. Pupils are equal, round, and reactive to light. No scleral icterus.  Neck: Normal range of motion.  Cardiovascular: Normal rate, regular rhythm, normal heart sounds and intact distal pulses.  Exam reveals no gallop and no friction rub.   No murmur heard. Pulmonary/Chest: Effort normal and breath  sounds normal. No respiratory distress. She has no wheezes.  Abdominal: Soft. Bowel sounds are normal. She exhibits no distension. There is no tenderness.  Musculoskeletal: Normal range of motion.  Medial aspect of the distal flange to left ring finger 1 cm curved laceration.  Good flexion and abduction to the DIP, PIP and MCP.   Neurological: She is alert and oriented to person, place, and time. No cranial nerve deficit or sensory deficit. She exhibits normal muscle tone. Coordination normal.  Psychiatric: She has a normal mood and affect.    Nursing note and vitals reviewed.    ED Treatments / Results  COORDINATION OF CARE: 8:16 PM-Discussed next steps with pt. Pt verbalized understanding and is agreeable with the plan.  Results for orders placed or performed during the hospital encounter of 06/13/16  CBC with Differential  Result Value Ref Range   WBC 8.4 4.0 - 10.5 K/uL   RBC 4.80 3.87 - 5.11 MIL/uL   Hemoglobin 14.6 12.0 - 15.0 g/dL   HCT 16.1 09.6 - 04.5 %   MCV 89.4 78.0 - 100.0 fL   MCH 30.4 26.0 - 34.0 pg   MCHC 34.0 30.0 - 36.0 g/dL   RDW 40.9 81.1 - 91.4 %   Platelets 393 150 - 400 K/uL   Neutrophils Relative % 67 %   Neutro Abs 5.6 1.7 - 7.7 K/uL   Lymphocytes Relative 27 %   Lymphs Abs 2.2 0.7 - 4.0 K/uL   Monocytes Relative 5 %   Monocytes Absolute 0.4 0.1 - 1.0 K/uL   Eosinophils Relative 1 %   Eosinophils Absolute 0.1 0.0 - 0.7 K/uL   Basophils Relative 0 %   Basophils Absolute 0.0 0.0 - 0.1 K/uL  Basic metabolic panel  Result Value Ref Range   Sodium 137 135 - 145 mmol/L   Potassium 4.2 3.5 - 5.1 mmol/L   Chloride 104 101 - 111 mmol/L   CO2 28 22 - 32 mmol/L   Glucose, Bld 112 (H) 65 - 99 mg/dL   BUN 10 6 - 20 mg/dL   Creatinine, Ser 7.82 0.44 - 1.00 mg/dL   Calcium 8.6 (L) 8.9 - 10.3 mg/dL   GFR calc non Af Amer >60 >60 mL/min   GFR calc Af Amer >60 >60 mL/min   Anion gap 5 5 - 15  D-dimer, quantitative (not at [facility])  Result Value Ref Range   D-Dimer, Quant <0.27 0.00 - 0.50 ug/mL-FEU   No results found.  EKG  EKG Interpretation None       Radiology No results found.  Procedures Procedures (including critical care time)  Medications Ordered in ED Medications  lidocaine (PF) (XYLOCAINE) 1 % injection 5 mL (5 mLs Infiltration Given 09/22/16 2022)  Tdap (BOOSTRIX) injection 0.5 mL (0.5 mLs Intramuscular Given 09/22/16 2024)  sodium bicarbonate (NEUT) 4 % injection 5 mL (5 mLs Subcutaneous Given 09/22/16 2024)     Initial Impression / Assessment and Plan / ED Course  I  have reviewed the triage vital signs and the nursing notes.  Pertinent labs & imaging results that were available during my care of the patient were reviewed by me and considered in my medical decision making (see chart for details).     Patient with finger laceration. Wound closed. Mild paresthesias but doubt severe injury. Discharge home with sutures out in 7-10 days.  LACERATION REPAIR Performed by: Billee Cashing Authorized by: Billee Cashing Consent: Verbal consent obtained. Risks and benefits: risks, benefits and alternatives were discussed  Consent given by: patient Patient identity confirmed: provided demographic data Prepped and Draped in normal sterile fashion Wound explored  Laceration Location: left ring finger  Laceration Length: 1cm  No Foreign Bodies seen or palpated  Anesthesia: digital block  Local anesthetic: lidocaine 1% with neut  Anesthetic total: 3 ml  Irrigation method: syringe Amount of cleaning: standard  Skin closure:5-0 vicryl rapide  Number of sutures: 7  Technique: simple interupted  Patient tolerance: Patient tolerated the procedure well with no immediate complications.  Final Clinical Impressions(s) / ED Diagnoses   Final diagnoses:  Laceration of left ring finger without foreign body without damage to nail, initial encounter    New Prescriptions New Prescriptions   No medications on file  I personally performed the services described in this documentation, which was scribed in my presence. The recorded information has been reviewed and is accurate.       Alice Core, MD 09/22/16 2110

## 2016-09-22 NOTE — ED Triage Notes (Addendum)
Pt c/o cut left ring finger on metal can approx 20 min PTA-lac noted-bleeding-gauze wrap applied-NAD-steady gait-pt unsure if WC-advised to Hotel managercall manager and let tx nurse know-agreed

## 2016-09-22 NOTE — Discharge Instructions (Signed)
Sutures out in 7-10 days.

## 2017-08-01 ENCOUNTER — Encounter (HOSPITAL_BASED_OUTPATIENT_CLINIC_OR_DEPARTMENT_OTHER): Payer: Self-pay | Admitting: *Deleted

## 2017-08-01 ENCOUNTER — Other Ambulatory Visit: Payer: Self-pay

## 2017-08-01 ENCOUNTER — Emergency Department (HOSPITAL_BASED_OUTPATIENT_CLINIC_OR_DEPARTMENT_OTHER)
Admission: EM | Admit: 2017-08-01 | Discharge: 2017-08-01 | Disposition: A | Discharge status: Home / Self Care | Payer: BLUE CROSS/BLUE SHIELD | Attending: Emergency Medicine | Admitting: Emergency Medicine

## 2017-08-01 ENCOUNTER — Emergency Department (HOSPITAL_BASED_OUTPATIENT_CLINIC_OR_DEPARTMENT_OTHER): Payer: BLUE CROSS/BLUE SHIELD

## 2017-08-01 DIAGNOSIS — Y9389 Activity, other specified: Secondary | ICD-10-CM | POA: Insufficient documentation

## 2017-08-01 DIAGNOSIS — I10 Essential (primary) hypertension: Secondary | ICD-10-CM | POA: Diagnosis not present

## 2017-08-01 DIAGNOSIS — S60221A Contusion of right hand, initial encounter: Secondary | ICD-10-CM

## 2017-08-01 DIAGNOSIS — Y9289 Other specified places as the place of occurrence of the external cause: Secondary | ICD-10-CM | POA: Diagnosis not present

## 2017-08-01 DIAGNOSIS — F17210 Nicotine dependence, cigarettes, uncomplicated: Secondary | ICD-10-CM | POA: Diagnosis not present

## 2017-08-01 DIAGNOSIS — W230XXA Caught, crushed, jammed, or pinched between moving objects, initial encounter: Secondary | ICD-10-CM | POA: Insufficient documentation

## 2017-08-01 DIAGNOSIS — Z79899 Other long term (current) drug therapy: Secondary | ICD-10-CM | POA: Insufficient documentation

## 2017-08-01 DIAGNOSIS — S6991XA Unspecified injury of right wrist, hand and finger(s), initial encounter: Secondary | ICD-10-CM | POA: Diagnosis present

## 2017-08-01 DIAGNOSIS — Y990 Civilian activity done for income or pay: Secondary | ICD-10-CM | POA: Insufficient documentation

## 2017-08-01 DIAGNOSIS — M79641 Pain in right hand: Secondary | ICD-10-CM

## 2017-08-01 DIAGNOSIS — S62336A Displaced fracture of neck of fifth metacarpal bone, right hand, initial encounter for closed fracture: Secondary | ICD-10-CM | POA: Insufficient documentation

## 2017-08-01 IMAGING — CR DG HAND COMPLETE 3+V*R*
3 series · 3 of 3 positions shown · non-contrast
Comparison: None.

CLINICAL DATA: Hand closed in door with pain, initial encounter

EXAM:
RIGHT HAND - COMPLETE 3+ VIEW

[x hand pa right]
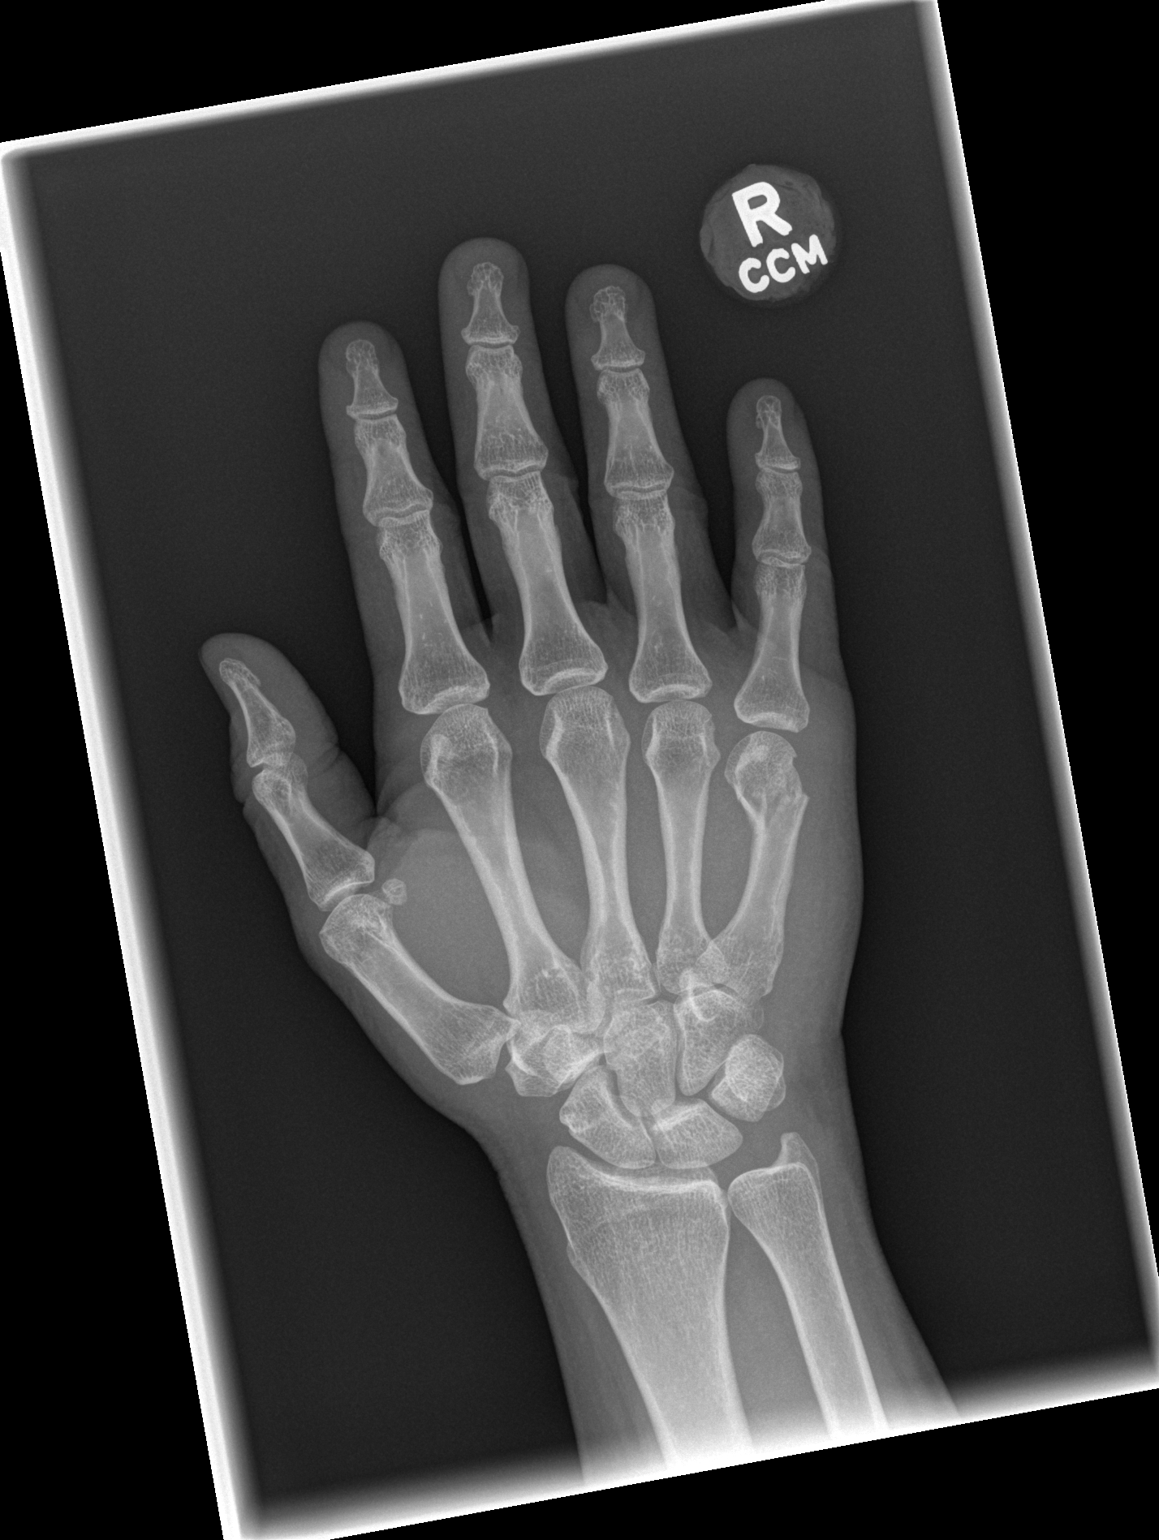

[x hand oblique right]
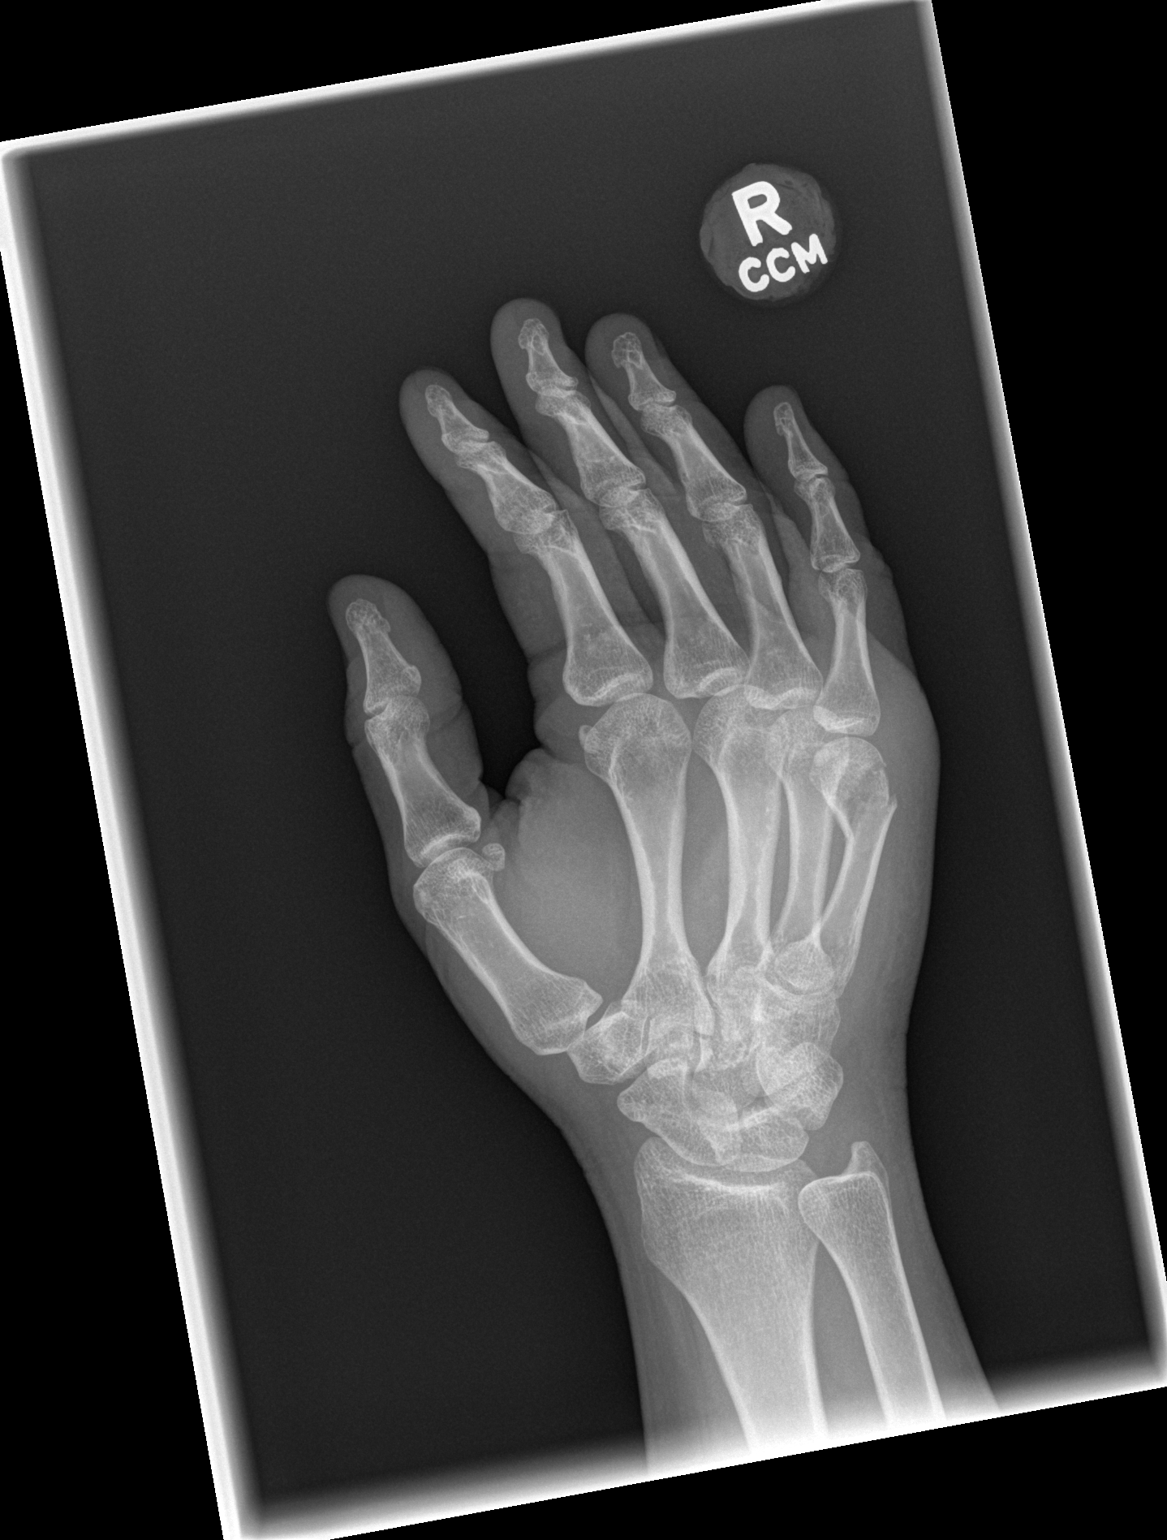

[x hand lat right]
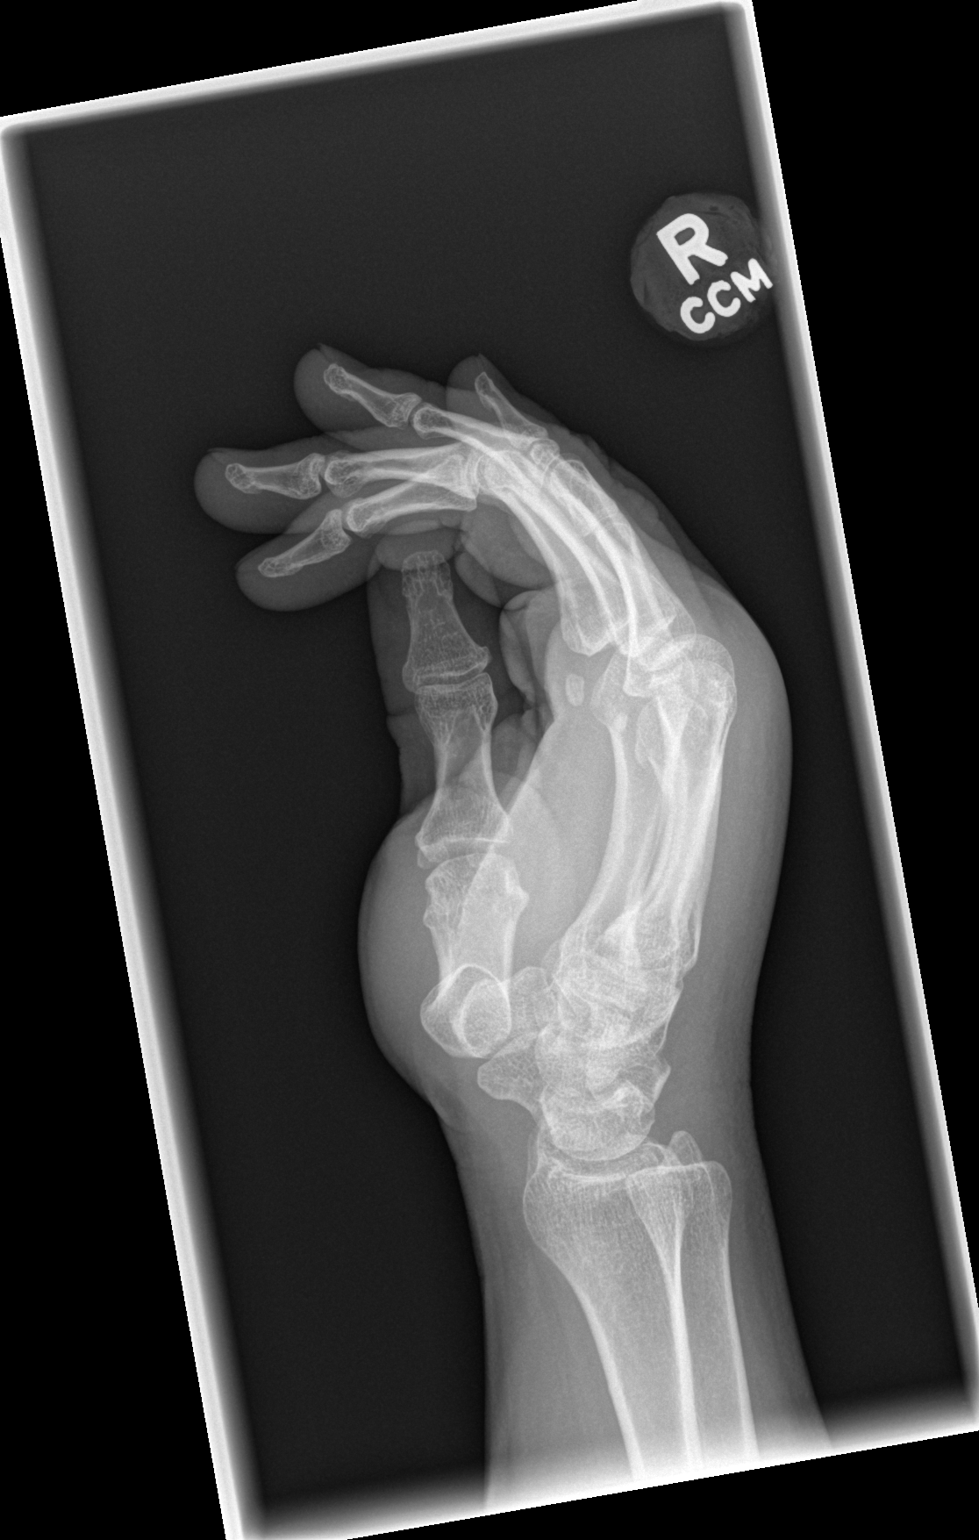

[3 of 3 positions shown; findings below may reference images not displayed]

FINDINGS: There is an angulated fracture in the distal aspect of the fifth
metacarpal. Considerable soft tissue swelling is noted. No other
fracture is seen.
IMPRESSION: Fifth metacarpal fracture with soft tissue swelling.

## 2017-08-01 MED ORDER — NAPROXEN 500 MG PO TABS: 500.0000 mg | ORAL_TABLET | Freq: Two times a day (BID) | ORAL | 0 refills | Status: DC | PRN

## 2017-08-01 MED ORDER — HYDROCODONE-ACETAMINOPHEN 5-325 MG PO TABS: 1.0000 | ORAL_TABLET | Freq: Four times a day (QID) | ORAL | 0 refills | Status: DC | PRN

## 2017-08-01 NOTE — Discharge Instructions (Addendum)
Wear splint at all times until you see the hand specialist. Ice and elevate hand throughout the day, using ice pack for no more than 20 minutes every hour.  Alternate between naprosyn and norco for pain relief. Do not drive or operate machinery with pain medication use. Follow up with the hand specialist in the next 5-7 days for recheck of symptoms and ongoing management of your injury. Return to the ER for changes or worsening symptoms.

## 2017-08-01 NOTE — ED Notes (Signed)
Pt verbalizes understand. Will follow up with hand surgery in 5-7 days. No questions

## 2017-08-01 NOTE — ED Notes (Signed)
ED Provider at bedside. 

## 2017-08-01 NOTE — ED Provider Notes (Signed)
[facility] EMERGENCY DEPARTMENT Provider Note   CSN: [id number] Arrival date & time: 08/01/17  1830     History   Chief Complaint Chief Complaint  Patient presents with  . Hand Injury    HPI Alice Martinez is a 42 y.o. R-handed female with a PMHx of HTN, HLD, ADHD, and other conditions listed below, who presents to the ED with complaints of right hand injury sustained around 8 AM, approximately 11 hours prior to evaluation.  Patient states that she was at work when the walk-in freezer/cooler door accidentally got closed on her right hand.  She now complains of 7/10 constant throbbing and sharp right hand pain that radiates up the arm, worse with movement or use of the hand, and unrelieved with Advil and ice.  She reports associated swelling and bruising.  She denies any wounds or abrasions, numbness, tingling, focal weakness, or any other injuries or complaints at this time.  She states this will not be a worker's comp injury, it happened right before work actually started, although she was at her work place.  She does not have a hand specialist she has seen, she previously saw [facility] for a foot injury many years ago but none recently.   The history is provided by the patient and medical records. No language interpreter was used.  Hand Injury   The incident occurred 6 to 12 hours ago. The incident occurred at work. The injury mechanism was compression. The pain is present in the right hand. The quality of the pain is described as throbbing and sharp. The pain is at a severity of 7/10. The pain is moderate. The pain has been constant since the incident. She reports no foreign bodies present. The symptoms are aggravated by movement and use. She has tried NSAIDs and ice for the symptoms. The treatment provided no relief.    Past Medical History:  Diagnosis Date  . ADHD (attention deficit hyperactivity disorder)   . Depression   . High cholesterol   . Hypertension      Patient Active Problem List   Diagnosis Date Noted  . PTSD (post-traumatic stress disorder) 05/27/2016  . Dysthymia 05/27/2016  . GAD (generalized anxiety disorder) 01/15/2016  . Major depressive disorder, recurrent episode, moderate (HCC) 01/15/2016  . Essential hypertension 01/15/2016  . 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 Mixed hyperlipidemia 12/09/2010    Past Surgical History:  Procedure Laterality Date  . TONSILLECTOMY    . TUBAL LIGATION       OB History   None      Home Medications    Prior to Admission medications   Medication Sig Start Date End Date Taking? Authorizing Provider  amphetamine-dextroamphetamine (ADDERALL) 15 MG tablet Take by mouth 2 (two) times daily.  07/14/16   [provider]  FLUoxetine (PROZAC) 20 MG capsule Take by mouth. 05/16/16   [provider]  hydrochlorothiazide (HYDRODIURIL) 25 MG tablet Take 25 mg by mouth daily.    [provider]  zolpidem (AMBIEN) 10 MG tablet Take by mouth. 05/16/16   [provider]    Family History No family history on file.  Social History Social History   Tobacco Use  . Smoking status: Current Every Day Smoker    Packs/day: 0.50    Types: Cigarettes  . Smokeless tobacco: Never Used  Substance Use Topics  . Alcohol use: Yes    Comment: occ  . Drug use: No     Allergies  Patient has no known allergies.   Review of Systems Review of Systems  Musculoskeletal: Positive for arthralgias and joint swelling.  Skin: Positive for color change. Negative for wound.  Allergic/Immunologic: Negative for immunocompromised state.  Neurological: Negative for weakness and numbness.  Psychiatric/Behavioral: Negative for confusion.     Physical Exam Updated Vital Signs BP (!) 143/92 (BP Location: Right Arm)   Pulse 92   Temp 98 F (36.7 C) (Oral)   Resp 18   Ht 5\' 1"  (1.549 m)   Wt 63.5 kg (140 lb)   LMP 07/11/2017   SpO2 98%   BMI 26.45 kg/m    Physical Exam  Constitutional: She is oriented to person, place, and time. Vital signs are normal. She appears well-developed and well-nourished.  Non-toxic appearance. No distress.  Afebrile, nontoxic, NAD  HENT:  Head: Normocephalic and atraumatic.  Mouth/Throat: Mucous membranes are normal.  Eyes: Conjunctivae and EOM are normal. Right eye exhibits no discharge. Left eye exhibits no discharge.  Neck: Normal range of motion. Neck supple.  Cardiovascular: Normal rate and intact distal pulses.  Pulmonary/Chest: Effort normal. No respiratory distress.  Abdominal: Normal appearance. She exhibits no distension.  Musculoskeletal: Normal range of motion.       Right hand: She exhibits tenderness, bony tenderness and swelling. She exhibits normal range of motion, normal two-point discrimination, normal capillary refill, no deformity and no laceration. Normal sensation noted. Normal strength noted.  R hand with moderate TTP along the 3rd-5th metacarpal region, no focal bony/joint line TTP to wrist or into the forearm, or of the fingers themselves. FROM intact in all digits, wiggles fingers without difficulty.  +Moderate swelling and bruising over the 3rd-5th metacarpal region. No crepitus or deformity, no anatomical snuffbox tenderness, no wounds or fight bites, no erythema or warmth. Strength and sensation grossly intact, distal pulses intact, compartments soft.   Neurological: She is alert and oriented to person, place, and time. She has normal strength. No sensory deficit.  Skin: Skin is warm, dry and intact. Bruising noted. No rash noted.  R hand bruise as mentioned above  Psychiatric: She has a normal mood and affect. Her behavior is normal.  Nursing note and vitals reviewed.    ED Treatments / Results  Labs (all labs ordered are listed, but only abnormal results are displayed) Labs Reviewed - No data to display  EKG None  Radiology Dg Hand Complete Right  Result Date:  08/01/2017 CLINICAL DATA:  Hand closed in door with pain, initial encounter EXAM: RIGHT HAND - COMPLETE 3+ VIEW COMPARISON:  None. FINDINGS: There is an angulated fracture in the distal aspect of the fifth metacarpal. Considerable soft tissue swelling is noted. No other fracture is seen. IMPRESSION: Fifth metacarpal fracture with soft tissue swelling. Electronically Signed   By: Alcide Clever M.D.   On: 08/01/2017 19:14    Procedures Procedures (including critical care time)  SPLINT APPLICATION Date/Time: 7:24 PM Authorized by: Rhona Raider Consent: Verbal consent obtained. Risks and benefits: risks, benefits and alternatives were discussed Consent given by: patient Splint applied by: orthopedic technician Location details: R hand/wrist Splint type: ulnar gutter Supplies used: orthoglass Post-procedure: The splinted body part was neurovascularly unchanged following the procedure. Patient tolerance: Patient tolerated the procedure well with no immediate complications.     Medications Ordered in ED Medications - No data to display   Initial Impression / Assessment and Plan / ED Course  I have reviewed the triage vital signs and the nursing notes.  Pertinent labs &  imaging results that were available during my care of the patient were reviewed by me and considered in my medical decision making (see chart for details).     42 y.o. female here with R hand injury sustained about 11hrs ago, accidentally closed hand in walk-in freezer/cooler door. On exam, moderate TTP along 3rd-5th metacarpals, moderate swelling and bruising to that area, no skin wounds/fight bites, wiggles all fingers without difficulty, FROM intact in all joints of the fingers, NVI with soft compartments, no tenderness to wrist or forearm. Will get xray of R hand, pt declines wanting anything for pain right now, will reassess shortly.   7:24 PM R hand xray confirms angulated fx of distal 5th metacarpal, at the neck  region of the metacarpal, slightly displaced appearing per my review of the images. Will place in ulnar gutter splint, and send home with pain meds. Advised RICE. F/up with hand specialist in 5-7 days for recheck and ongoing management of injury. I explained the diagnosis and have given explicit precautions to return to the ER including for any other new or worsening symptoms. The patient understands and accepts the medical plan as it's been dictated and I have answered their questions. Discharge instructions concerning home care and prescriptions have been given. The patient is STABLE and is discharged to home in good condition.   [place] reviewed prior to dispensing controlled substance medications, and 2 year search was notable for: adderall and zolpidem rx's somewhat routinely over the years, occasional lorazepam 0.5mg  rx's (last on 04/12/17), a few hydromet syrup rx's (last on 04/27/17), but no other narcotics found. Risks/benefits/alternatives and expectations discussed regarding controlled substances. Side effects of medications discussed. Informed consent obtained.    Final Clinical Impressions(s) / ED Diagnoses   Final diagnoses:  Closed displaced fracture of neck of fifth metacarpal bone of right hand, initial encounter  Contusion of right hand, initial encounter  Right hand pain    ED Discharge Orders        Ordered    naproxen (NAPROSYN) 500 MG tablet  2 times daily PRN     08/01/17 1919    HYDROcodone-acetaminophen (NORCO) 5-325 MG tablet  Every 6 hours PRN     08/01/17 [address]1919       Ladrea Holladay, BurleyMercedes, [place] JerseyPA-C 08/01/17 1925    Terrilee FilesButler, Michael C, MD 08/02/17 [phone]

## 2017-08-01 NOTE — ED Triage Notes (Signed)
Pt reports slamming a cooler door on her right hand this morning, she could not leave work as there was no one to cover her. Palm side of hand is swollen and bruised and back side of hand is swollen, red and tender to touch. + mvt of fingers, + radial pulse.

## 2019-03-10 ENCOUNTER — Encounter (HOSPITAL_BASED_OUTPATIENT_CLINIC_OR_DEPARTMENT_OTHER): Payer: Self-pay | Admitting: Emergency Medicine

## 2019-03-10 ENCOUNTER — Other Ambulatory Visit: Payer: Self-pay

## 2019-03-10 ENCOUNTER — Emergency Department (HOSPITAL_BASED_OUTPATIENT_CLINIC_OR_DEPARTMENT_OTHER)
Admission: EM | Admit: 2019-03-10 | Discharge: 2019-03-10 | Disposition: A | Discharge status: Home / Self Care | Payer: BLUE CROSS/BLUE SHIELD | Attending: Emergency Medicine | Admitting: Emergency Medicine

## 2019-03-10 DIAGNOSIS — B349 Viral infection, unspecified: Secondary | ICD-10-CM

## 2019-03-10 DIAGNOSIS — F17210 Nicotine dependence, cigarettes, uncomplicated: Secondary | ICD-10-CM | POA: Insufficient documentation

## 2019-03-10 DIAGNOSIS — Z20828 Contact with and (suspected) exposure to other viral communicable diseases: Secondary | ICD-10-CM | POA: Insufficient documentation

## 2019-03-10 DIAGNOSIS — Z79899 Other long term (current) drug therapy: Secondary | ICD-10-CM | POA: Insufficient documentation

## 2019-03-10 DIAGNOSIS — K5900 Constipation, unspecified: Secondary | ICD-10-CM

## 2019-03-10 DIAGNOSIS — I10 Essential (primary) hypertension: Secondary | ICD-10-CM | POA: Insufficient documentation

## 2019-03-10 DIAGNOSIS — F419 Anxiety disorder, unspecified: Secondary | ICD-10-CM | POA: Insufficient documentation

## 2019-03-10 DIAGNOSIS — E876 Hypokalemia: Secondary | ICD-10-CM

## 2019-03-10 LAB — CBC WITH DIFFERENTIAL/PLATELET
Abs Immature Granulocytes: 0.02 10*3/uL (ref 0.00–0.07)
Basophils Absolute: 0.1 10*3/uL (ref 0.0–0.1)
Basophils Relative: 1 %
Eosinophils Absolute: 0.2 10*3/uL (ref 0.0–0.5)
Eosinophils Relative: 2 %
HCT: 45.4 % (ref 36.0–46.0)
Hemoglobin: 15.2 g/dL — ABNORMAL HIGH (ref 12.0–15.0)
Immature Granulocytes: 0 %
Lymphocytes Relative: 25 %
Lymphs Abs: 1.9 10*3/uL (ref 0.7–4.0)
MCH: 29.2 pg (ref 26.0–34.0)
MCHC: 33.5 g/dL (ref 30.0–36.0)
MCV: 87.3 fL (ref 80.0–100.0)
Monocytes Absolute: 0.3 10*3/uL (ref 0.1–1.0)
Monocytes Relative: 4 %
Neutro Abs: 5.1 10*3/uL (ref 1.7–7.7)
Neutrophils Relative %: 68 %
Platelets: 342 10*3/uL (ref 150–400)
RBC: 5.2 MIL/uL — ABNORMAL HIGH (ref 3.87–5.11)
RDW: 12.3 % (ref 11.5–15.5)
WBC: 7.5 10*3/uL (ref 4.0–10.5)
nRBC: 0 % (ref 0.0–0.2)

## 2019-03-10 LAB — COMPREHENSIVE METABOLIC PANEL
ALT: 19 U/L (ref 0–44)
AST: 19 U/L (ref 15–41)
Albumin: 4.2 g/dL (ref 3.5–5.0)
Alkaline Phosphatase: 48 U/L (ref 38–126)
Anion gap: 11 (ref 5–15)
BUN: 19 mg/dL (ref 6–20)
CO2: 28 mmol/L (ref 22–32)
Calcium: 9.1 mg/dL (ref 8.9–10.3)
Chloride: 98 mmol/L (ref 98–111)
Creatinine, Ser: 0.83 mg/dL (ref 0.44–1.00)
GFR calc Af Amer: >60 mL/min (ref >60)
GFR calc non Af Amer: >60 mL/min (ref >60)
Glucose, Bld: 133 mg/dL — ABNORMAL HIGH (ref 70–99)
Potassium: 2.7 mmol/L — CL (ref 3.5–5.1)
Sodium: 137 mmol/L (ref 135–145)
Total Bilirubin: 0.4 mg/dL (ref 0.3–1.2)
Total Protein: 6.9 g/dL (ref 6.5–8.1)

## 2019-03-10 LAB — URINALYSIS, ROUTINE W REFLEX MICROSCOPIC
Glucose, UA: NEGATIVE mg/dL
Hgb urine dipstick: NEGATIVE
Ketones, ur: NEGATIVE mg/dL
Leukocytes,Ua: NEGATIVE
Nitrite: NEGATIVE
Protein, ur: NEGATIVE mg/dL
Specific Gravity, Urine: 1.025 (ref 1.005–1.030)
pH: 6 (ref 5.0–8.0)

## 2019-03-10 LAB — PREGNANCY, URINE: Preg Test, Ur: NEGATIVE

## 2019-03-10 LAB — MAGNESIUM: Magnesium: 2 mg/dL (ref 1.7–2.4)

## 2019-03-10 MED ORDER — POTASSIUM CHLORIDE CRYS ER 20 MEQ PO TBCR
40.0000 meq | EXTENDED_RELEASE_TABLET | Freq: Once | ORAL | Status: AC
Start: 2019-03-10 — End: 2019-03-10
  Administered 2019-03-10: 18:00:00 40 meq via ORAL
  Filled 2019-03-10: qty 2

## 2019-03-10 MED ORDER — POTASSIUM CHLORIDE CRYS ER 20 MEQ PO TBCR: 20.0000 meq | EXTENDED_RELEASE_TABLET | Freq: Two times a day (BID) | ORAL | 0 refills | Status: DC

## 2019-03-10 MED ORDER — POTASSIUM CHLORIDE 10 MEQ/100ML IV SOLN
10.0000 meq | Freq: Once | INTRAVENOUS | Status: DC
  Filled 2019-03-10: qty 100

## 2019-03-10 MED ORDER — LACTATED RINGERS IV BOLUS
1000.0000 mL | Freq: Once | INTRAVENOUS | Status: AC
  Administered 2019-03-10: 1000 mL via INTRAVENOUS

## 2019-03-10 NOTE — ED Triage Notes (Signed)
Headache, sore throat, dizziness, abd pain, constipation for several days.

## 2019-03-10 NOTE — ED Notes (Addendum)
K+ 2.7, results given to ED MD and triage RN

## 2019-03-10 NOTE — ED Notes (Signed)
ED Provider at bedside. 

## 2019-03-10 NOTE — ED Provider Notes (Addendum)
[facility] EMERGENCY DEPARTMENT Provider Note   CSN: [id number] Arrival date & time: 03/10/19  1519     History   Chief Complaint Chief Complaint  Patient presents with  . Multiple complaints    HPI Alice Martinez is a 43 y.o. female HLD, HTN, depression, anxiety      HPI  Patient presents today for multiple complaints including congestion, abdominal pain, weakness, cramping in her calves, fatigue, lightheadedness, sore throat and constipation.  Patient states that she has a history of allergies and her congestion has been consistent with this however it is been persistent and not improved with over-the-counter medications she has tried so far.  Patient states she has not used fluticasone however which she has been prescribed in the past primarily uses OTC antihistamines.  Patient states she has had several days of abdominal pain is generalized, she attributes this to constipation which she has been having intermittently for the past year more often than not present states that she feels her constipation is due to her anxiety medications.  Patient also endorse cramping in her back and calves.  Patient states cramping has been intermittent over the past couple days.   Patient denies any vomiting, diarrhea, does endorse decreased food intake.  States she has had no appetite however states she does not have decreased taste or smell.  Past Medical History:  Diagnosis Date  . ADHD (attention deficit hyperactivity disorder)   . Depression   . High cholesterol   . Hypertension     Patient Active Problem List   Diagnosis Date Noted  . PTSD (post-traumatic stress disorder) 05/27/2016  . Dysthymia 05/27/2016  . GAD (generalized anxiety disorder) 01/15/2016  . Major depressive disorder, recurrent episode, moderate (HCC) 01/15/2016  . Essential hypertension 01/15/2016  . 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 Mixed hyperlipidemia 12/09/2010    Past Surgical  History:  Procedure Laterality Date  . TONSILLECTOMY    . TUBAL LIGATION       OB History   No obstetric history on file.      Home Medications    Prior to Admission medications   Medication Sig Start Date End Date Taking? Authorizing Provider  amphetamine-dextroamphetamine (ADDERALL) 20 MG tablet Take one tab po every AM at 6am and one tab po at 10am and one half tab po at 1pm 02/25/19  Yes [provider]  hydrochlorothiazide (HYDRODIURIL) 25 MG tablet Take by mouth. 01/28/19  Yes [provider]  venlafaxine XR (EFFEXOR-XR) 75 MG 24 hr capsule Take by mouth. 01/28/19  Yes [provider]  zolpidem (AMBIEN) 10 MG tablet Take by mouth. 05/16/16  Yes [provider]  zolpidem (AMBIEN) 10 MG tablet Take by mouth. 05/05/16  Yes [provider]  amphetamine-dextroamphetamine (ADDERALL) 15 MG tablet Take by mouth 2 (two) times daily.  07/14/16   [provider]  FLUoxetine (PROZAC) 20 MG capsule Take by mouth. 05/16/16   [provider]  hydrochlorothiazide (HYDRODIURIL) 25 MG tablet Take 25 mg by mouth daily.    [provider]  HYDROcodone-acetaminophen (NORCO) 5-325 MG tablet Take 1 tablet by mouth every 6 (six) hours as needed for severe pain. 08/01/17   Street, [place], PA-C  naproxen (NAPROSYN) 500 MG tablet Take 1 tablet (500 mg total) by mouth 2 (two) times daily as needed for mild pain, moderate pain or headache (TAKE WITH MEALS.). 08/01/17   Street, [place], PA-C  potassium chloride SA (KLOR-CON) 20 MEQ tablet Take 1 tablet (20  mEq total) by mouth 2 (two) times daily for 4 days. 03/10/19 03/14/19  Gailen Shelter, PA  sertraline (ZOLOFT) 25 MG tablet Take 25 mg by mouth daily. 02/13/19   [provider]    Family History No family history on file.  Social History Social History   Tobacco Use  . Smoking status: Current Every Day Smoker    Packs/day: 0.50    Types: Cigarettes  . Smokeless tobacco:  Never Used  Substance Use Topics  . Alcohol use: Yes    Comment: occ  . Drug use: No     Allergies   Patient has no known allergies.   Review of Systems Review of Systems  Constitutional: Negative for chills and fever.  HENT: Positive for congestion.   Respiratory: Negative for cough.   Gastrointestinal: Positive for abdominal pain and constipation. Negative for diarrhea and nausea.  Genitourinary: Negative for dysuria.  Musculoskeletal: Positive for back pain and myalgias.  Neurological: Positive for light-headedness. Negative for dizziness.  All other systems reviewed and are negative.    Physical Exam Updated Vital Signs BP 132/73   Pulse 78   Temp 98.5 F (36.9 C) (Oral)   Resp 16   Ht  (1.549 m)   Wt 63.5 kg   LMP 02/27/2019   SpO2 100%   BMI 26.45 kg/m   Physical Exam Vitals signs and nursing note reviewed.  Constitutional:      General: She is not in acute distress. HENT:     Head: Normocephalic and atraumatic.     Nose: Nose normal.     Mouth/Throat:     Mouth: Mucous membranes are dry.     Pharynx: No oropharyngeal exudate.     Comments: No posterior pharynx erythema, patient post tonsillectomy. Eyes:     General: No scleral icterus.    Extraocular Movements: Extraocular movements intact.     Pupils: Pupils are equal, round, and reactive to light.  Neck:     Musculoskeletal: Normal range of motion and neck supple. No muscular tenderness.  Cardiovascular:     Rate and Rhythm: Normal rate and regular rhythm.     Pulses: Normal pulses.     Heart sounds: Normal heart sounds.  Pulmonary:     Effort: Pulmonary effort is normal. No respiratory distress.     Breath sounds: No wheezing.  Abdominal:     Palpations: Abdomen is soft.     Tenderness: There is no abdominal tenderness.  Musculoskeletal:     Right lower leg: No edema.     Left lower leg: No edema.  Lymphadenopathy:     Cervical: No cervical adenopathy.  Skin:    General: Skin is  warm and dry.     Capillary Refill: Capillary refill takes less than 2 seconds.  Neurological:     Mental Status: She is alert. Mental status is at baseline.  Psychiatric:        Mood and Affect: Mood normal.        Behavior: Behavior normal.      ED Treatments / Results  Labs (all labs ordered are listed, but only abnormal results are displayed) Labs Reviewed  CBC WITH DIFFERENTIAL/PLATELET - Abnormal; Notable for the following components:      Result Value   RBC 5.20 (*)    Hemoglobin 15.2 (*)    All other components within normal limits  COMPREHENSIVE METABOLIC PANEL - Abnormal; Notable for the following components:   Potassium 2.7 (*)  Glucose, Bld 133 (*)    All other components within normal limits  URINALYSIS, ROUTINE W REFLEX MICROSCOPIC - Abnormal; Notable for the following components:   APPearance HAZY (*)    Bilirubin Urine SMALL (*)    All other components within normal limits  SARS CORONAVIRUS 2 (TAT 6-24 HRS)  PREGNANCY, URINE  MAGNESIUM    EKG EKG Interpretation  Date/Time:  Sunday March 10 2019 16:49:43 EST Ventricular Rate:  95 PR Interval:  120 QRS Duration: 76 QT Interval:  366 QTC Calculation: 459 R Axis:   68 Text Interpretation: Normal sinus rhythm Nonspecific ST abnormality Abnormal ECG No significant change since last tracing Confirmed by Quintella Reichert [phone]) on 03/10/2019 4:55:19 PM   Radiology No results found.  Procedures Procedures (including critical care time)  Medications Ordered in ED Medications  potassium chloride SA (KLOR-CON) CR tablet 40 mEq (40 mEq Oral Given 03/10/19 1829)  lactated ringers bolus 1,000 mL (0 mLs Intravenous Stopped 03/10/19 1957)     Initial Impression / Assessment and Plan / ED Course  I have reviewed the triage vital signs and the nursing notes.  Pertinent labs & imaging results that were available during my care of the patient were reviewed by me and considered in my medical decision making  (see chart for details).        Patient is well-appearing 43 year old female who has multiple complaints today.  Patient found to be severely hypokalemic at 2.7 patient is currently on HCTZ however she had normal potassium at her several weeks ago.  Uncertain of reason for her low potassium.  Patient given 40 mEq p.o. potassium and 1 L LR to replete.  Discharged with 5 days of 20 mEq twice daily potassium.  Will follow-up for primary care provider as soon as possible.  No other abnormalities found on labs.  EKG within normal limits no U waves, QT within normal limits.  No arrhythmia.  Patient vital signs are within normal limits, abdominal exam is benign.  No focal findings on physical exam or history concerning for any particular emergent pathology.  Patient likely has COVID-19 but deferred testing.  I discussed this case with my attending physician who cosigned this note including patient's presenting symptoms, physical exam, and planned diagnostics and interventions. Attending physician stated agreement with plan or made changes to plan which were implemented.   Attending physician assessed patient at bedside.   The patient appears reasonably screened and/or stabilized for discharge and I doubt any other medical condition or other [facility] requiring further screening, evaluation, or treatment in the ED at this time prior to discharge.  Patient is hemodynamically stable, in NAD, and able to ambulate in the ED. Pain has been managed or a plan has been made for home management and has no complaints prior to discharge. Patient is comfortable with above plan and is stable for discharge at this time. All questions were answered prior to disposition. Results from the ER workup discussed with the patient face to face and all questions answered to the best of my ability. The patient is safe for discharge with strict return precautions. Patient appears safe for discharge with appropriate follow-up.  Conveyed  my impression with the patient and he voiced understanding and is agreeable to plan.   An After Visit Summary was printed and given to the patient.  Portions of this note were generated with [employer]. Dictation errors may occur despite best attempts at proofreading.     Final Clinical Impressions(s) /  ED Diagnoses   Final diagnoses:  Viral illness  Constipation, unspecified constipation type  Hypokalemia    ED Discharge Orders         Ordered    potassium chloride SA (KLOR-CON) 20 MEQ tablet  2 times daily     03/10/19 2014           Gailen ShelterFondaw, Aaryana Betke S, [place] 03/11/19 0106    Gailen ShelterFondaw, Zaveon Gillen S, PA 03/11/19 0107    Tilden Fossaees, Elizabeth, MD 03/13/19 1014

## 2019-03-10 NOTE — Discharge Instructions (Signed)
Your potassium was low today.  This likely contributed to her overall feeling of fatigue and malaise.  You also likely have a viral illness.  Please drink plenty of fluids and read the below information and instructions for symptom control.  Please take oral potassium pill as prescribed for the next 4 days.  Follow-up with your primary care doctor for recheck.   For constipation Mix 4 doses of miralax into 32 oz of a sports drink like Gatorade. Drink over 2 hours or until you have a good bowel movement. You may repeat once more if needed.

## 2019-03-10 NOTE — ED Notes (Signed)
Pt advised to pick up Rx at pharmacy listed on d/c paperwork. Ambulated to with steady gait

## 2019-03-11 LAB — SARS CORONAVIRUS 2 (TAT 6-24 HRS): SARS Coronavirus 2: NEGATIVE

## 2019-08-01 ENCOUNTER — Ambulatory Visit: Payer: Medicaid Other | Attending: Internal Medicine

## 2020-04-15 ENCOUNTER — Encounter (HOSPITAL_COMMUNITY): Payer: Self-pay

## 2020-04-15 ENCOUNTER — Emergency Department (HOSPITAL_COMMUNITY): Payer: Self-pay

## 2020-04-15 ENCOUNTER — Observation Stay (HOSPITAL_COMMUNITY)
Admission: EM | Admit: 2020-04-15 | Discharge: 2020-04-16 | Disposition: A | Discharge status: Home / Self Care | Payer: Self-pay | Attending: Internal Medicine | Admitting: Internal Medicine

## 2020-04-15 DIAGNOSIS — F4310 Post-traumatic stress disorder, unspecified: Secondary | ICD-10-CM | POA: Insufficient documentation

## 2020-04-15 DIAGNOSIS — F1410 Cocaine abuse, uncomplicated: Secondary | ICD-10-CM | POA: Diagnosis present

## 2020-04-15 DIAGNOSIS — R52 Pain, unspecified: Secondary | ICD-10-CM

## 2020-04-15 DIAGNOSIS — M6282 Rhabdomyolysis: Secondary | ICD-10-CM | POA: Diagnosis present

## 2020-04-15 DIAGNOSIS — F17210 Nicotine dependence, cigarettes, uncomplicated: Secondary | ICD-10-CM | POA: Insufficient documentation

## 2020-04-15 DIAGNOSIS — F909 Attention-deficit hyperactivity disorder, unspecified type: Secondary | ICD-10-CM | POA: Insufficient documentation

## 2020-04-15 DIAGNOSIS — F4312 Post-traumatic stress disorder, chronic: Secondary | ICD-10-CM | POA: Diagnosis present

## 2020-04-15 DIAGNOSIS — R55 Syncope and collapse: Secondary | ICD-10-CM | POA: Diagnosis present

## 2020-04-15 DIAGNOSIS — I10 Essential (primary) hypertension: Secondary | ICD-10-CM | POA: Insufficient documentation

## 2020-04-15 DIAGNOSIS — F1490 Cocaine use, unspecified, uncomplicated: Secondary | ICD-10-CM | POA: Insufficient documentation

## 2020-04-15 DIAGNOSIS — Z20822 Contact with and (suspected) exposure to covid-19: Secondary | ICD-10-CM | POA: Insufficient documentation

## 2020-04-15 DIAGNOSIS — R202 Paresthesia of skin: Secondary | ICD-10-CM | POA: Insufficient documentation

## 2020-04-15 DIAGNOSIS — M6281 Muscle weakness (generalized): Principal | ICD-10-CM | POA: Insufficient documentation

## 2020-04-15 DIAGNOSIS — R29898 Other symptoms and signs involving the musculoskeletal system: Secondary | ICD-10-CM

## 2020-04-15 DIAGNOSIS — Z79899 Other long term (current) drug therapy: Secondary | ICD-10-CM | POA: Insufficient documentation

## 2020-04-15 DIAGNOSIS — Z720 Tobacco use: Secondary | ICD-10-CM

## 2020-04-15 DIAGNOSIS — W19XXXA Unspecified fall, initial encounter: Secondary | ICD-10-CM

## 2020-04-15 LAB — RAPID URINE DRUG SCREEN, HOSP PERFORMED
Amphetamines: NOT DETECTED
Barbiturates: NOT DETECTED
Benzodiazepines: POSITIVE — AB
Cocaine: POSITIVE — AB
Opiates: NOT DETECTED
Tetrahydrocannabinol: NOT DETECTED

## 2020-04-15 LAB — DIFFERENTIAL
Abs Immature Granulocytes: 0.05 10*3/uL (ref 0.00–0.07)
Basophils Absolute: 0.1 10*3/uL (ref 0.0–0.1)
Basophils Relative: 0 %
Eosinophils Absolute: 0.1 10*3/uL (ref 0.0–0.5)
Eosinophils Relative: 1 %
Immature Granulocytes: 0 %
Lymphocytes Relative: 17 %
Lymphs Abs: 2 10*3/uL (ref 0.7–4.0)
Monocytes Absolute: 0.9 10*3/uL (ref 0.1–1.0)
Monocytes Relative: 7 %
Neutro Abs: 8.9 10*3/uL — ABNORMAL HIGH (ref 1.7–7.7)
Neutrophils Relative %: 75 %

## 2020-04-15 LAB — CK: Total CK: 1897 U/L — ABNORMAL HIGH (ref 38–234)

## 2020-04-15 LAB — APTT: aPTT: 26 seconds (ref 24–36)

## 2020-04-15 LAB — ETHANOL: Alcohol, Ethyl (B): <10 mg/dL (ref <10)

## 2020-04-15 LAB — CBG MONITORING, ED: Glucose-Capillary: 101 mg/dL — ABNORMAL HIGH (ref 70–99)

## 2020-04-15 LAB — COMPREHENSIVE METABOLIC PANEL
ALT: 45 U/L — ABNORMAL HIGH (ref 0–44)
AST: 49 U/L — ABNORMAL HIGH (ref 15–41)
Albumin: 3.6 g/dL (ref 3.5–5.0)
Alkaline Phosphatase: 62 U/L (ref 38–126)
Anion gap: 9 (ref 5–15)
BUN: 11 mg/dL (ref 6–20)
CO2: 29 mmol/L (ref 22–32)
Calcium: 8.7 mg/dL — ABNORMAL LOW (ref 8.9–10.3)
Chloride: 97 mmol/L — ABNORMAL LOW (ref 98–111)
Creatinine, Ser: 0.69 mg/dL (ref 0.44–1.00)
GFR, Estimated: >60 mL/min (ref >60)
Glucose, Bld: 106 mg/dL — ABNORMAL HIGH (ref 70–99)
Potassium: 3.6 mmol/L (ref 3.5–5.1)
Sodium: 135 mmol/L (ref 135–145)
Total Bilirubin: 0.4 mg/dL (ref 0.3–1.2)
Total Protein: 6.2 g/dL — ABNORMAL LOW (ref 6.5–8.1)

## 2020-04-15 LAB — URINALYSIS, ROUTINE W REFLEX MICROSCOPIC
Bilirubin Urine: NEGATIVE
Glucose, UA: NEGATIVE mg/dL
Hgb urine dipstick: NEGATIVE
Ketones, ur: NEGATIVE mg/dL
Leukocytes,Ua: NEGATIVE
Nitrite: NEGATIVE
Protein, ur: NEGATIVE mg/dL
Specific Gravity, Urine: 1.014 (ref 1.005–1.030)
pH: 5 (ref 5.0–8.0)

## 2020-04-15 LAB — AMMONIA: Ammonia: 23 umol/L (ref 9–35)

## 2020-04-15 LAB — CBC
HCT: 40.3 % (ref 36.0–46.0)
Hemoglobin: 13.5 g/dL (ref 12.0–15.0)
MCH: 29.9 pg (ref 26.0–34.0)
MCHC: 33.5 g/dL (ref 30.0–36.0)
MCV: 89.2 fL (ref 80.0–100.0)
Platelets: 366 10*3/uL (ref 150–400)
RBC: 4.52 MIL/uL (ref 3.87–5.11)
RDW: 13.2 % (ref 11.5–15.5)
WBC: 12 10*3/uL — ABNORMAL HIGH (ref 4.0–10.5)
nRBC: 0 % (ref 0.0–0.2)

## 2020-04-15 LAB — PROTIME-INR
INR: 1 (ref 0.8–1.2)
Prothrombin Time: 12.3 seconds (ref 11.4–15.2)

## 2020-04-15 LAB — I-STAT BETA HCG BLOOD, ED (MC, WL, AP ONLY): I-stat hCG, quantitative: <5 m[IU]/mL (ref <5)

## 2020-04-15 IMAGING — CT CT HEAD W/O CM
4 series · 17 of 47 positions shown, 19 images · non-contrast
Comparison: [DATE].

CLINICAL DATA: Neuro deficit, acute, stroke suspected

EXAM:
CT HEAD WITHOUT CONTRAST
TECHNIQUE: Contiguous axial images were obtained from the base of the skull
through the vertex without intravenous contrast.

[Series 3: head bone · axial · 0.42mm/px · z∈[-73,-19]mm · 4 of 77 slices shown]
[im 8/77  bone]
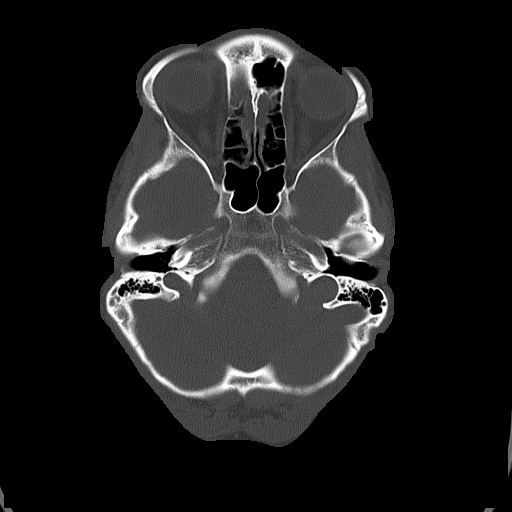
[im 16/77  bone]
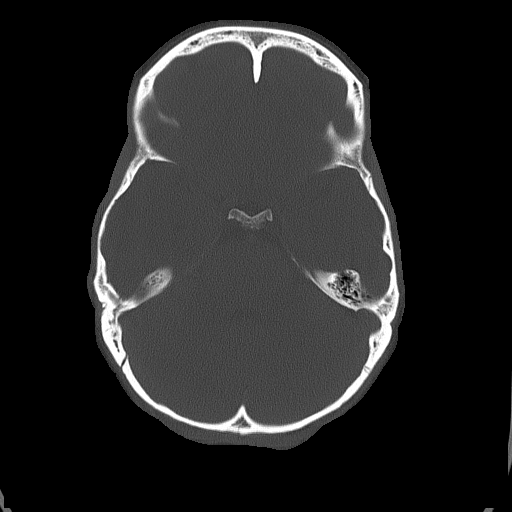
[im 23/77  bone]
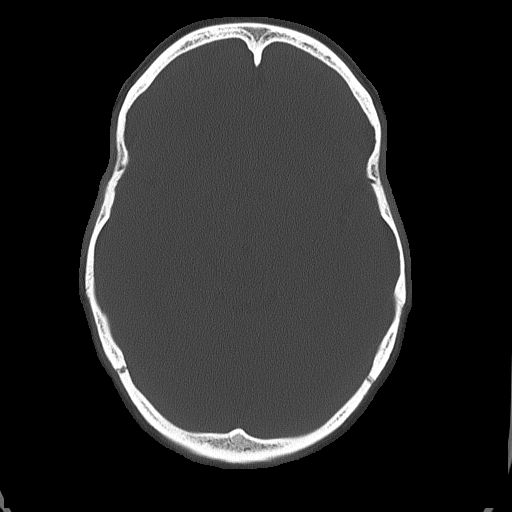
[im 35/77  bone]
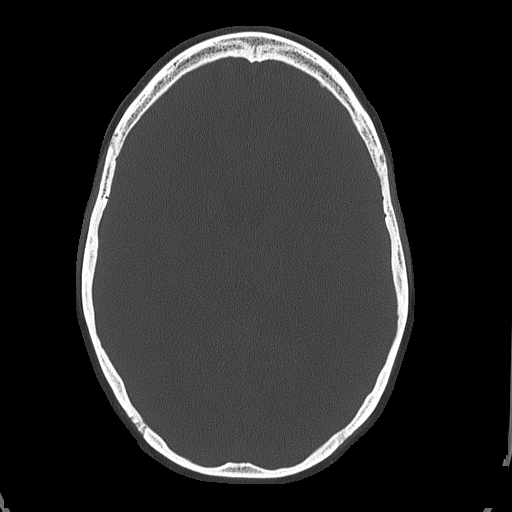

[Series 4: head without · axial · non-contrast · 0.42mm/px · z∈[-72,+43]mm · 7 of 31 slices shown, 9 images]
[im 4/31  brain]
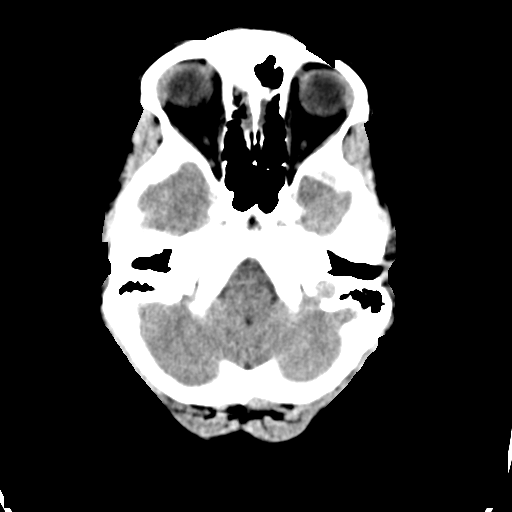
[im 4/31  bone]
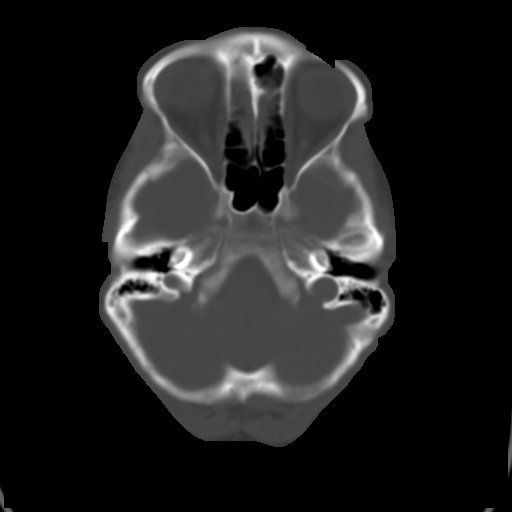
[im 8/31  brain]
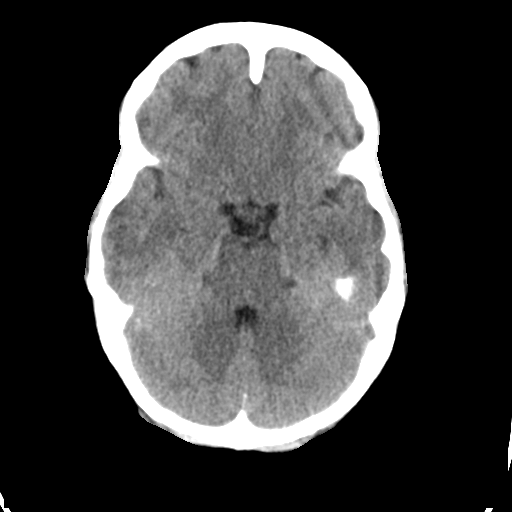
[im 12/31  brain]
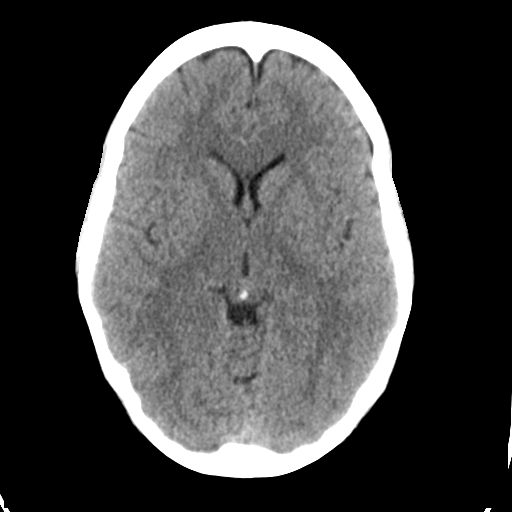
[im 16/31  brain]
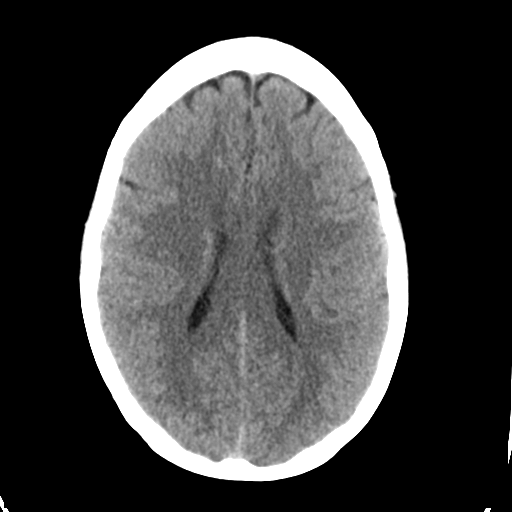
[im 19/31  brain]
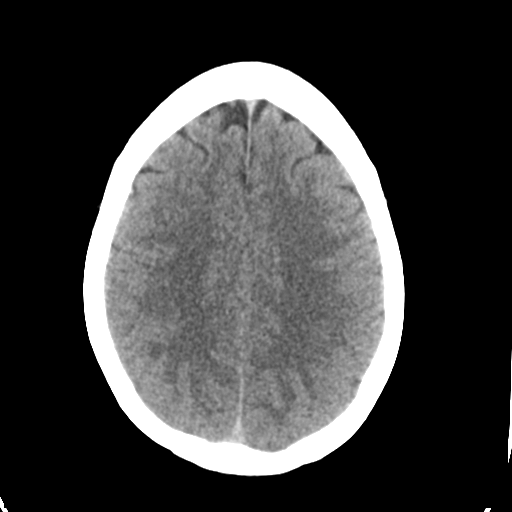
[im 19/31  bone]
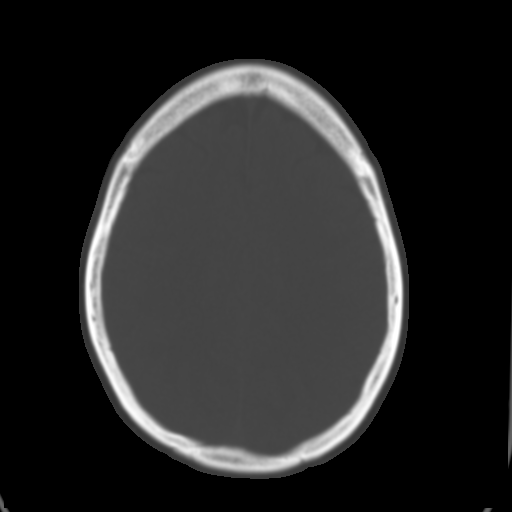
[im 23/31  brain]
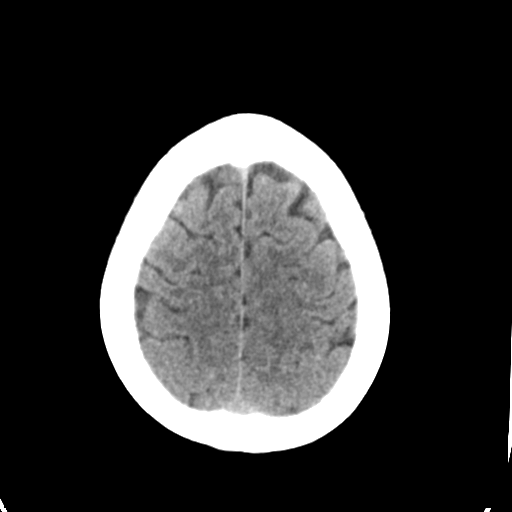
[im 27/31  brain]
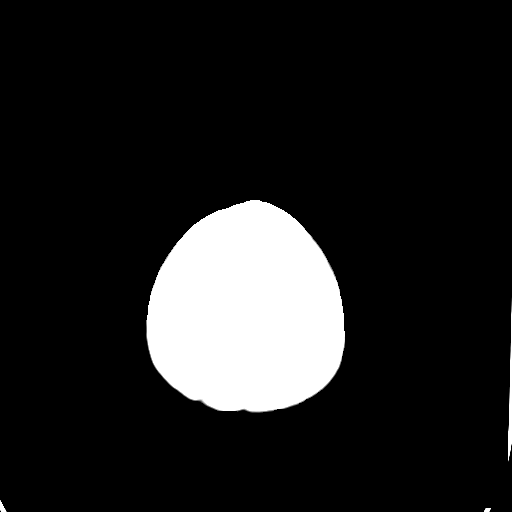

[Series 5: head without cor · coronal · non-contrast · 0.34mm/px · 3 of 69 slices shown]
[im 23/69  brain]
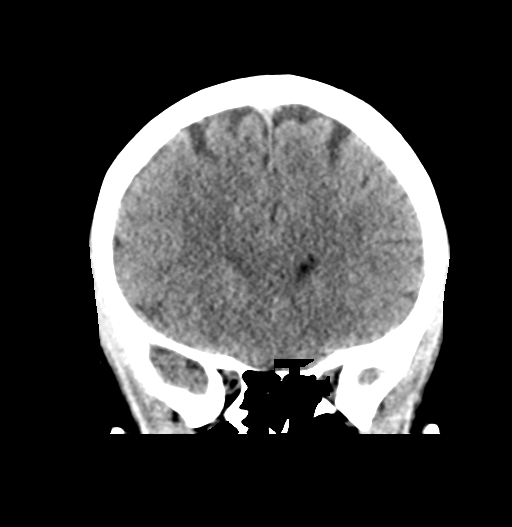
[im 31/69  brain]
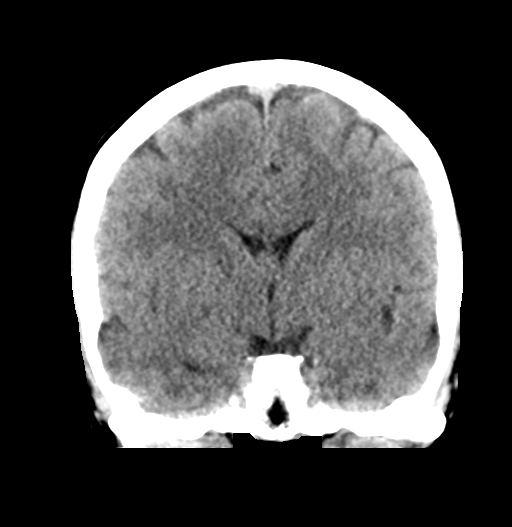
[im 38/69  brain]
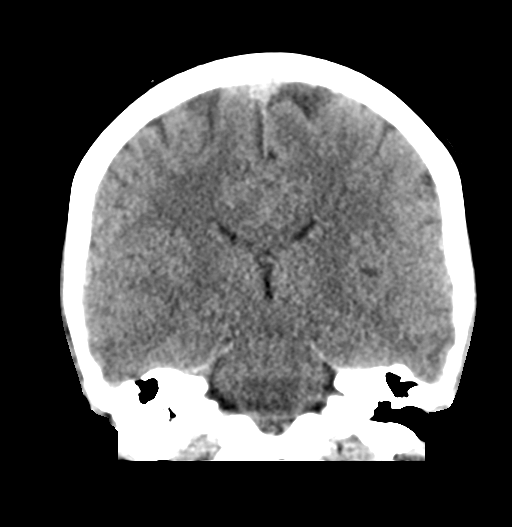

[Series 6: head without sag · sagittal · non-contrast · 0.35mm/px · 3 of 59 slices shown]
[im 20/59  brain]
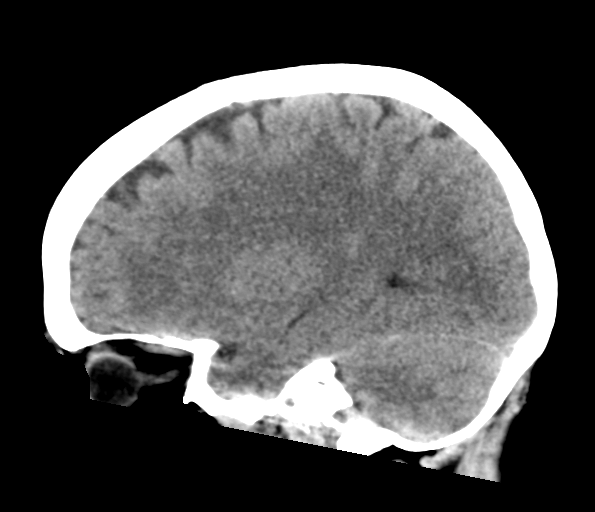
[im 30/59  brain]
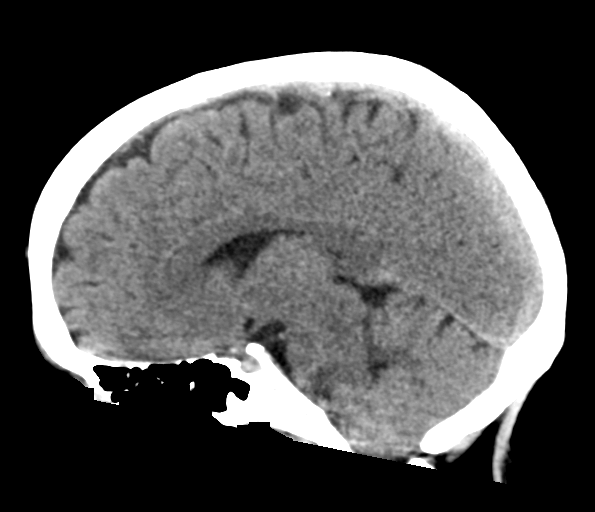
[im 39/59  brain]
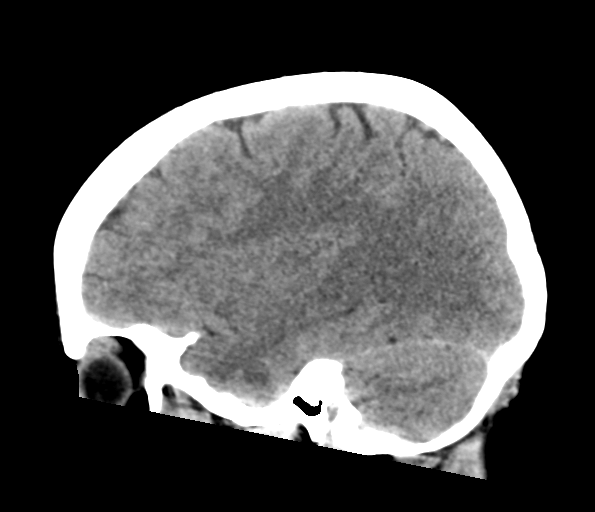

[17 of 47 positions shown; findings below may reference images not displayed]

FINDINGS: Brain: No acute infarct or intracranial hemorrhage. No mass lesion.
No midline shift, ventriculomegaly or extra-axial fluid collection.

Vascular: No hyperdense vessel or unexpected calcification.

Skull: Negative for fracture or focal lesion.

Sinuses/Orbits: Normal orbits. Minimal ethmoid sinus mucosal
thickening. Trace right mastoid effusion.

Other: None.
IMPRESSION: No acute intracranial process.

## 2020-04-15 MED ORDER — NICOTINE 21 MG/24HR TD PT24
21.0000 mg | MEDICATED_PATCH | Freq: Once | TRANSDERMAL | Status: DC
  Administered 2020-04-15: 21 mg via TRANSDERMAL
  Filled 2020-04-15: qty 1

## 2020-04-15 MED ORDER — LACTATED RINGERS IV BOLUS
500.0000 mL | Freq: Once | INTRAVENOUS | Status: AC
  Administered 2020-04-15: 500 mL via INTRAVENOUS

## 2020-04-15 NOTE — Discharge Instructions (Addendum)
Given that you appear to have passed out please do not drive, operate heavy machinery or perform other tasks that would be dangerous to you or to anyone else if you were to pass out or have a seizure until you have been cleared to do so.  Additionally please do not swim or bathe unsupervised.  It only takes a few inches of water to drown.    Your testing was positive for cocaine.  Please stop using cocaine.

## 2020-04-15 NOTE — ED Provider Notes (Signed)
[name]   CSN: [id number] Arrival date & time: 04/15/20  1207     History Chief Complaint  Patient presents with  . Numbness    Alice Martinez is a 44 y.o. female with a past medical history of PTSD, ADHD, depression, hypertension, high cholesterol, who presents today for evaluation of weakness in her right leg. History is obtained from patient, chart review, and her husband who I spoke with on speaker phone with patient's permission.  Husband reported that patient seemed like her speech was slowed since last night.  He noted that patient had some "shaking" episodes overnight.  She frequently has shaking that they attribute to her anxiety however he felt like these were different to the point that he stayed awake to watch her.  He denies any stiffening episodes or noted bowel/bladder incontinence or tongue biting.  He reports that he did feel like he had a hard time waking her during 1 of these episodes and that she would look at him and smile however not verbally respond.  He feels like this is significantly different than her baseline.    Patient states that she had gotten up and was making Kool-Aid to drink in the middle of the night with the poor and mix.  Her husband found her on the floor in the kitchen at about 6 AM this morning.  She had spilled the Kool-Aid everywhere and it was dry in the cup suggesting that she had been there for a longer period of time.  He reports that he feels like she is still verbally slow to respond.  She has been unable to bear full weight on her right leg as it feels weak.  She denies significant pain with this however does note that she has had some cramping aches and pains in her lower back and her trapezius muscles which she attributes to withdrawals from her recent medication changes.  She denies any chiropractic adjustments, no injection drug or recreational drug use or alcohol use.  She denies any  seizure history.  She reports that she has not urinated today.  Chart review shows that on 04/07/2020 she had multiple medication changes.  Per her note from NP Kathlen BrunswickMarsha White "- will taper down on effexor with a goal of stopping while starting on wellbutrin 150mg .  - goal is to get off the effexor and the adderall. - refilled maintenance meds - RTC for worsening of symptoms or difficulty tapering. - Continue to take medications as prescribed."  She has current RX per PMP for adderall, xanax, and ambian.   HPI     Past Medical History:  Diagnosis Date  . ADHD (attention deficit hyperactivity disorder)   . Depression   . High cholesterol   . Hypertension     Patient Active Problem List   Diagnosis Date Noted  . PTSD (post-traumatic stress disorder) 05/27/2016  . Dysthymia 05/27/2016  . GAD (generalized anxiety disorder) 01/15/2016  . Major depressive disorder, recurrent episode, moderate (HCC) 01/15/2016  . Essential hypertension 01/15/2016  . 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 Mixed hyperlipidemia 12/09/2010    Past Surgical History:  Procedure Laterality Date  . TONSILLECTOMY    . TUBAL LIGATION       OB History   No obstetric history on file.     No family history on file.  Social History   Tobacco Use  . Smoking status: Current Every Day Smoker    Packs/day: 0.50  Types: Cigarettes  . Smokeless tobacco: Never Used  Substance Use Topics  . Alcohol use: Yes    Comment: occ  . Drug use: No    Home Medications Prior to Admission medications   Medication Sig Start Date End Date Taking? Authorizing Provider  buPROPion (WELLBUTRIN SR) 150 MG 12 hr tablet Take 150 mg by mouth See admin instructions. Take 150 mg by mouth in the morning and increase to 150 mg two times a day after fully weaned off of Effexor XR (Venlafaxine)   Yes [provider]  hydrochlorothiazide (HYDRODIURIL) 25 MG tablet Take 25 mg by mouth daily.   Yes [provider]  venlafaxine XR (EFFEXOR-XR) 37.5 MG 24 hr capsule Take 37.5 mg by mouth See admin instructions. Take 37.5 mg by mouth in the morning and an additional 37.5 mg daily as needed for withdrawal symptoms from this medication   Yes [provider]  amphetamine-dextroamphetamine (ADDERALL) 15 MG tablet Take by mouth 2 (two) times daily.  07/14/16   [provider]  amphetamine-dextroamphetamine (ADDERALL) 20 MG tablet Take one tab po every AM at 6am and one tab po at 10am and one half tab po at 1pm 02/25/19   [provider]  FLUoxetine (PROZAC) 20 MG capsule Take by mouth. 05/16/16   [provider]  hydrochlorothiazide (HYDRODIURIL) 25 MG tablet Take by mouth. 01/28/19   [provider]  HYDROcodone-acetaminophen (NORCO) 5-325 MG tablet Take 1 tablet by mouth every 6 (six) hours as needed for severe pain. 08/01/17   Street, [place], PA-C  naproxen (NAPROSYN) 500 MG tablet Take 1 tablet (500 mg total) by mouth 2 (two) times daily as needed for mild pain, moderate pain or headache (TAKE WITH MEALS.). 08/01/17   Street, [place], PA-C  potassium chloride SA (KLOR-CON) 20 MEQ tablet Take 1 tablet (20 mEq total) by mouth 2 (two) times daily for 4 days. 03/10/19 03/14/19  Gailen Shelter, PA  sertraline (ZOLOFT) 25 MG tablet Take 25 mg by mouth daily. 02/13/19   [provider]  zolpidem (AMBIEN) 10 MG tablet Take by mouth. 05/05/16   [provider]    Allergies    Patient has no known allergies.  Review of Systems   Review of Systems  Constitutional: Positive for fatigue. Negative for chills and fever.  HENT: Negative for congestion.   Eyes: Negative for visual disturbance.  Respiratory: Negative for chest tightness and shortness of breath.   Cardiovascular: Negative for chest pain.  Gastrointestinal: Negative for abdominal pain, nausea and vomiting.  Genitourinary: Negative for dysuria.  Musculoskeletal: Negative for back pain  and neck pain.  Skin: Negative for color change and rash.  Neurological: Positive for tremors, syncope, speech difficulty, weakness and numbness. Negative for seizures (Possible?  Unsure).  Psychiatric/Behavioral: Negative for confusion.  All other systems reviewed and are negative.   Physical Exam Updated Vital Signs BP 140/77 (BP Location: Right Arm)   Pulse 91   Temp 98.4 F (36.9 C) (Oral)   Resp 15   SpO2 98%   Physical Exam Vitals and nursing note reviewed.  Constitutional:      General: She is not in acute distress.    Appearance: She is well-developed and well-nourished. She is not ill-appearing.  HENT:     Head: Normocephalic and atraumatic.  Eyes:     Conjunctiva/sclera: Conjunctivae normal.  Cardiovascular:     Rate and Rhythm: Normal rate and regular rhythm.     Heart sounds: No murmur heard.  Pulmonary:     Effort: Pulmonary effort is normal. No respiratory distress.     Breath sounds: Normal breath sounds.  Abdominal:     Palpations: Abdomen is soft.     Tenderness: There is no abdominal tenderness.  Musculoskeletal:        General: No edema.     Cervical back: Neck supple.     Right lower leg: No edema.     Left lower leg: No edema.  Skin:    General: Skin is warm and dry.  Neurological:     Mental Status: She is alert.     Comments: Mental Status:  Alert, oriented, thought content appropriate, able to give a coherent history. Speech fluent without evidence of aphasia. Able to follow 2 step commands without difficulty.  Cranial Nerves:  II:  Peripheral visual fields grossly normal, pupils equal, round, reactive to light III,IV, VI: Questionable slight left eye eyelid droop, extra-ocular motions intact bilaterally  V,VII: smile symmetric, facial light touch sensation equal VIII: hearing grossly normal to voice  X: uvula elevates symmetrically  XI: bilateral shoulder shrug symmetric and strong XII: midline tongue extension without  fassiculations Motor:  Normal tone. 5/5 in upper extremities bilaterally including strong and equal grip strength. RLE with 4/5 strength through ankle dorsiflexion, plantarflexion, knee flexion extension and hip flexion/extension.  Left lower extremity with 5/5 strength.   Cerebellar: normal finger-to-nose with bilateral upper extremities Gait: Slight limp with gait.  CV: distal pulses palpable throughout  Subjective decreased sensation to light touch to RLE worse over foot, lower leg, and outer thigh.    Psychiatric:        Mood and Affect: Mood and affect and mood normal.        Behavior: Behavior normal.     ED Results / Procedures / Treatments   Labs (all labs ordered are listed, but only abnormal results are displayed) Labs Reviewed  CBC - Abnormal; Notable for the following components:      Result Value   WBC 12.0 (*)    All other components within normal limits  DIFFERENTIAL - Abnormal; Notable for the following components:   Neutro Abs 8.9 (*)    All other components within normal limits  COMPREHENSIVE METABOLIC PANEL - Abnormal; Notable for the following components:   Chloride 97 (*)    Glucose, Bld 106 (*)    Calcium 8.7 (*)    Total Protein 6.2 (*)    AST 49 (*)    ALT 45 (*)    All other components within normal limits  URINALYSIS, ROUTINE W REFLEX MICROSCOPIC - Abnormal; Notable for the following components:   APPearance HAZY (*)    All other components within normal limits  RAPID URINE DRUG SCREEN, HOSP PERFORMED - Abnormal; Notable for the following components:   Cocaine POSITIVE (*)    Benzodiazepines POSITIVE (*)    All other components within normal limits  CK - Abnormal; Notable for the following components:   Total CK 1,897 (*)    All other components within normal limits  CBG MONITORING, ED - Abnormal; Notable for the following components:   Glucose-Capillary 101 (*)    All other components within normal limits  PROTIME-INR  APTT  ETHANOL  AMMONIA   I-STAT BETA HCG BLOOD, ED ([facility], [facility], [facility] ONLY)    EKG EKG Interpretation  Date/Time:  Wednesday April 15 2020 12:17:57 EST Ventricular Rate:  95 PR Interval:  126 QRS Duration: 76 QT Interval:  362 QTC Calculation:  454 R Axis:   28 Text Interpretation: Normal sinus rhythm Cannot rule out Anterior infarct , age undetermined Abnormal ECG No significant change since prior 11/20 Confirmed by Meridee Score [phone]) on 04/15/2020 5:00:15 PM   Radiology CT HEAD WO CONTRAST  Result Date: 04/15/2020 CLINICAL DATA:  Neuro deficit, acute, stroke suspected EXAM: CT HEAD WITHOUT CONTRAST TECHNIQUE: Contiguous axial images were obtained from the base of the skull through the vertex without intravenous contrast. COMPARISON:  07/21/2003. FINDINGS: Brain: No acute infarct or intracranial hemorrhage. No mass lesion. No midline shift, ventriculomegaly or extra-axial fluid collection. Vascular: No hyperdense vessel or unexpected calcification. Skull: Negative for fracture or focal lesion. Sinuses/Orbits: Normal orbits. Minimal ethmoid sinus mucosal thickening. Trace right mastoid effusion. Other: None. IMPRESSION: No acute intracranial process. Electronically Signed   By: Stana Bunting M.D.   On: 04/15/2020 12:48    Procedures Procedures (including critical care time)  Medications Ordered in ED Medications  nicotine (NICODERM CQ - dosed in mg/24 hours) patch 21 mg (21 mg Transdermal Patch Applied 04/15/20 2026)  lactated ringers bolus 500 mL (500 mLs Intravenous New Bag/Given 04/15/20 1902)    ED Course  I have reviewed the triage vital signs and the nursing notes.  Pertinent labs & imaging results that were available during my care of the patient were reviewed by me and considered in my medical decision making (see chart for details).  Clinical Course as of 04/15/20 2306  Wed Apr 15, 2020  1748 I spoke with neurology who will see patient for evaluation. [EH]  4219 44 year old female  with recent psychiatric medication change here after being found on the floor by her husband, no known cause.  She is complaining of pain through her low back and right buttock extending down her leg with some numbness and weakness.  Strength is 4 out of 5 and subjective decreased sensation and proprioception.  Reflexes preserved.  Getting labs head CT and neurologic consultation. [MB]  2017 Patient was noted by staff to be walking out the ambulance bay to "go have a smoke."  I saw her walking with a limp.  She was directed back in.  Nicotine patch ordered.  [EH]  2053 Reevaluated patient, I walked in the room she has 2 visitors in the room, they appear to have brought her food as she is currently eating a slim jim in no obvious distress.  Informed her that we are currently awaiting neurology. [EH]  2124 COCAINE(!): POSITIVE [EH]    Clinical Course User Index [EH] Cristina Gong, PA-C [MB] Terrilee Files, MD   MDM Rules/Calculators/A&P                          Alice Martinez is a 44 year old woman who presents today for evaluation of tremors, suspected syncopal event resulting on sleeping on the floor for an extended period of time overnight in the setting of multiple medication changes.  Slight leukocytosis.  CMP shows minimal transaminitis, calcium is slightly low.  PT PT/INR are negative, pregnancy test is negative.  UA is unremarkable.  Her CK is minimally elevated at 1800.  Ammonia is normal.  UDS is positive for benzodiazepines, which patient is prescribed, in addition to cocaine.  She did have multiple medication changes recently including starting Wellbutrin.  Unclear if patient had seizure.  She does have right leg weakness with paraesthesias in the right leg also.    Neurology is consulted.  Patient  is pending neuro consult.   At shift change care was transferred to Sharilyn Sites PA-C  who will follow pending studies, re-evaulate and determine disposition.     Final  Clinical Impression(s) / ED Diagnoses Final diagnoses:  Weakness of right leg  Paresthesia  Cocaine use  Tobacco abuse    Rx / DC Orders ED Discharge Orders    None       Cristina Gong, PA-C 04/16/20 0009    Terrilee Files, MD 04/16/20 1150

## 2020-04-15 NOTE — ED Triage Notes (Signed)
Pt from home with ems for c.o right leg numbness since 630 this morning. LSN 1030 last night. Pts husband found her lying in the floor at 630 this morning, pt unsure why she was there. Pt also having pain in her right lower back. No weakness or other neuro deficits noted. Pt recently taken off her adderall and effexor and started of welbutrin. Pt a.o, nad noted.

## 2020-04-15 NOTE — ED Notes (Signed)
This NT saw pt and her daughter walking out of EMS bay and followed them, unsure why pt was leaving through EMS bay. Approached pt and daughter and asked what they were doing and where they were going and pt stated "to be completely honest I was going to her car to smoke". Pt was carrying fluid bag in her hands and had all cords attached. Pt asked this NT if I wanted to disconnect her IV and hold her fluid bag until she got back to her room. Pt asked if she could just come back through ED entrance when she came back. This NT informed pt that we are a tobacco free campus and facility and advised pt to come back inside to her room. Pt agreed and came back to room. Pt asked "what do you do for pt's who smoke?"   RN, PA, and MD notified.

## 2020-04-16 ENCOUNTER — Observation Stay (HOSPITAL_COMMUNITY): Payer: Self-pay

## 2020-04-16 ENCOUNTER — Encounter (HOSPITAL_COMMUNITY): Payer: Self-pay | Admitting: Internal Medicine

## 2020-04-16 DIAGNOSIS — T796XXA Traumatic ischemia of muscle, initial encounter: Secondary | ICD-10-CM

## 2020-04-16 DIAGNOSIS — F1410 Cocaine abuse, uncomplicated: Secondary | ICD-10-CM

## 2020-04-16 DIAGNOSIS — M6282 Rhabdomyolysis: Secondary | ICD-10-CM | POA: Diagnosis present

## 2020-04-16 DIAGNOSIS — R29898 Other symptoms and signs involving the musculoskeletal system: Secondary | ICD-10-CM | POA: Diagnosis present

## 2020-04-16 DIAGNOSIS — R55 Syncope and collapse: Secondary | ICD-10-CM | POA: Diagnosis present

## 2020-04-16 HISTORY — DX: Cocaine abuse, uncomplicated: F14.10

## 2020-04-16 LAB — CBC
HCT: 37.3 % (ref 36.0–46.0)
Hemoglobin: 12.3 g/dL (ref 12.0–15.0)
MCH: 29.9 pg (ref 26.0–34.0)
MCHC: 33 g/dL (ref 30.0–36.0)
MCV: 90.5 fL (ref 80.0–100.0)
Platelets: 320 10*3/uL (ref 150–400)
RBC: 4.12 MIL/uL (ref 3.87–5.11)
RDW: 13.4 % (ref 11.5–15.5)
WBC: 7.1 10*3/uL (ref 4.0–10.5)
nRBC: 0 % (ref 0.0–0.2)

## 2020-04-16 LAB — RESP PANEL BY RT-PCR (FLU A&B, COVID) ARPGX2
Influenza A by PCR: NEGATIVE
Influenza B by PCR: NEGATIVE
SARS Coronavirus 2 by RT PCR: NEGATIVE

## 2020-04-16 LAB — CK: Total CK: 968 U/L — ABNORMAL HIGH (ref 38–234)

## 2020-04-16 LAB — TROPONIN I (HIGH SENSITIVITY)
Troponin I (High Sensitivity): 3 ng/L (ref <18)
Troponin I (High Sensitivity): 3 ng/L (ref <18)

## 2020-04-16 LAB — CREATININE, SERUM
Creatinine, Ser: 0.74 mg/dL (ref 0.44–1.00)
GFR, Estimated: >60 mL/min (ref >60)

## 2020-04-16 LAB — HIV ANTIBODY (ROUTINE TESTING W REFLEX): HIV Screen 4th Generation wRfx: NONREACTIVE

## 2020-04-16 IMAGING — MR MR HEAD W/O CM
12 of 13 series · 44 of 48 positions shown · non-contrast
Comparison: Head CT [DATE].

CLINICAL DATA: 44-year-old female with acute neurologic deficits.
Abnormal speech, right leg weakness.

EXAM:
MRI HEAD WITHOUT CONTRAST
TECHNIQUE: Multiplanar, multiecho pulse sequences of the brain and surrounding
structures were obtained without intravenous contrast.

[Series 5: DWI · axial · 3.0mm · 0.88mm/px · z∈[-157,-14]mm · 7 of 100 slices shown (1 of 4)]
[im 1/100]
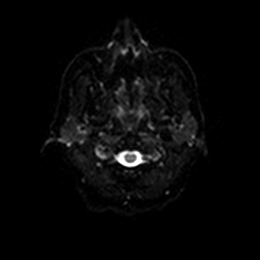
[im 17/100]
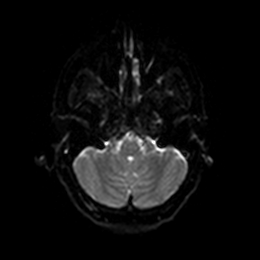
[im 34/100]
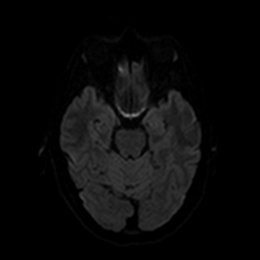
[im 50/100]
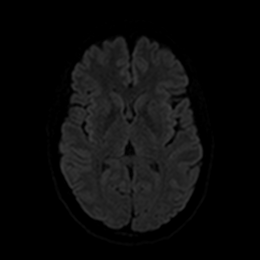
[im 67/100]
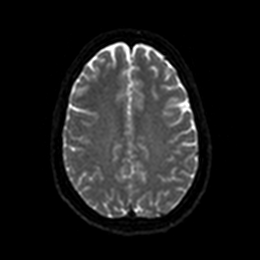
[im 83/100]
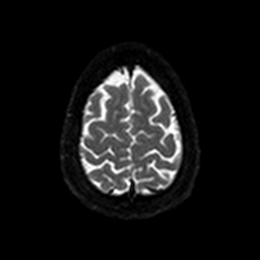
[im 100/100]
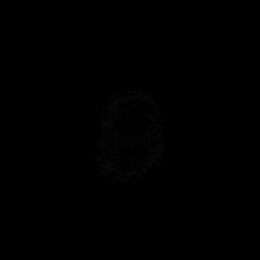

[Series 6: DWI · axial · 3.0mm · 0.88mm/px · z∈[-157,-14]mm · 4 of 50 slices shown (2 of 4)]
[im 1/50]
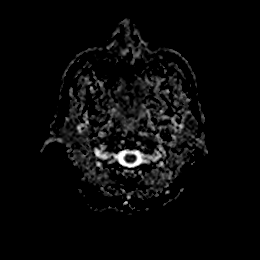
[im 17/50]
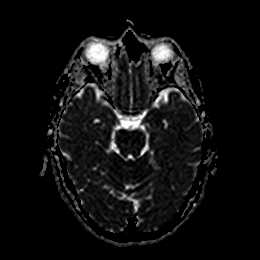
[im 33/50]
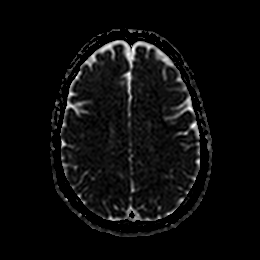
[im 50/50]
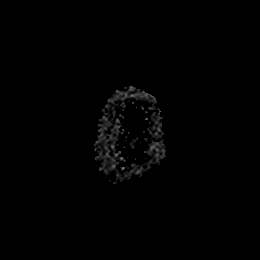

[Series 7: DWI · coronal · 4.0mm · 0.88mm/px · 6 of 72 slices shown (3 of 4)]
[im 1/72]
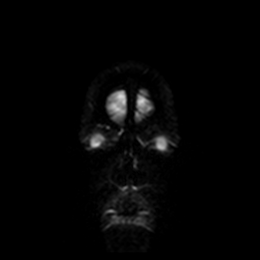
[im 15/72]
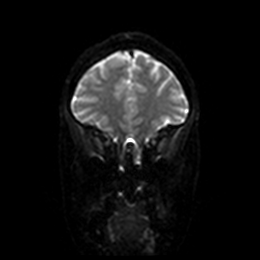
[im 29/72]
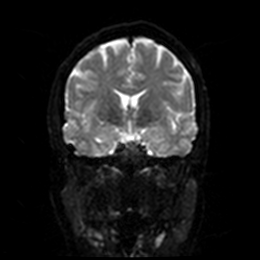
[im 43/72]
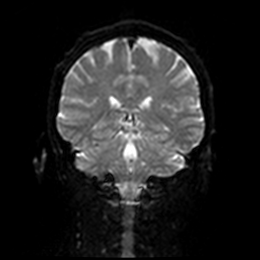
[im 57/72]
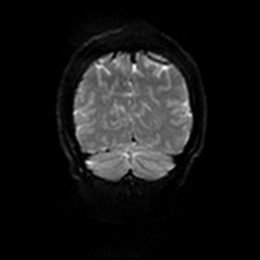
[im 72/72]
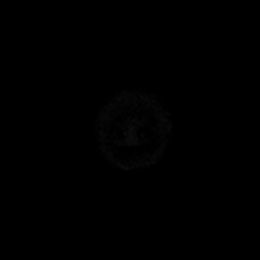

[Series 8: DWI · coronal · 4.0mm · 0.88mm/px · 3 of 36 slices shown (4 of 4)]
[im 1/36]
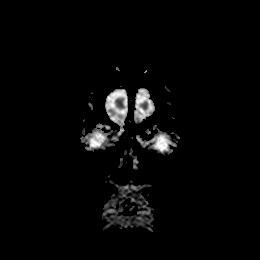
[im 18/36]
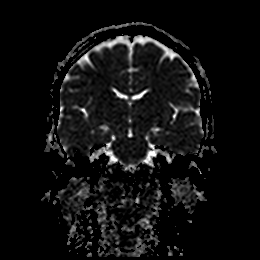
[im 36/36]
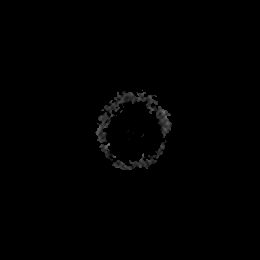

[Series 9: T1 · sagittal · 5.0mm · 0.75mm/px · 2 of 23 slices shown]
[im 1/23]
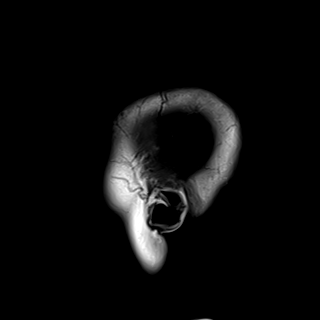
[im 23/23]
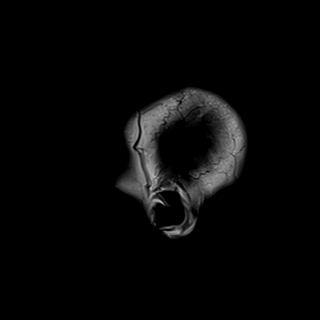

[Series 10: T2 · axial · 5.0mm · 0.72mm/px · z∈[-156,-16]mm · 2 of 25 slices shown (1 of 2)]
[im 1/25]
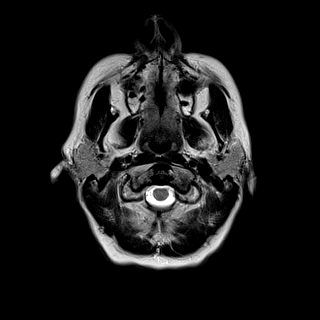
[im 25/25]
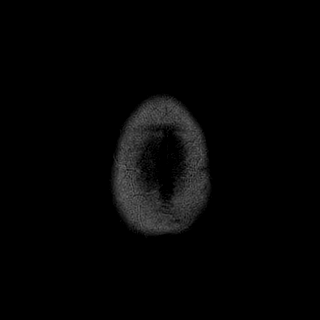

[Series 11: FLAIR · axial · 5.0mm · 0.45mm/px · z∈[-156,-16]mm · 2 of 25 slices shown]
[im 1/25]
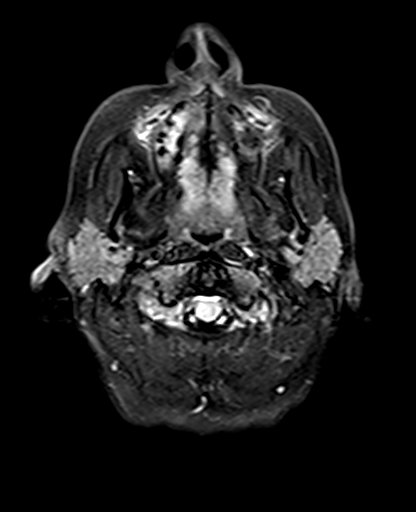
[im 25/25]
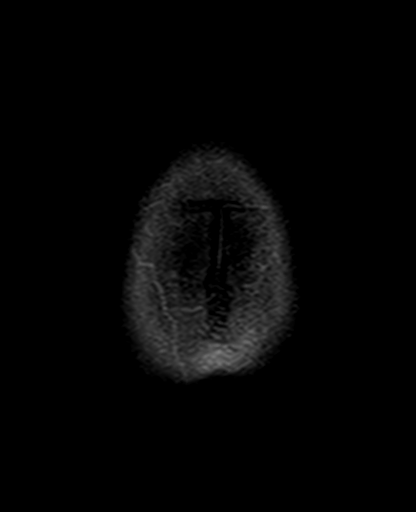

[Series 12: mag_images · axial · 3.0mm · 0.90mm/px · z∈[-160,-11]mm · 4 of 52 slices shown]
[im 1/52]
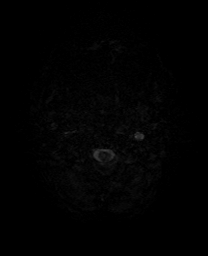
[im 18/52]
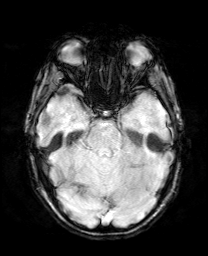
[im 35/52]
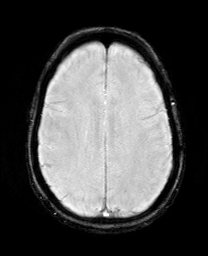
[im 52/52]
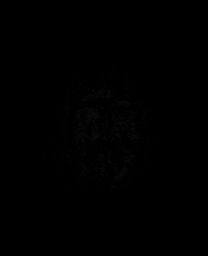

[Series 13: pha_images · axial · 3.0mm · 0.90mm/px · z∈[-157,-17]mm · 4 of 49 slices shown]
[im 1/49]
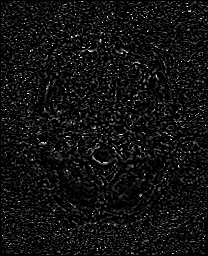
[im 17/49]
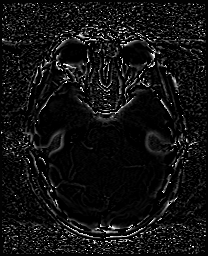
[im 33/49]
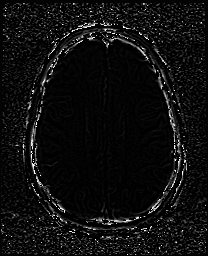
[im 49/49]
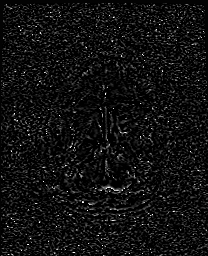

[Series 14: swi_images · axial · 3.0mm · 0.90mm/px · z∈[-160,-11]mm · 4 of 52 slices shown]
[im 1/52]
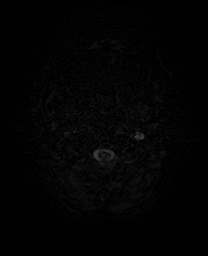
[im 18/52]
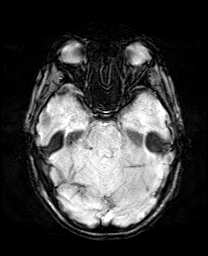
[im 35/52]
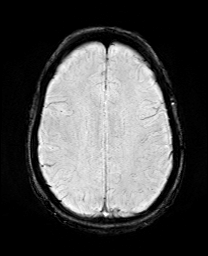
[im 52/52]
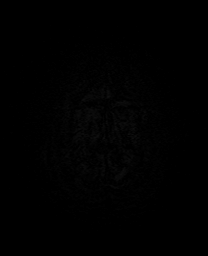

[Series 15: mip_images(sw) · axial · 24.0mm · 0.90mm/px · z∈[-150,-22]mm · 4 of 45 slices shown]
[im 1/45]
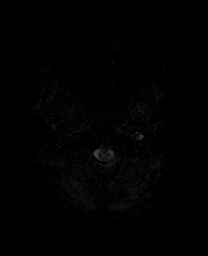
[im 15/45]
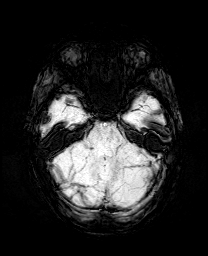
[im 30/45]
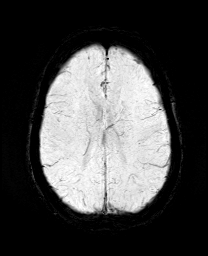
[im 45/45]
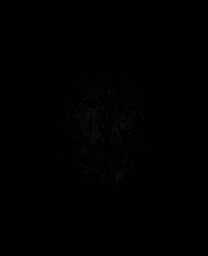

[Series 17: T2 · coronal · 5.0mm · 0.34mm/px · 2 of 29 slices shown (2 of 2)]
[im 1/29]
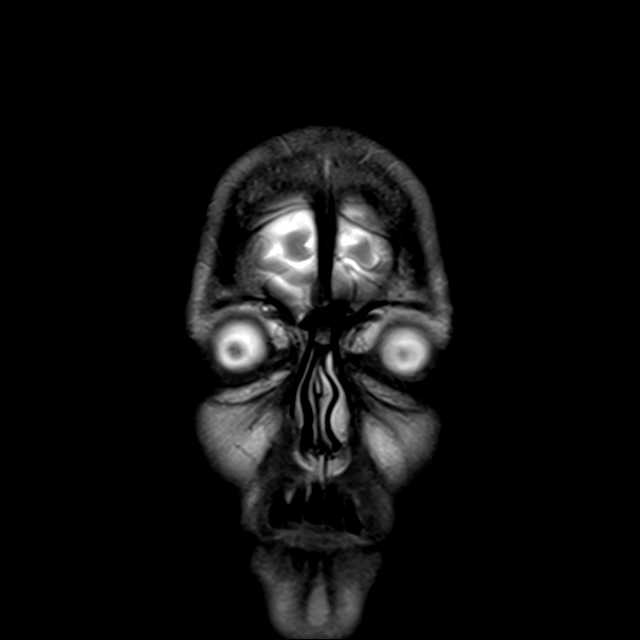
[im 29/29]
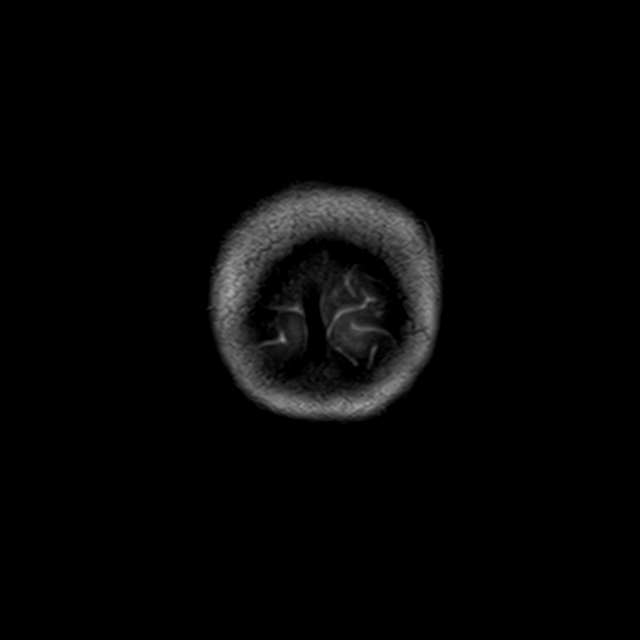

[44 of 48 positions shown; findings below may reference images not displayed]

FINDINGS: Brain: No restricted diffusion to suggest acute infarction. No
midline shift, mass effect, evidence of mass lesion,
ventriculomegaly, extra-axial collection or acute intracranial
hemorrhage. Cervicomedullary junction and pituitary are within
normal limits.

Normal cerebral volume.

Gray and white matter signal is within normal limits throughout the
brain. No encephalomalacia or chronic cerebral blood products
identified. Deep gray nuclei, brainstem and cerebellum appear
normal.

Vascular: Major intracranial vascular flow voids are preserved.

Skull and upper cervical spine: Negative.

Sinuses/Orbits: Orbits appear symmetric and negative. Paranasal
sinuses and mastoids are stable and well pneumatized.

Other: Visible scalp and face appear negative.
IMPRESSION: Normal noncontrast MRI appearance of the brain.

## 2020-04-16 IMAGING — CR DG PELVIS 1-2V
1 series · 1 of 1 positions shown · non-contrast
Comparison: None.

CLINICAL DATA: Status post fall.  Posterior right hip pain.

EXAM:
PELVIS - 1-2 VIEW

[pelvis ap]
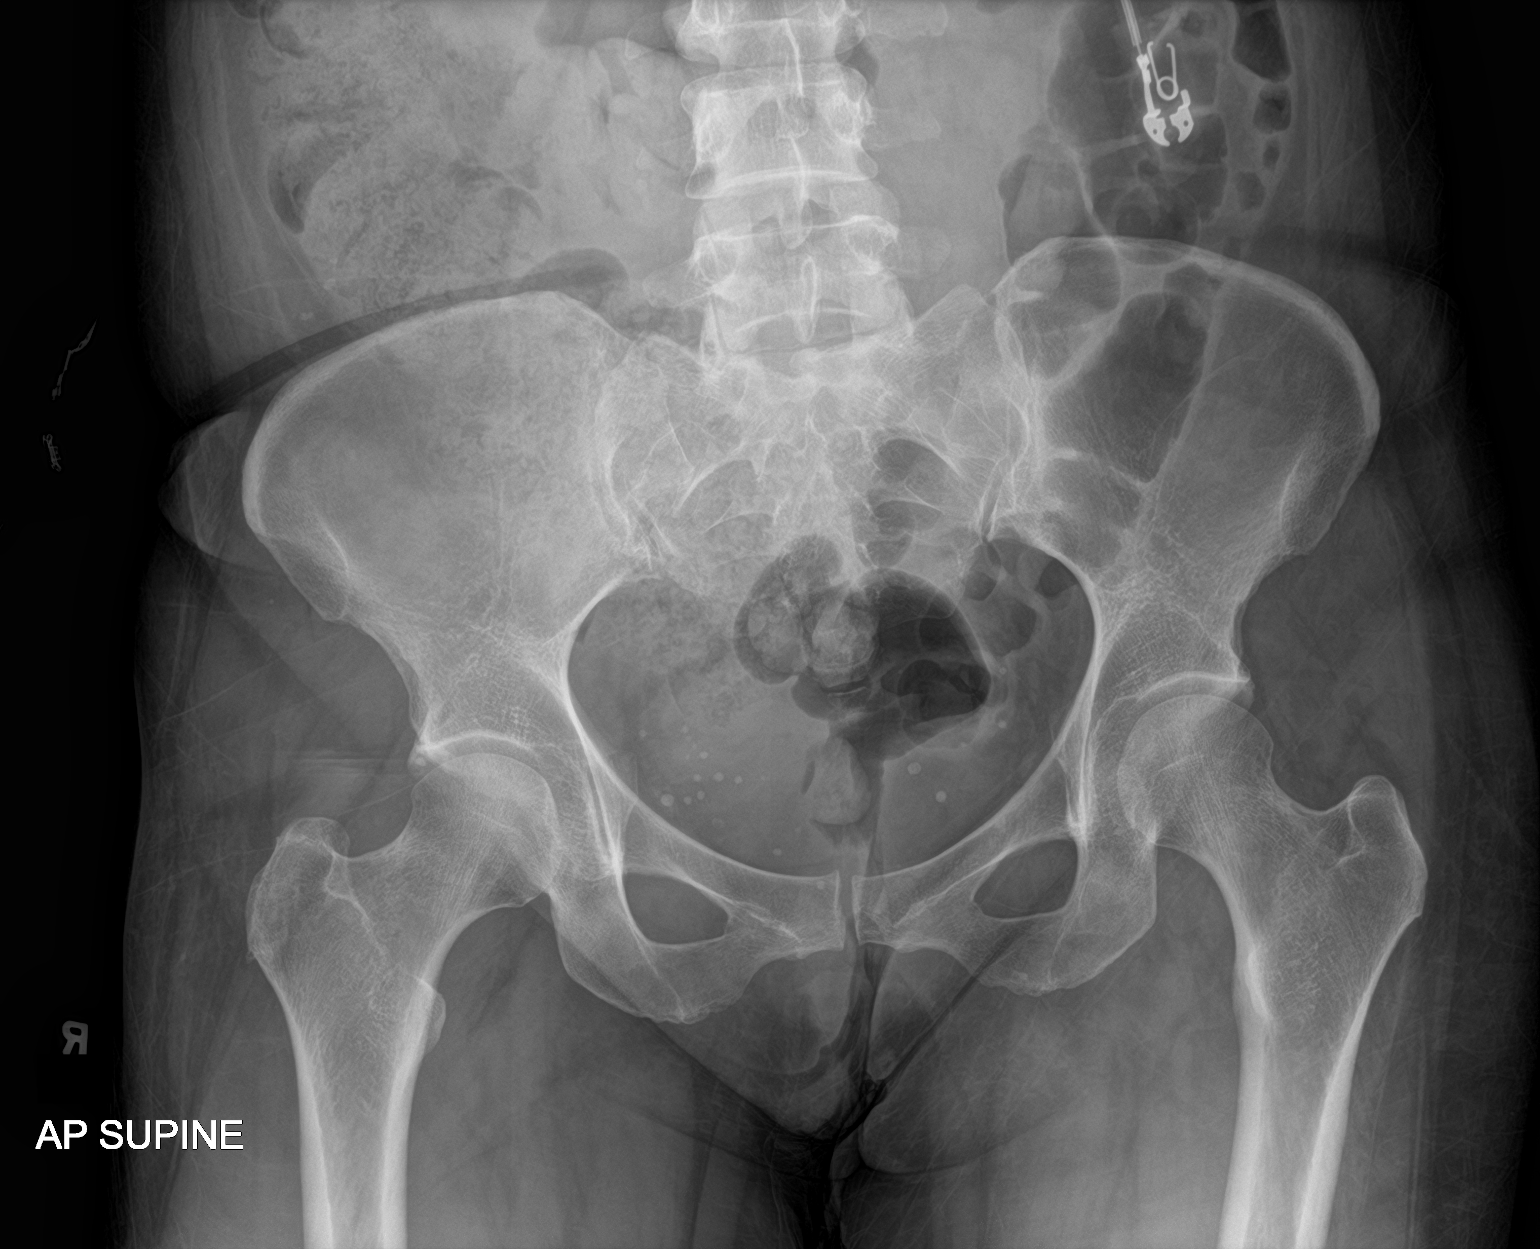

[1 of 1 positions shown; findings below may reference images not displayed]

FINDINGS: There is no evidence of pelvic fracture or diastasis. No pelvic bone
lesions are seen.
IMPRESSION: Negative.

## 2020-04-16 IMAGING — MR MR LUMBAR SPINE W/O CM
4 of 5 series · 26 of 48 positions shown · non-contrast
Comparison: Brain MRI today.

CLINICAL DATA: 44-year-old female with acute neurologic deficits.
Abnormal speech, right leg weakness.

EXAM:
MRI LUMBAR SPINE WITHOUT CONTRAST
TECHNIQUE: Multiplanar, multisequence MR imaging of the lumbar spine was
performed. No intravenous contrast was administered.

[Series 17: T2 · sagittal · 4.0mm · 0.73mm/px · 6 of 16 slices shown (1 of 2)]
[im 1/16]
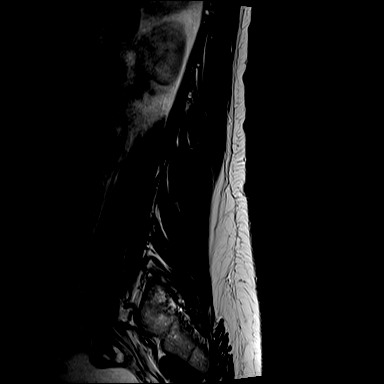
[im 4/16]
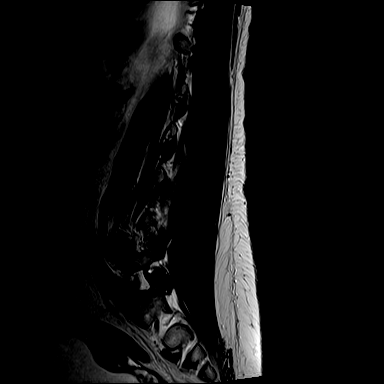
[im 7/16]
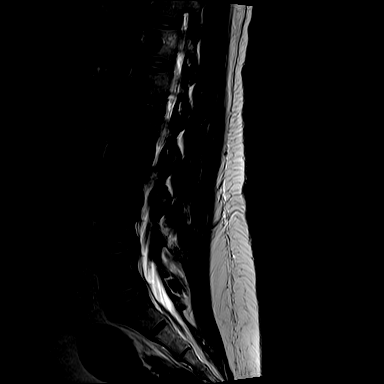
[im 10/16]
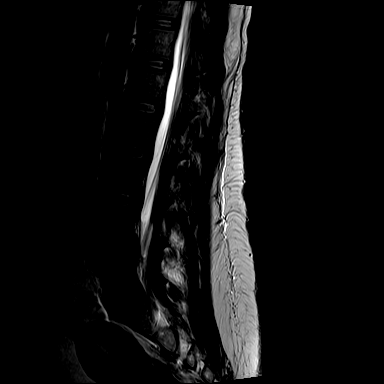
[im 13/16]
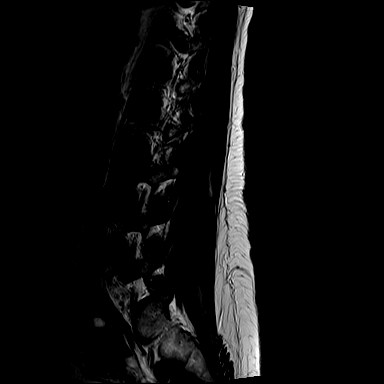
[im 16/16]
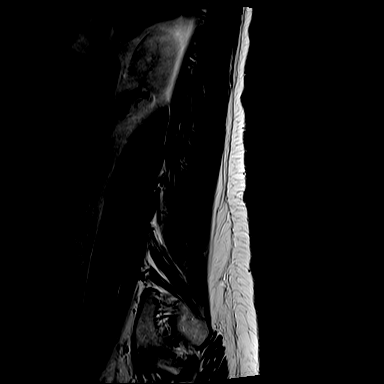

[Series 19: T1 · sagittal · 4.0mm · 0.88mm/px · 6 of 16 slices shown (1 of 2)]
[im 1/16]
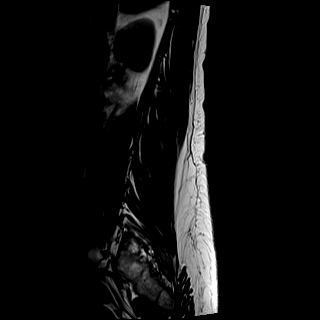
[im 4/16]
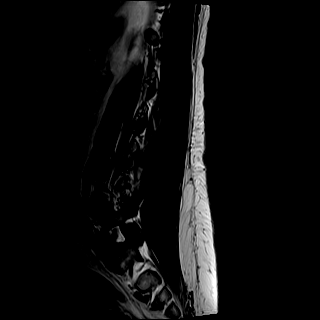
[im 7/16]
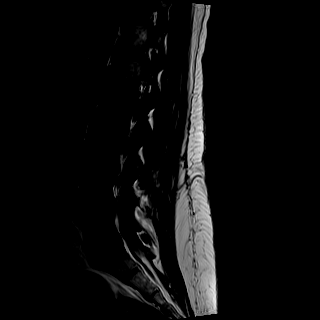
[im 10/16]
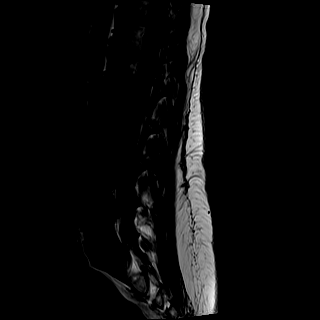
[im 13/16]
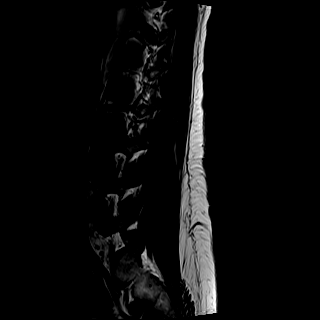
[im 16/16]
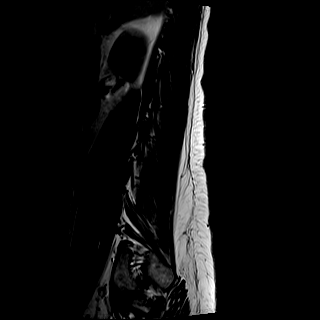

[Series 20: T2 · axial · 4.0mm · 0.57mm/px · z∈[-681,-469]mm · 9 of 38 slices shown (2 of 2)]
[im 1/38]
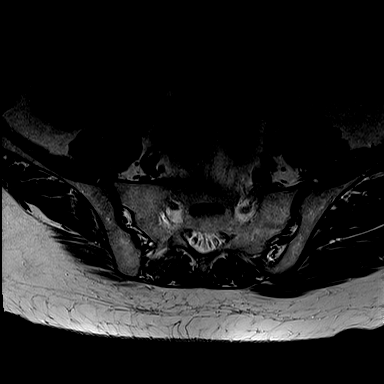
[im 6/38]
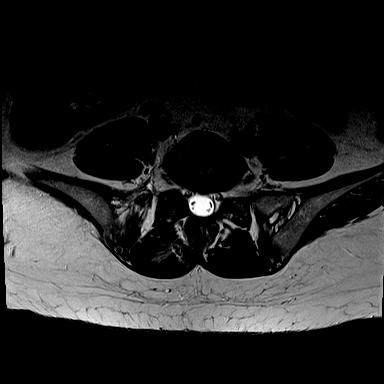
[im 11/38]
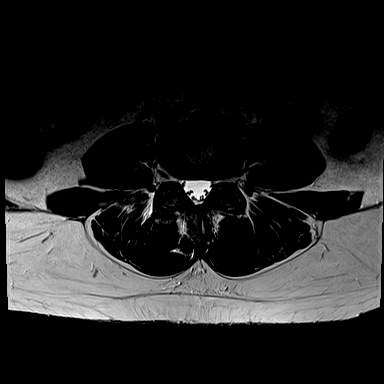
[im 16/38]
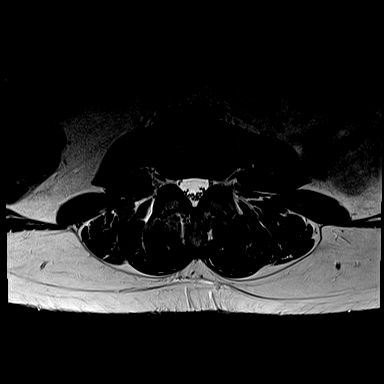
[im 19/38]
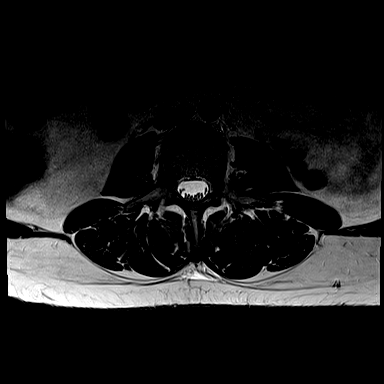
[im 22/38]
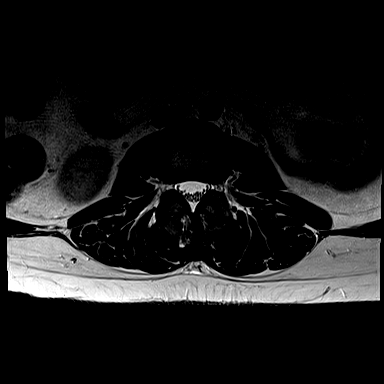
[im 27/38]
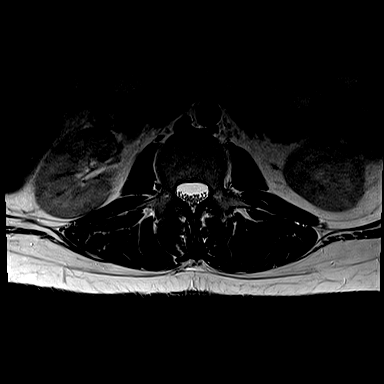
[im 32/38]
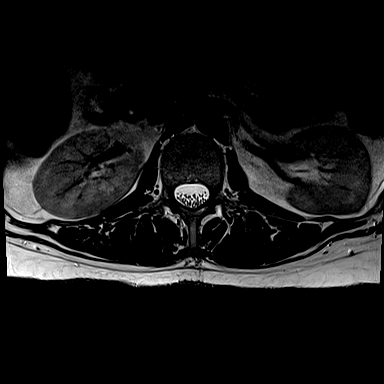
[im 38/38]
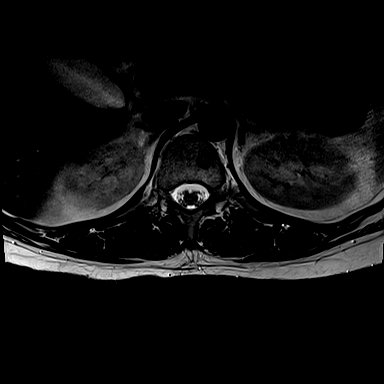

[Series 21: T1 · axial · 4.0mm · 0.34mm/px · z∈[-681,-498]mm · 5 of 38 slices shown (2 of 2)]
[im 1/38]
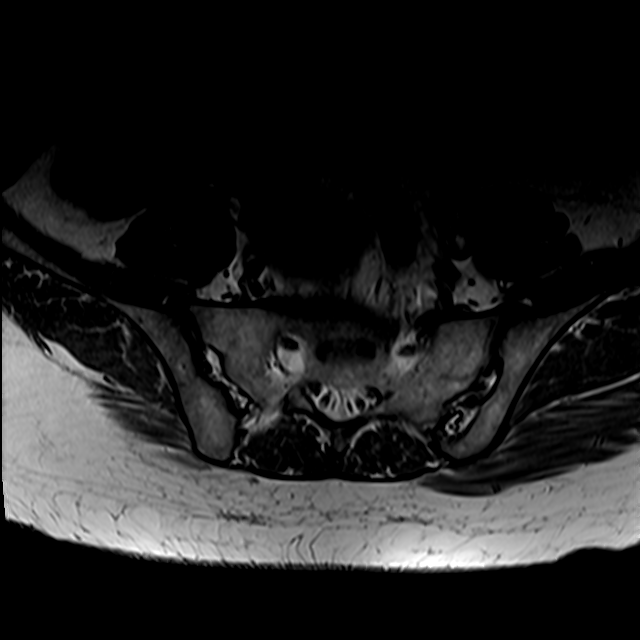
[im 6/38]
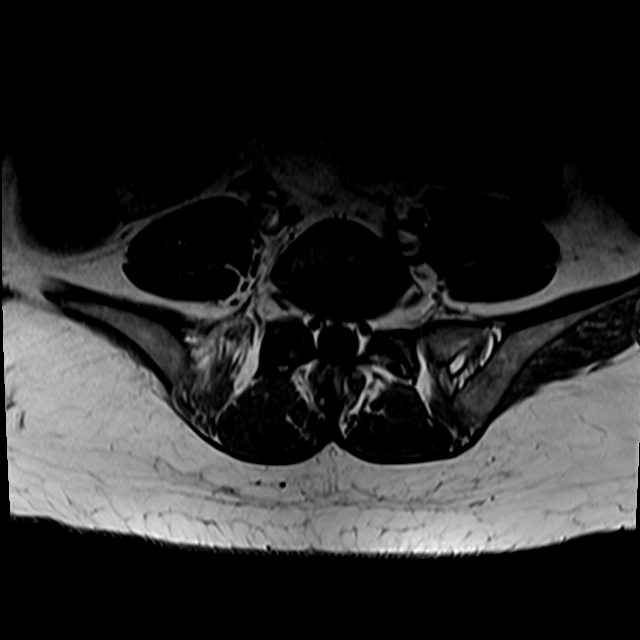
[im 11/38]
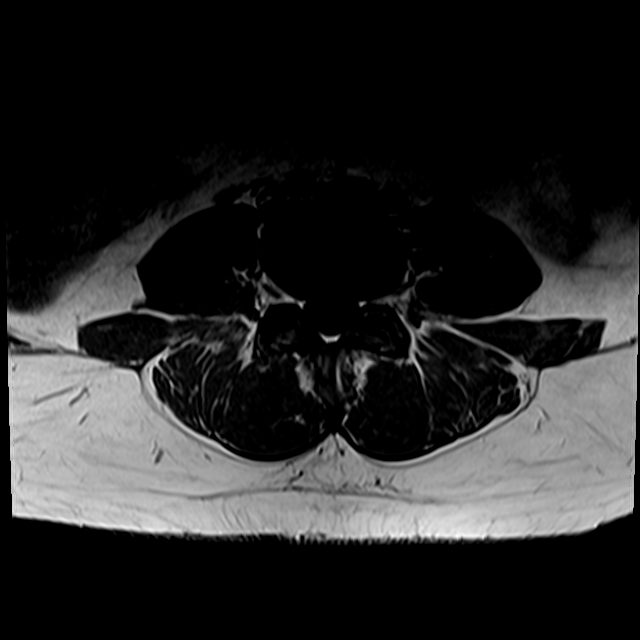
[im 19/38]
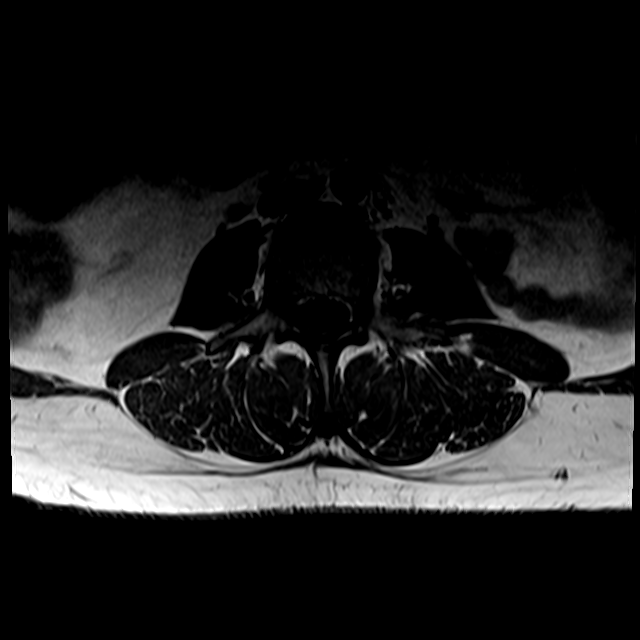
[im 32/38]
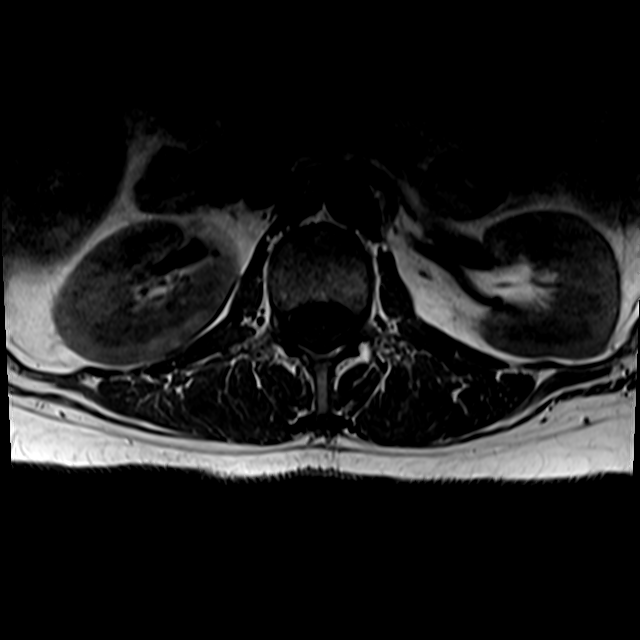

[26 of 48 positions shown; findings below may reference images not displayed]

FINDINGS: Segmentation: Lumbar segmentation appears to be normal and will be
designated as such for this report.

Alignment:  Normal lumbar lordosis.  No spondylolisthesis.

Vertebrae: No marrow edema or evidence of acute osseous abnormality.
Visualized bone marrow signal is within normal limits. Intact
visible sacrum and SI joints.

Conus medullaris and cauda equina: Conus extends to the T12-L1
level. No lower spinal cord or conus signal abnormality.

Paraspinal and other soft tissues: Negative visible abdominal
viscera.

However, there is Patchy and confluent medial right erector spinae
muscle abnormal T2 and STIR hyperintensity (series 18, image 6)
tracking to the sacrum and in keeping with myositis. Trace similar
contralateral left medial paraspinal muscle signal abnormality at
the L3 level (image 11). No intramuscular fluid collections. The
psoas musculature appears normal. No other paraspinal soft tissue
inflammation.

Disc levels:

Normal intervertebral disc signal and morphology from the visible
lower thoracic spine through L5-S1. There is intermittent lumbar
facet hypertrophy, which is up to moderate at the L4-L5 and L5-S1
levels and greater on the right. But there is no associated spinal
or lateral recess stenosis. No convincing foraminal stenosis.
IMPRESSION: 1. Positive for right side lumbosacral Acute Myositis. Trace
contralateral left myositis at the L3 level.
This is nonspecific with regard to etiology. There is no acute
osseous abnormality, no intramuscular fluid collection, and the
other paraspinal soft tissues remain normal.

2. No lumbar disc degeneration, spinal stenosis or neural
impingement. Up to moderate lower lumbar facet hypertrophy.

## 2020-04-16 MED ORDER — ACETAMINOPHEN 160 MG/5ML PO SOLN: 650.0000 mg | ORAL | Status: DC | PRN

## 2020-04-16 MED ORDER — VENLAFAXINE HCL ER 37.5 MG PO CP24
37.5000 mg | ORAL_CAPSULE | Freq: Every day | ORAL | Status: DC
  Administered 2020-04-16: 37.5 mg via ORAL
  Filled 2020-04-16: qty 1

## 2020-04-16 MED ORDER — STROKE: EARLY STAGES OF RECOVERY BOOK
Freq: Once | Status: AC
  Filled 2020-04-16: qty 1

## 2020-04-16 MED ORDER — ACETAMINOPHEN 325 MG PO TABS: 650.0000 mg | ORAL_TABLET | ORAL | Status: DC | PRN

## 2020-04-16 MED ORDER — VENLAFAXINE HCL ER 37.5 MG PO CP24: 37.5000 mg | ORAL_CAPSULE | Freq: Every day | ORAL | Status: DC | PRN

## 2020-04-16 MED ORDER — ENOXAPARIN SODIUM 40 MG/0.4ML ~~LOC~~ SOLN: 40.0000 mg | Freq: Every day | SUBCUTANEOUS | Status: DC

## 2020-04-16 MED ORDER — ACETAMINOPHEN 650 MG RE SUPP: 650.0000 mg | RECTAL | Status: DC | PRN

## 2020-04-16 MED ORDER — SODIUM CHLORIDE 0.9 % IV SOLN: INTRAVENOUS | Status: DC

## 2020-04-16 MED ORDER — BUPROPION HCL ER (SR) 150 MG PO TB12
150.0000 mg | ORAL_TABLET | Freq: Every day | ORAL | Status: DC
  Administered 2020-04-16: 150 mg via ORAL
  Filled 2020-04-16: qty 1

## 2020-04-16 MED ORDER — HYDRALAZINE HCL 20 MG/ML IJ SOLN: 10.0000 mg | INTRAMUSCULAR | Status: DC | PRN

## 2020-04-16 NOTE — ED Provider Notes (Signed)
  2:23 AM Neurology has evaluated with some inconsistent findings on exam.  there is concern for possible new seizure vs syncope as she was amnestic to fall at home.  Recommends MRI brain and LS, syncope work-up, along with EEG.  She will require admission to have full work-up completed.  Discussed with Dr. Toniann Fail-- will admit for ongoing care.   Garlon Hatchet, PA-C 04/16/20 0303    Zadie Rhine, MD 04/16/20 [phone]

## 2020-04-16 NOTE — Progress Notes (Signed)
PROGRESS NOTE  Alice Martinez  DOB: [birth year]  PCP: Lindley Magnus, PA-C ERD:[id number]  DOA: 04/15/2020  LOS: 0 days   Chief Complaint  Patient presents with  . Numbness    Brief narrative: Alice Martinez is a 44 y.o. female with PMH significant for hypertension, depression, hyperlipidemia, ADHD. Patient was brought to the ED on 12/15 for right lower extremity pain and weakness after being in the floor for 4 to 5 hours following syncopal episode.  Over the last few days, patient's husband noticed some abnormal behavior during sleep.  She was talking out of her mind and had abnormal jerking movements. Patient admits to taking cocaine 2 days prior to presentation.  Patient also recently had her antidepressant and ADHD medicines changed by PCP. PRN Adderall was discontinued.  Effexor dose was decreased and Wellbutrin was introduced.   On the morning of 12/15, around 1 AM, patient went to the kitchen to have something and passed out.  At around 6 AM, patient's husband found her unconscious on the floor on 6 AM.  She woke up for him and was found to have right lower back pain and right lower extremity pain and weakness.   EMS brought her to ED.    In the ED, patient was hemodynamically stable.  She was noted to have significant limitation in movement of right lower extremity because of pain. EKG showed normal sinus rhythm with normal QTC.  WBC 12, LFTs mildly elevated.  Ammonia levels were normal.  CK level was elevated to 1800. CT head was unremarkable.  Covid test was negative  urine drug screen was positive for benzodiazepine and cocaine.   On-call neurologist suggested MRI of the brain, lumbar spine, EEG and syncope work-up. On-call neurologist was consulted requested getting MRI of the brain L-spine and EEG.  And syncope work-up. Admitted to hospitalist service.  MRI brain normal. MRI lumbar spine was positive for right side lumbosacral acute myositis. Trace contralateral left  myositis at the L3 level. This is nonspecific with regard to etiology. There is no acute osseous abnormality, no intramuscular fluid collection, and the other paraspinal soft tissues remain normal.  Subjective: Patient was seen and examined this am.  Young caucasian female lying down in bed.  Not in pain at rest.  Complains of pain of right lower extremity on any movement. Daughter at bedside.  Assessment/Plan: Syncope -Unclear etiology.  Per history, patient used cocaine 2 days prior.  UDS positive for benzodiazepine and cocaine.  She also had adjustments made in her antidepressants few days prior.  For few days prior to presenting, patient was having abnormal movements, slurred speech. -Symptoms are probably related to prescription drugs and cocaine.  Less likely to be cardiac or neurologic in origin.  EKG normal.  MRI brain normal. -Neurologist recommended an EEG to rule out seizure. -We will obtain an echocardiogram for completion of work-up. -Continue to monitor on telemetry.  Acute lumbosacral myositis R>L Acute rhabdomyolysis -Complains of pain and weakness of the right lower extremity after being on the ground for several hours following the syncope -CK level elevated. -MRI lumbar spine suggest acute lumbosacral myositis right more than left. -Currently on normal saline at 125 mill per hour.  Continue the same. -Keep HCTZ on hold. Recent Labs  Lab 04/15/20 1743  CKTOTAL 1,897*    Elevated liver enzymes -Probably because of rhabdomyolysis itself. -Continue to monitor.  Acute hepatitis panel was sent for completion of work-up. Recent Labs  Lab 04/15/20 1220  AST  49*  ALT 45*  ALKPHOS 62  BILITOT 0.4  PROT 6.2*  ALBUMIN 3.6   Essential hypertension -HCTZ on hold.  Blood pressure stable.  IV hydralazine ordered.  Hyperlipidemia -Statin currently on hold  History of depression -Currently on Wellbutrin and tapering course of Effexor.  Continue same.  Cocaine abuse   -Counseled to quit.    Tobacco abuse -advised to quit.    Mobility: Pending PT evaluation Code Status:   Code Status: Full Code  Nutritional status: Body mass index is 25.89 kg/m.     Diet Order            Diet regular Room service appropriate? Yes; Fluid consistency: Thin  Diet effective now                 DVT prophylaxis: enoxaparin (LOVENOX) injection 40 mg Start: 04/16/20 1000   Antimicrobials:  None Fluid: Normal saline at 125 mill per hour Consultants: Neurology Family Communication:  Daughter at bedside  Status is: Observation  The patient will require care spanning > 2 midnights and should be moved to inpatient because: Needs continuous IV hydration.  Further syncope work-up  Dispo: The patient is from: Home              Anticipated d/c is to: Home              Anticipated d/c date is: 2 days              Patient currently is not medically stable to d/c.       Infusions:  . sodium chloride 125 mL/hr at 04/16/20 0707    Scheduled Meds: . buPROPion  150 mg Oral Q breakfast  . enoxaparin (LOVENOX) injection  40 mg Subcutaneous Daily  . nicotine  21 mg Transdermal Once  . venlafaxine XR  37.5 mg Oral Q breakfast    Antimicrobials: Anti-infectives (From admission, onward)   None      PRN meds: acetaminophen **OR** acetaminophen (TYLENOL) oral liquid 160 mg/5 mL **OR** acetaminophen, hydrALAZINE, venlafaxine XR   Objective: Vitals:   04/16/20 0847 04/16/20 0930  BP: 120/62 130/63  Pulse: 87 82  Resp: 14 15  Temp: 97.7 F (36.5 C)   SpO2: 99% 97%   No intake or output data in the 24 hours ending 04/16/20 0942 Filed Weights   04/16/20 0847  Weight: 62.1 kg   Weight change:  Body mass index is 25.89 kg/m.   Physical Exam: General exam: Not in physical distress at rest Skin: No rashes, lesions or ulcers. HEENT: Atraumatic, normocephalic, no obvious bleeding Lungs: Clear to auscultation bilaterally CVS: Regular rate and rhythm, no  murmur GI/Abd soft, nontender, nondistended, bowel sound present CNS: Alert, awake, oriented x3 Psychiatry: Mood appropriate Extremities: No pedal edema.  Right calf seems still swollen.  It is tender on dorsiflexion of right foot  Data Review: I have personally reviewed the laboratory data and studies available.  Recent Labs  Lab 04/15/20 1220 04/16/20 0618  WBC 12.0* 7.1  NEUTROABS 8.9*  --   HGB 13.5 12.3  HCT 40.3 37.3  MCV 89.2 90.5  PLT 366 320   Recent Labs  Lab 04/15/20 1220 04/16/20 0618  NA 135  --   K 3.6  --   CL 97*  --   CO2 29  --   GLUCOSE 106*  --   BUN 11  --   CREATININE 0.69 0.74  CALCIUM 8.7*  --     F/u labs  ordered.  Signed, Lorin Glass, MD [facility] 04/16/2020

## 2020-04-16 NOTE — Consult Note (Signed)
NEURO HOSPITALIST CONSULT NOTE   Requestig physician: Dr. Charm Barges  Reason for Consult: Right leg numbess  History obtained from:  Patient and Chart     HPI:                                                                                                                                          Alice Martinez is an 44 y.o. female with a PMHx of HTN, hypercholesterolemia, ADHD and depression who presented from home with new onset of RLE weakness this AM preceded by slowing of speech and shaking episodes overnight. LKN was 10:30 PM on Tuesday night. Her husband had reported first noticing the slowed speech on Monday night, then observed her to have some episodes of shaking overnight. The latter symptom frequently occurs and has been attributed to anxiety per husband. However, the shaking spell overnight was somewhat different which made husband concerned, so he stayed awake to watch her. There was no B/B incontinence or tongue biting, but she did have a hard time fully recovering after one of the episodes, apparently looking at husband and smiling, but not verbally responding.   The leg weakness was noticed after she got up in the AM - she was making Kool-Aid and apparently collapsed to the floor. Her husband found her on the kitchen floor at about 6 AM this morning, having spilled Kool-Aid everywhere. She has been unable to fully bear weight on her RLE after this.   In the ED, she endorsed cramping pain in her lower back and trapezius which she feels are due to withdrawal symptoms after recent medication changes. Specifically, she was recently taken off her Adderall and Effexor and started on Welbutrin.   Past Medical History:  Diagnosis Date  . ADHD (attention deficit hyperactivity disorder)   . Depression   . High cholesterol   . Hypertension     Past Surgical History:  Procedure Laterality Date  . TONSILLECTOMY    . TUBAL LIGATION      No family history on file.             Social History:  reports that she has been smoking cigarettes. She has been smoking about 0.50 packs per day. She has never used smokeless tobacco. She reports current alcohol use. She reports that she does not use drugs.  No Known Allergies  HOME MEDICATIONS:  No current facility-administered medications on file prior to encounter.   Current Outpatient Medications on File Prior to Encounter  Medication Sig Dispense Refill  . buPROPion (WELLBUTRIN SR) 150 MG 12 hr tablet Take 150 mg by mouth See admin instructions. Take 150 mg by mouth in the morning and increase to 150 mg two times a day after fully weaned off of Effexor XR (Venlafaxine)    . hydrochlorothiazide (HYDRODIURIL) 25 MG tablet Take 25 mg by mouth daily.    Marland Kitchen venlafaxine XR (EFFEXOR-XR) 37.5 MG 24 hr capsule Take 37.5 mg by mouth See admin instructions. Take 37.5 mg by mouth in the morning and an additional 37.5 mg daily as needed for withdrawal symptoms from this medication    . amphetamine-dextroamphetamine (ADDERALL) 15 MG tablet Take by mouth 2 (two) times daily.     Marland Kitchen amphetamine-dextroamphetamine (ADDERALL) 20 MG tablet Take one tab po every AM at 6am and one tab po at 10am and one half tab po at 1pm    . FLUoxetine (PROZAC) 20 MG capsule Take by mouth.    . hydrochlorothiazide (HYDRODIURIL) 25 MG tablet Take by mouth.    Marland Kitchen HYDROcodone-acetaminophen (NORCO) 5-325 MG tablet Take 1 tablet by mouth every 6 (six) hours as needed for severe pain. 20 tablet 0  . naproxen (NAPROSYN) 500 MG tablet Take 1 tablet (500 mg total) by mouth 2 (two) times daily as needed for mild pain, moderate pain or headache (TAKE WITH MEALS.). 20 tablet 0  . potassium chloride SA (KLOR-CON) 20 MEQ tablet Take 1 tablet (20 mEq total) by mouth 2 (two) times daily for 4 days. 8 tablet 0  . sertraline (ZOLOFT) 25 MG tablet Take 25 mg by  mouth daily.    Marland Kitchen zolpidem (AMBIEN) 10 MG tablet Take by mouth.       ROS:                                                                                                                                       Complains of dry eyes. Other ROS as per HPI.    Blood pressure 140/77, pulse 91, temperature 98.4 F (36.9 C), temperature source Oral, resp. rate 15, SpO2 98 %.   General Examination:                                                                                                       Physical Exam  HEENT-  [place]/AT   Lungs- Respirations unlabored Extremities- No edema  Neurological Examination Mental Status: Alert, oriented x 5, thought content appropriate.  Speech fluent without evidence of aphasia.  Able to follow all commands without difficulty. Cranial Nerves: II: Visual fields intact with no extinction to DSS. PERRL.  III,IV, VI: EOMI. No nystagmus. No ptosis.  V,VII: Smile symmetric, facial temp sensation normal bilaterally VIII: hearing intact to voice IX,X: No hypophonia XI: Symmetric shoulder shrug XII: No lingual dysarthria.  Motor: RUE 5/5 LUE 5/5 RLE with giveway weakness proximally and distally to HF, KE, KF, ADF and APF. The weakness is variable and distractable.  LLE 5/5 Sensory: Intact to FT and temp x 4 except for stocking distribution FT and temp sensory deficit to right foot extending to about 4 inches above the ankle.  Deep Tendon Reflexes: 2+ and symmetric throughout, including normal and symmetric achilles reflexes bilaterally  Plantars: Right: downgoing   Left: downgoing Cerebellar: No ataxia with FNF bilaterally  Gait: Antalgic gait   Lab Results: Basic Metabolic Panel: Recent Labs  Lab 04/15/20 1220  NA 135  K 3.6  CL 97*  CO2 29  GLUCOSE 106*  BUN 11  CREATININE 0.69  CALCIUM 8.7*    CBC: Recent Labs  Lab 04/15/20 1220  WBC 12.0*  NEUTROABS 8.9*  HGB 13.5  HCT 40.3  MCV 89.2  PLT 366    Cardiac Enzymes: Recent Labs   Lab 04/15/20 1743  CKTOTAL 1,897*    Lipid Panel: No results for input(s): CHOL, TRIG, HDL, CHOLHDL, VLDL, LDLCALC in the last 168 hours.  Imaging: CT HEAD WO CONTRAST  Result Date: 04/15/2020 CLINICAL DATA:  Neuro deficit, acute, stroke suspected EXAM: CT HEAD WITHOUT CONTRAST TECHNIQUE: Contiguous axial images were obtained from the base of the skull through the vertex without intravenous contrast. COMPARISON:  07/21/2003. FINDINGS: Brain: No acute infarct or intracranial hemorrhage. No mass lesion. No midline shift, ventriculomegaly or extra-axial fluid collection. Vascular: No hyperdense vessel or unexpected calcification. Skull: Negative for fracture or focal lesion. Sinuses/Orbits: Normal orbits. Minimal ethmoid sinus mucosal thickening. Trace right mastoid effusion. Other: None. IMPRESSION: No acute intracranial process. Electronically Signed   By: Stana Bunting M.D.   On: 04/15/2020 12:48    Assessment: 44 year old female with right leg numbness.  1. Exam reveals giveway weakness of RLE that is distractable/inconsistent, in conjunction with subjective stocking distribution sensory numbness on the right.  2. CT head negative.  3. CK elevated at 1897. Most likely from fall vs unwitnessed collapse to floor at home.  4. Amnestic for collapse to floor at home. DDx includes unwitnessed first time seizure and syncope.   Recommendations: 1. MRI brain to rule out stroke.  2. MRI L-spine to rule out lumbar disc herniation with possible associated right sided nerve root compressions (attention to L5 and S1).  3. Syncope work up.  4. EEG (ordered)   Electronically signed: Dr. Caryl Pina 04/15/2020, 11:03 PM

## 2020-04-16 NOTE — ED Notes (Signed)
EDP made aware of patients request for her lab and scan results.

## 2020-04-16 NOTE — ED Notes (Signed)
EDP made aware of patients request for more information and concerns about her HA and Leg.

## 2020-04-16 NOTE — ED Notes (Addendum)
Pt irritable, agitated, verbally aggressive with nursing staff, daughter currently visitor at bedside, informs this nurse she is leaving the hospital. This nurse informed her that she is leaving AMA .Encourgment and support provided, . Pt not open with communication with this nurse, not willing to stay. This nurse notified attending Dahal MD that pt is leaving AMA, pt not willing to stay. No new orders given. Pt ambulatory off unit with daughter. No s/s of acute distress noted.

## 2020-04-16 NOTE — Plan of Care (Addendum)
Neurology progress note:  Brief ID: 44 year old woman with shaking episodes at home, found down by husband with past medical history significant for depression, ADHD, hypertension and hyperlipidemia. Recently started on Wellbutrin and urine toxicology notably positive for cocaine  Exam: Vitals:   04/16/20 0053 04/16/20 0250  BP: 120/62 107/60  Pulse: 90 91  Resp: 13 18  Temp:    SpO2: 98% 98%   Gen: In bed, comfortable  Resp: non-labored breathing, no grossly audible wheezing Cardiac: Perfusing extremities well  Abd: soft, nondistended Musculoskeletal: Mild swelling of the right calf and foot compared to left  Neuro: MS: Alert, awake, oriented to person place and situation CN: Face symmetric, external ocular movements intact Motor: Significantly pain limited on the right lower extremity.  For example, with hip flexion the patient has reflexive appearing hip adduction, suggestive of abductor weakness, but she has equal strength when hip abduction is tested bilaterally.  She has significant pain in the calf and the sole of the foot.  Knee extension is at least 4+/5, knee flexion pain limited but at least 4/5, foot dorsiflexion at least 4-/5, foot plantar flexion at least 4/5 Sensory: Reports numbness throughout the right foot slightly up into the right calf/shin DTR: Patellar's are 2+ intact bilaterally, Achilles on the right is mildly diminished compared to the left although limited by patient relaxation secondary to pain, but formally 2+ bilaterally  Data personally reviewed: MRI brain reviewed and negative for acute intracranial process MRI lumbar spine reviewed and notable for predominantly right-sided myositis. U tox positive for cocaine EEG: --Recommended, but patient left AMA prior to study being completed  Assessment: Right leg weakness is most likely due to the observed myositis. Given significant giveaway weakness, cannot rule out a compressive nerve injury from being down.  This would be best evaluated by EMG/nerve conduction study 3 to 4 weeks post event if symptoms are persistent. Patient should also be counseled on seizure precautions given the shaking episodes and the fact that Wellbutrin lowers the seizure threshold and cocaine can be epileptogenic  Recommendations: -Continue to trend CK to peak, fluids as needed to be managed by primary team -Should right leg weakness persist, EMG nerve conduction study on an outpatient basis in 3 to 4 weeks -No need for antiseizure medication given this was a provoked event; also it is not 100% certain that this event does represent a seizure; outpatient EEG at the discretion of her outpatient neurologist -Standard seizure precautions: (Please include in patient's discharge instructions on discharge) Per [facility] statutes, patients with seizures are not allowed to drive until  they have been seizure-free for six months. Use caution when using heavy equipment or power tools. Avoid working on ladders or at heights. Take showers instead of baths. Ensure the water temperature is not too high on the home water heater. Do not go swimming alone. When caring for infants or small children, sit down when holding, feeding, or changing them to minimize risk of injury to the child in the event you have a seizure.  To reduce risk of seizures, maintain good sleep hygiene avoid alcohol and illicit drug use, take all anti-seizure medications as prescribed.  -Outpatient neurology follow-up in 4 to 6 weeks, order placed  Brooke Dare MD-PhD [facility] [phone]

## 2020-04-16 NOTE — H&P (Signed)
History and Physical    Alice Purseronya P Virts ZOX:[id number] DOB: 07/16/1975 DOA: 04/15/2020  PCP: Lindley MagnusLong, Ashley, PA-C  Patient coming from: Home.  Chief Complaint: Loss of consciousness.  HPI: Alice Martinez is a 44 y.o. female with history of hypertension, depression, hyperlipidemia, ADHD was brought to the ER after patient was found to have persistent right lower extremity weakness with pain and had an episode of loss of consciousness.  Patient states over the last couple of days patient's husband noticed some abnormal behavior during sleep and patient was talking out of her mind and some abnormal jerking movements.  Patient admits to taking cocaine 2 days ago.  Patient also has had her antidepressant and ADHD medicines changed by her primary care physician.  Adderall was discontinued which patient only takes as needed.  Patient's Effexor dose has been decreased and Wellbutrin introduced.  Yesterday morning around 1 AM when patient went to kitchen to have something she lost consciousness.  Patient's husband found her on 6 AM unconscious on the floor.  Since then patient also was found to have right lower extremity weakness.  Was brought to the ER.  Denies any chest pain or shortness of breath.  ED Course: In the ER on exam patient does have right lower extremity weakness but has significant pain on moving the right lower extremity.  EKG shows normal sinus rhythm with normal QTC WBC 12 LFTs mildly elevated.  Ammonia levels are normal CK levels was 1800 CT head was unremarkable Covid test was negative urine drug screen is positive.  On-call neurologist was consulted requested getting MRI of the brain L-spine and EEG.  And syncope work-up.  Review of Systems: As per HPI, rest all negative.   Past Medical History:  Diagnosis Date  . ADHD (attention deficit hyperactivity disorder)   . Depression   . High cholesterol   . Hypertension     Past Surgical History:  Procedure Laterality Date  .  TONSILLECTOMY    . TUBAL LIGATION       reports that she has been smoking cigarettes. She has been smoking about 0.50 packs per day. She has never used smokeless tobacco. She reports current alcohol use. She reports that she does not use drugs.  No Known Allergies  Family History  Family history unknown: Yes    Prior to Admission medications   Medication Sig Start Date End Date Taking? Authorizing Provider  buPROPion (WELLBUTRIN SR) 150 MG 12 hr tablet Take 150 mg by mouth See admin instructions. Take 150 mg by mouth in the morning and increase to 150 mg two times a day after fully weaned off of Effexor XR (Venlafaxine)   Yes [provider]  hydrochlorothiazide (HYDRODIURIL) 25 MG tablet Take 25 mg by mouth daily.   Yes [provider]  venlafaxine XR (EFFEXOR-XR) 37.5 MG 24 hr capsule Take 37.5 mg by mouth See admin instructions. Take 37.5 mg by mouth in the morning and an additional 37.5 mg daily as needed for withdrawal symptoms from this medication   Yes [provider]  amphetamine-dextroamphetamine (ADDERALL) 15 MG tablet Take by mouth 2 (two) times daily.  07/14/16   [provider]  amphetamine-dextroamphetamine (ADDERALL) 20 MG tablet Take one tab po every AM at 6am and one tab po at 10am and one half tab po at 1pm 02/25/19   [provider]  FLUoxetine (PROZAC) 20 MG capsule Take by mouth. 05/16/16   [provider]  hydrochlorothiazide (HYDRODIURIL) 25 MG tablet  Take by mouth. 01/28/19   [provider]  HYDROcodone-acetaminophen (NORCO) 5-325 MG tablet Take 1 tablet by mouth every 6 (six) hours as needed for severe pain. 08/01/17   Street, [place], PA-C  naproxen (NAPROSYN) 500 MG tablet Take 1 tablet (500 mg total) by mouth 2 (two) times daily as needed for mild pain, moderate pain or headache (TAKE WITH MEALS.). 08/01/17   Street, [place], PA-C  potassium chloride SA (KLOR-CON) 20 MEQ tablet Take 1 tablet (20 mEq total)  by mouth 2 (two) times daily for 4 days. 03/10/19 03/14/19  Gailen Shelter, PA  sertraline (ZOLOFT) 25 MG tablet Take 25 mg by mouth daily. 02/13/19   [provider]  zolpidem (AMBIEN) 10 MG tablet Take by mouth. 05/05/16   [provider]    Physical Exam: Constitutional: Moderately built and nourished. Vitals:   04/15/20 2028 04/15/20 2342 04/16/20 0053 04/16/20 0250  BP: 140/77 116/74 120/62 107/60  Pulse: 91 94 90 91  Resp: 15 14 13 18   Temp:      TempSrc:      SpO2: 98% 98% 98% 98%   Eyes: Anicteric no pallor. ENMT: No discharge from the ears eyes nose or mouth. Neck: No mass felt.  No neck rigidity. Respiratory: No rhonchi or crepitations. Cardiovascular: S1-S2 heard. Abdomen: Soft nontender bowel sounds present. Musculoskeletal: Pain on moving right lower extremity. Skin: Chronic skin changes. Neurologic: Alert awake oriented to time place and person.  Moves all extremities 5 x 5.  But right lower extremity is decreased due to pain.  No facial asymmetry tongue is midline. Psychiatric: Appears normal.  Normal affect.   Labs on Admission: I have personally reviewed following labs and imaging studies  CBC: Recent Labs  Lab 04/15/20 1220  WBC 12.0*  NEUTROABS 8.9*  HGB 13.5  HCT 40.3  MCV 89.2  PLT 366   Basic Metabolic Panel: Recent Labs  Lab 04/15/20 1220  NA 135  K 3.6  CL 97*  CO2 29  GLUCOSE 106*  BUN 11  CREATININE 0.69  CALCIUM 8.7*   GFR: CrCl cannot be calculated (Unknown ideal weight.). Liver Function Tests: Recent Labs  Lab 04/15/20 1220  AST 49*  ALT 45*  ALKPHOS 62  BILITOT 0.4  PROT 6.2*  ALBUMIN 3.6   No results for input(s): LIPASE, AMYLASE in the last 168 hours. Recent Labs  Lab 04/15/20 1842  AMMONIA 23   Coagulation Profile: Recent Labs  Lab 04/15/20 1220  INR 1.0   Cardiac Enzymes: Recent Labs  Lab 04/15/20 1743  CKTOTAL 1,897*   BNP (last 3 results) No results for input(s): PROBNP in the  last 8760 hours. HbA1C: No results for input(s): HGBA1C in the last 72 hours. CBG: Recent Labs  Lab 04/15/20 1841  GLUCAP 101*   Lipid Profile: No results for input(s): CHOL, HDL, LDLCALC, TRIG, CHOLHDL, LDLDIRECT in the last 72 hours. Thyroid Function Tests: No results for input(s): TSH, T4TOTAL, FREET4, T3FREE, THYROIDAB in the last 72 hours. Anemia Panel: No results for input(s): VITAMINB12, FOLATE, FERRITIN, TIBC, IRON, RETICCTPCT in the last 72 hours. Urine analysis:    Component Value Date/Time   COLORURINE YELLOW 04/15/2020 1825   APPEARANCEUR HAZY (A) 04/15/2020 1825   LABSPEC 1.014 04/15/2020 1825   PHURINE 5.0 04/15/2020 1825   GLUCOSEU NEGATIVE 04/15/2020 1825   HGBUR NEGATIVE 04/15/2020 1825   BILIRUBINUR NEGATIVE 04/15/2020 1825   KETONESUR NEGATIVE 04/15/2020 1825   PROTEINUR NEGATIVE 04/15/2020 1825   UROBILINOGEN 0.2 08/28/2010 1100  NITRITE NEGATIVE 04/15/2020 1825   LEUKOCYTESUR NEGATIVE 04/15/2020 1825   Sepsis Labs: @LABRCNTIP (procalcitonin:4,lacticidven:4) ) Recent Results (from the past 240 hour(s))  Resp Panel by RT-PCR (Flu A&B, Covid) Nasopharyngeal Swab     Status: None   Collection Time: 04/16/20  2:48 AM   Specimen: Nasopharyngeal Swab; Nasopharyngeal(NP) swabs in vial transport medium  Result Value Ref Range Status   SARS Coronavirus 2 by RT PCR NEGATIVE NEGATIVE Final    Comment: (NOTE) SARS-CoV-2 target nucleic acids are NOT DETECTED.  The SARS-CoV-2 RNA is generally detectable in upper respiratory specimens during the acute phase of infection. The lowest concentration of SARS-CoV-2 viral copies this assay can detect is 138 copies/mL. A negative result does not preclude SARS-Cov-2 infection and should not be used as the sole basis for treatment or other patient management decisions. A negative result may occur with  improper specimen collection/handling, submission of specimen other than nasopharyngeal swab, presence of viral  mutation(s) within the areas targeted by this assay, and inadequate number of viral copies(<138 copies/mL). A negative result must be combined with clinical observations, patient history, and epidemiological information. The expected result is Negative.  Fact Sheet for Patients:  04/18/20  Fact Sheet for Healthcare Providers:  BloggerCourse.com  This test is no t yet approved or cleared by the [employer]iousBro[employer].[place] FDA and  has been authorized for detection and/or diagnosis of SARS-CoV-2 by FDA under an Emergency Use Authorization (EUA). This EUA will remain  in effect (meaning this test can be used) for the duration of the COVID-19 declaration under Section 564(b)(1) of the Act, 21 U.S.C.section 360bbb-3(b)(1), unless the authorization is terminated  or revoked sooner.       Influenza A by PCR NEGATIVE NEGATIVE Final   Influenza B by PCR NEGATIVE NEGATIVE Final    Comment: (NOTE) The Xpert Xpress SARS-CoV-2/FLU/RSV plus assay is intended as an aid in the diagnosis of influenza from Nasopharyngeal swab specimens and should not be used as a sole basis for treatment. Nasal washings and aspirates are unacceptable for Xpert Xpress SARS-CoV-2/FLU/RSV testing.  Fact Sheet for Patients: [place]  Fact Sheet for Healthcare Providers: BloggerCourse.com  This test is not yet approved or cleared by the SeriousBroker.it FDA and has been authorized for detection and/or diagnosis of SARS-CoV-2 by FDA under an Emergency Use Authorization (EUA). This EUA will remain in effect (meaning this test can be used) for the duration of the COVID-19 declaration under Section 564(b)(1) of the Act, 21 U.S.C. section 360bbb-3(b)(1), unless the authorization is terminated or revoked.  Performed at [facility], [address], [place], [place]      Radiological Exams on  Admission: CT HEAD WO CONTRAST  Result Date: 04/15/2020 CLINICAL DATA:  Neuro deficit, acute, stroke suspected EXAM: CT HEAD WITHOUT CONTRAST TECHNIQUE: Contiguous axial images were obtained from the base of the skull through the vertex without intravenous contrast. COMPARISON:  07/21/2003. FINDINGS: Brain: No acute infarct or intracranial hemorrhage. No mass lesion. No midline shift, ventriculomegaly or extra-axial fluid collection. Vascular: No hyperdense vessel or unexpected calcification. Skull: Negative for fracture or focal lesion. Sinuses/Orbits: Normal orbits. Minimal ethmoid sinus mucosal thickening. Trace right mastoid effusion. Other: None. IMPRESSION: No acute intracranial process. Electronically Signed   By: 07/23/2003 M.D.   On: 04/15/2020 12:48    EKG: Independently reviewed.  Normal sinus rhythm.  Assessment/Plan Active Problems:   PTSD (post-traumatic stress disorder)   Syncope   Rhabdomyolysis   Cocaine abuse (HCC)   Weakness  of right lower extremity    1. Syncope cause not clear but in the setting of cocaine could have been the cause.  Follow in telemetry.  Check 2D echo.  Follow cardiac markers.  There is also concern for possible seizures for which EEG has been ordered along with MRI brain. 2. Right lower extremity weakness -patient has significant pain on moving right lower extremity which could be due to her fall.  X-ray pelvis are pending along with which patient also has MRI brain and MRI L-spine ordered by neurologist. 3. Abnormal movements noted by patient husband for which EEG has been ordered. 4. Cocaine abuse which could be contributing to some of the symptoms.  Advised about quitting. 5. Rhabdomyolysis likely from fall for which patient is receiving fluid hold hydrochlorothiazide. 6. Hypertension we will keep patient on as needed IV hydralazine hold hydrochlorothiazide due to rhabdomyolysis.  If stroke rule out the need for the aggressive control of  blood pressure. 7. History of depression presently on Wellbutrin and tapering dose of Effexor. 8. History of ADHD off Adderall. 9. Tobacco abuse advised about quitting.   10. Elevated LFTs check acute hepatitis panel with next blood draw. 11. Hyperlipidemia we will hold off statins given that patient is rhabdomyolysis now.   DVT prophylaxis: SCDs for now until we make sure there is no lesions in the MRI L-spine.  Following which if nothing acute with in place patient on Lovenox. Code Status: Full code. Family Communication: Patient's daughter at the bedside. Disposition Plan: Home. Consults called: Neurology. Admission status: Observation.   Eduard Clos MD [facility] Pager [phone].  If 7PM-7AM, please contact night-coverage [email or web address] Password [facility]  04/16/2020, 5:23 AM

## 2020-04-16 NOTE — ED Notes (Signed)
Pt transported to xray 

## 2020-04-16 NOTE — Progress Notes (Signed)
  Echocardiogram 2D Echocardiogram has been attempted. Patient refused exam.  Alice Martinez 04/16/2020, 9:53 AM

## 2020-04-21 ENCOUNTER — Emergency Department (HOSPITAL_COMMUNITY)
Admission: EM | Admit: 2020-04-21 | Discharge: 2020-04-21 | Disposition: A | Discharge status: Home / Self Care | Payer: Medicaid Other | Attending: Emergency Medicine | Admitting: Emergency Medicine

## 2020-04-21 ENCOUNTER — Emergency Department (HOSPITAL_COMMUNITY)
Admission: EM | Admit: 2020-04-21 | Discharge: 2020-04-22 | Disposition: A | Discharge status: Home / Self Care | Payer: Self-pay | Attending: Emergency Medicine | Admitting: Emergency Medicine

## 2020-04-21 ENCOUNTER — Other Ambulatory Visit: Payer: Self-pay

## 2020-04-21 ENCOUNTER — Encounter (HOSPITAL_COMMUNITY): Payer: Self-pay

## 2020-04-21 DIAGNOSIS — Z79899 Other long term (current) drug therapy: Secondary | ICD-10-CM | POA: Insufficient documentation

## 2020-04-21 DIAGNOSIS — F17210 Nicotine dependence, cigarettes, uncomplicated: Secondary | ICD-10-CM | POA: Insufficient documentation

## 2020-04-21 DIAGNOSIS — R55 Syncope and collapse: Secondary | ICD-10-CM | POA: Insufficient documentation

## 2020-04-21 DIAGNOSIS — I10 Essential (primary) hypertension: Secondary | ICD-10-CM | POA: Insufficient documentation

## 2020-04-21 DIAGNOSIS — W19XXXA Unspecified fall, initial encounter: Secondary | ICD-10-CM

## 2020-04-21 DIAGNOSIS — Y92410 Unspecified street and highway as the place of occurrence of the external cause: Secondary | ICD-10-CM | POA: Insufficient documentation

## 2020-04-21 DIAGNOSIS — S92351A Displaced fracture of fifth metatarsal bone, right foot, initial encounter for closed fracture: Secondary | ICD-10-CM

## 2020-04-21 DIAGNOSIS — S92354A Nondisplaced fracture of fifth metatarsal bone, right foot, initial encounter for closed fracture: Secondary | ICD-10-CM | POA: Insufficient documentation

## 2020-04-21 DIAGNOSIS — T401X1A Poisoning by heroin, accidental (unintentional), initial encounter: Secondary | ICD-10-CM

## 2020-04-21 DIAGNOSIS — Z20822 Contact with and (suspected) exposure to covid-19: Secondary | ICD-10-CM | POA: Insufficient documentation

## 2020-04-21 LAB — CBC WITH DIFFERENTIAL/PLATELET
Abs Immature Granulocytes: 0.03 10*3/uL (ref 0.00–0.07)
Basophils Absolute: 0.1 10*3/uL (ref 0.0–0.1)
Basophils Relative: 1 %
Eosinophils Absolute: 0.2 10*3/uL (ref 0.0–0.5)
Eosinophils Relative: 1 %
HCT: 38.4 % (ref 36.0–46.0)
Hemoglobin: 12.5 g/dL (ref 12.0–15.0)
Immature Granulocytes: 0 %
Lymphocytes Relative: 16 %
Lymphs Abs: 1.9 10*3/uL (ref 0.7–4.0)
MCH: 29.6 pg (ref 26.0–34.0)
MCHC: 32.6 g/dL (ref 30.0–36.0)
MCV: 91 fL (ref 80.0–100.0)
Monocytes Absolute: 0.7 10*3/uL (ref 0.1–1.0)
Monocytes Relative: 5 %
Neutro Abs: 9.4 10*3/uL — ABNORMAL HIGH (ref 1.7–7.7)
Neutrophils Relative %: 77 %
Platelets: 269 10*3/uL (ref 150–400)
RBC: 4.22 MIL/uL (ref 3.87–5.11)
RDW: 13.4 % (ref 11.5–15.5)
WBC: 12.3 10*3/uL — ABNORMAL HIGH (ref 4.0–10.5)
nRBC: 0 % (ref 0.0–0.2)

## 2020-04-21 LAB — URINALYSIS, ROUTINE W REFLEX MICROSCOPIC
Bilirubin Urine: NEGATIVE
Glucose, UA: NEGATIVE mg/dL
Ketones, ur: NEGATIVE mg/dL
Leukocytes,Ua: NEGATIVE
Nitrite: NEGATIVE
Protein, ur: NEGATIVE mg/dL
Specific Gravity, Urine: 1.001 — ABNORMAL LOW (ref 1.005–1.030)
pH: 6 (ref 5.0–8.0)

## 2020-04-21 LAB — RAPID URINE DRUG SCREEN, HOSP PERFORMED
Amphetamines: NOT DETECTED
Barbiturates: NOT DETECTED
Benzodiazepines: POSITIVE — AB
Cocaine: NOT DETECTED
Opiates: NOT DETECTED
Tetrahydrocannabinol: NOT DETECTED

## 2020-04-21 LAB — BASIC METABOLIC PANEL
Anion gap: 8 (ref 5–15)
BUN: 13 mg/dL (ref 6–20)
CO2: 25 mmol/L (ref 22–32)
Calcium: 7.9 mg/dL — ABNORMAL LOW (ref 8.9–10.3)
Chloride: 105 mmol/L (ref 98–111)
Creatinine, Ser: 0.63 mg/dL (ref 0.44–1.00)
GFR, Estimated: >60 mL/min (ref >60)
Glucose, Bld: 84 mg/dL (ref 70–99)
Potassium: 3.8 mmol/L (ref 3.5–5.1)
Sodium: 138 mmol/L (ref 135–145)
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
## 2020-04-21 NOTE — Discharge Instructions (Signed)
Please follow-up with your neurologist as well as your primary care doctor.  You should not drive until you are cleared by your neurologist or primary care doctor.  Strongly recommend not abusing substances that are illegal.  If you develop chest pain difficulty breathing, worsening of your leg weakness or other new concerning symptom, return to ER for reassessment.

## 2020-04-21 NOTE — ED Notes (Signed)
Pt cooperative with blood draw by this RN with officers at bedside
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
## 2020-04-22 ENCOUNTER — Emergency Department (HOSPITAL_COMMUNITY): Payer: Medicaid Other

## 2020-04-22 ENCOUNTER — Emergency Department (HOSPITAL_COMMUNITY): Payer: Self-pay

## 2020-04-22 ENCOUNTER — Emergency Department (HOSPITAL_BASED_OUTPATIENT_CLINIC_OR_DEPARTMENT_OTHER)
Admit: 2020-04-22 | Discharge: 2020-04-22 | Disposition: A | Discharge status: Home / Self Care | Payer: Self-pay | Attending: Emergency Medicine | Admitting: Emergency Medicine

## 2020-04-22 DIAGNOSIS — M79609 Pain in unspecified limb: Secondary | ICD-10-CM

## 2020-04-22 DIAGNOSIS — R55 Syncope and collapse: Secondary | ICD-10-CM

## 2020-04-22 LAB — COMPREHENSIVE METABOLIC PANEL
ALT: 40 U/L (ref 0–44)
AST: 34 U/L (ref 15–41)
Albumin: 3.1 g/dL — ABNORMAL LOW (ref 3.5–5.0)
Alkaline Phosphatase: 58 U/L (ref 38–126)
Anion gap: 9 (ref 5–15)
BUN: 6 mg/dL (ref 6–20)
CO2: 25 mmol/L (ref 22–32)
Calcium: 8.4 mg/dL — ABNORMAL LOW (ref 8.9–10.3)
Chloride: 104 mmol/L (ref 98–111)
Creatinine, Ser: 0.74 mg/dL (ref 0.44–1.00)
GFR, Estimated: >60 mL/min (ref >60)
Glucose, Bld: 92 mg/dL (ref 70–99)
Potassium: 3.5 mmol/L (ref 3.5–5.1)
Sodium: 138 mmol/L (ref 135–145)
Total Bilirubin: 0.6 mg/dL (ref 0.3–1.2)
Total Protein: 5.2 g/dL — ABNORMAL LOW (ref 6.5–8.1)

## 2020-04-22 LAB — CBC WITH DIFFERENTIAL/PLATELET
Abs Immature Granulocytes: 0.02 10*3/uL (ref 0.00–0.07)
Basophils Absolute: 0 10*3/uL (ref 0.0–0.1)
Basophils Relative: 1 %
Eosinophils Absolute: 0.1 10*3/uL (ref 0.0–0.5)
Eosinophils Relative: 2 %
HCT: 38.1 % (ref 36.0–46.0)
Hemoglobin: 11.8 g/dL — ABNORMAL LOW (ref 12.0–15.0)
Immature Granulocytes: 0 %
Lymphocytes Relative: 28 %
Lymphs Abs: 1.6 10*3/uL (ref 0.7–4.0)
MCH: 28.3 pg (ref 26.0–34.0)
MCHC: 31 g/dL (ref 30.0–36.0)
MCV: 91.4 fL (ref 80.0–100.0)
Monocytes Absolute: 0.4 10*3/uL (ref 0.1–1.0)
Monocytes Relative: 7 %
Neutro Abs: 3.6 10*3/uL (ref 1.7–7.7)
Neutrophils Relative %: 62 %
Platelets: 249 10*3/uL (ref 150–400)
RBC: 4.17 MIL/uL (ref 3.87–5.11)
RDW: 13.2 % (ref 11.5–15.5)
WBC: 5.8 10*3/uL (ref 4.0–10.5)
nRBC: 0 % (ref 0.0–0.2)

## 2020-04-22 LAB — RESP PANEL BY RT-PCR (FLU A&B, COVID) ARPGX2
Influenza A by PCR: NEGATIVE
Influenza B by PCR: NEGATIVE
SARS Coronavirus 2 by RT PCR: NEGATIVE

## 2020-04-22 LAB — CK: Total CK: 89 U/L (ref 38–234)

## 2020-04-22 IMAGING — CR DG ANKLE COMPLETE 3+V*R*
3 series · 3 of 3 positions shown · non-contrast
Comparison: None.

CLINICAL DATA: Pain following fall

EXAM:
RIGHT ANKLE - COMPLETE 3+ VIEW

[ankle ap]
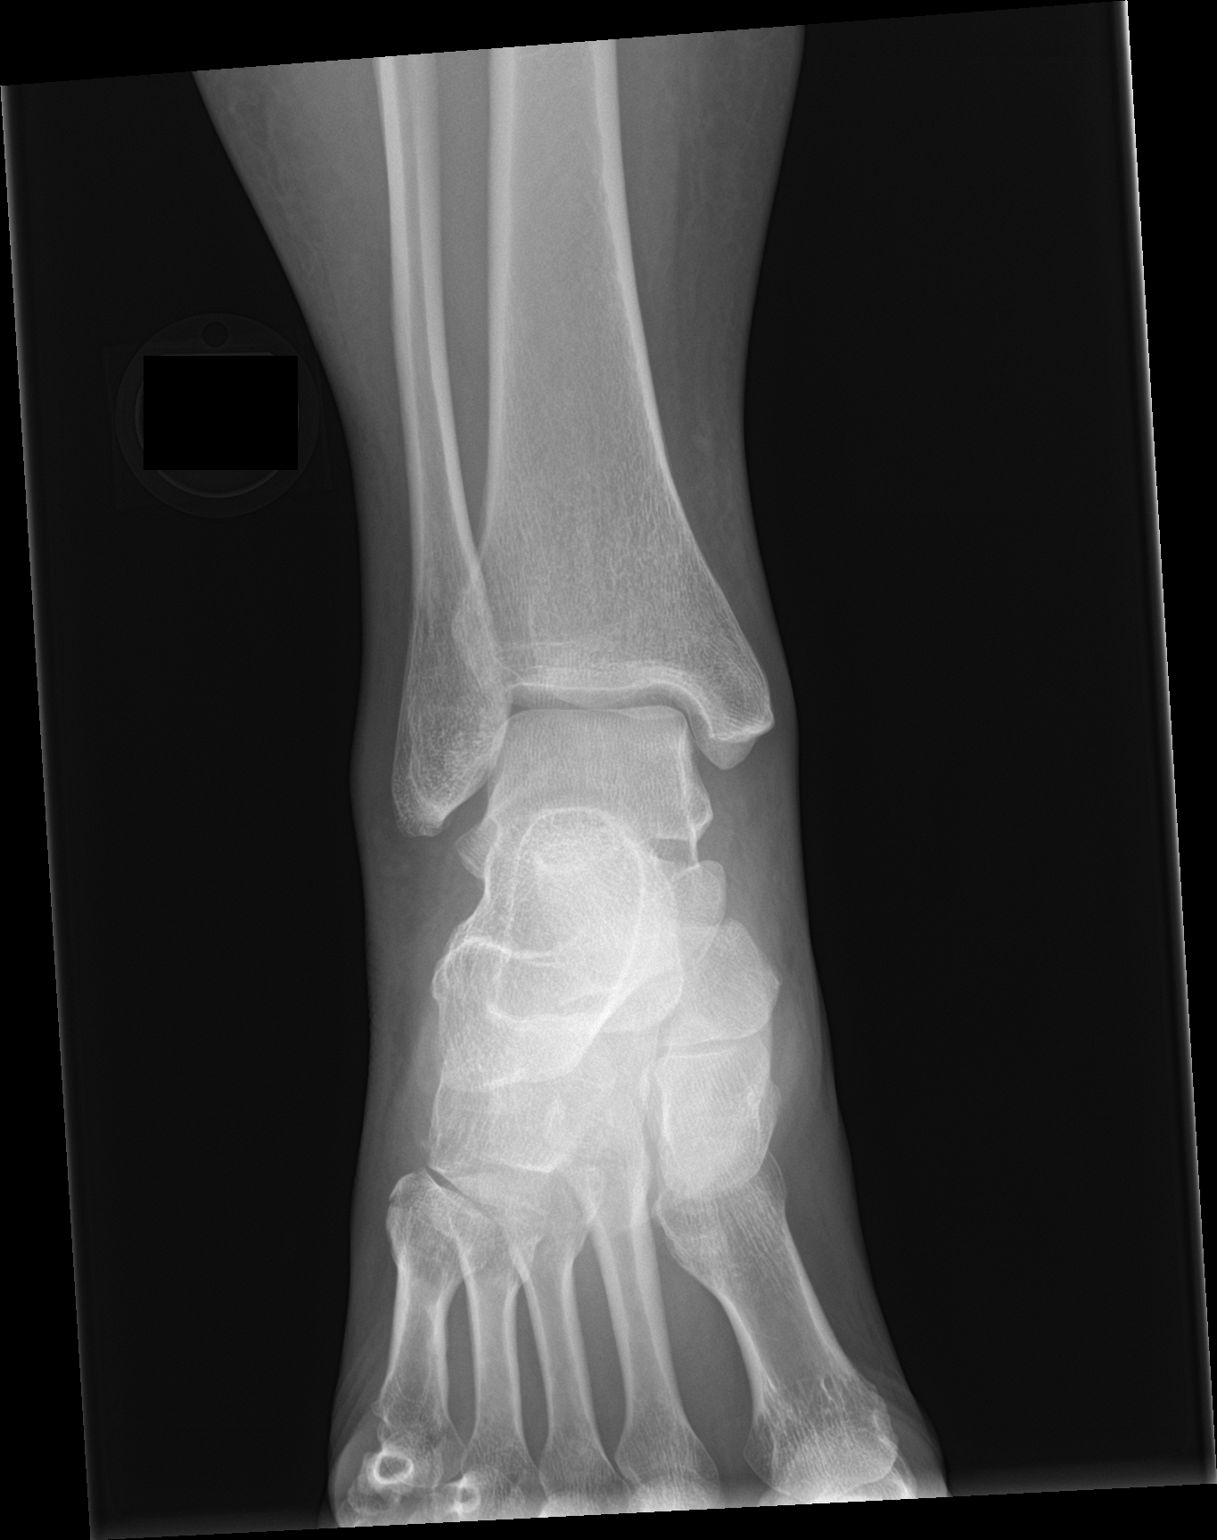

[ankle obl]
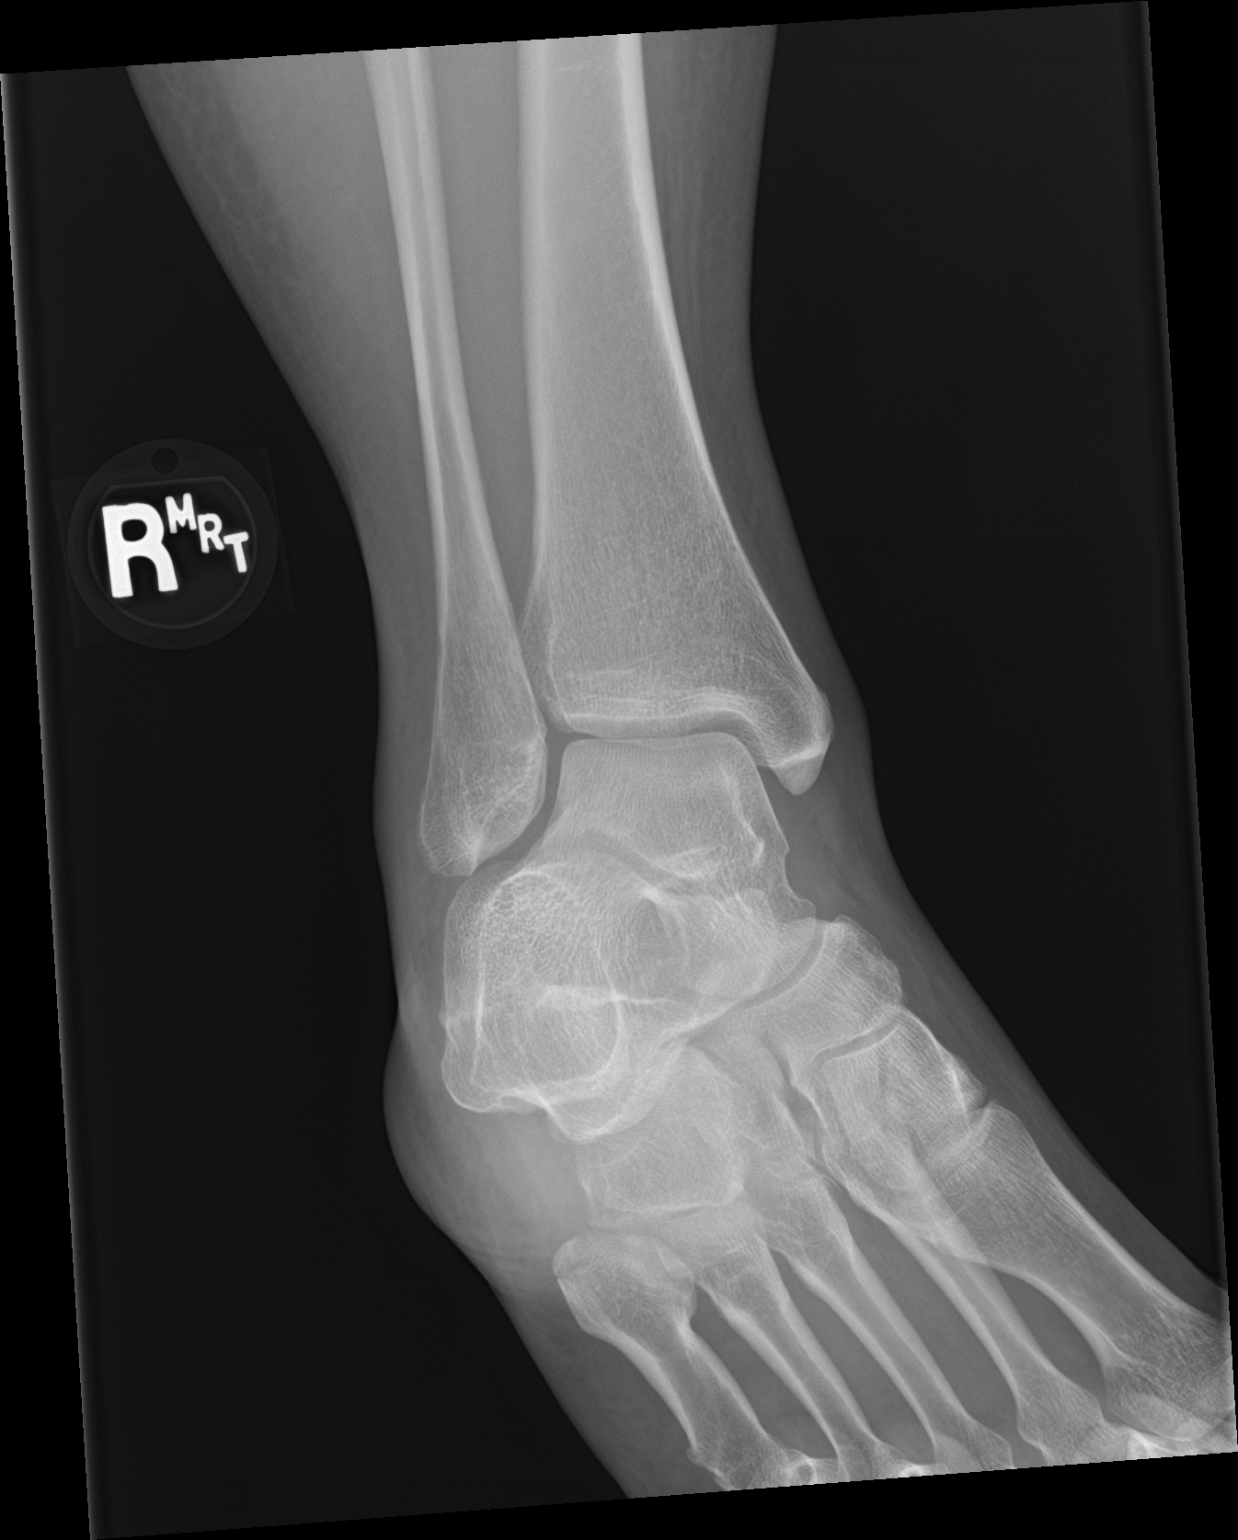

[ankle lat]
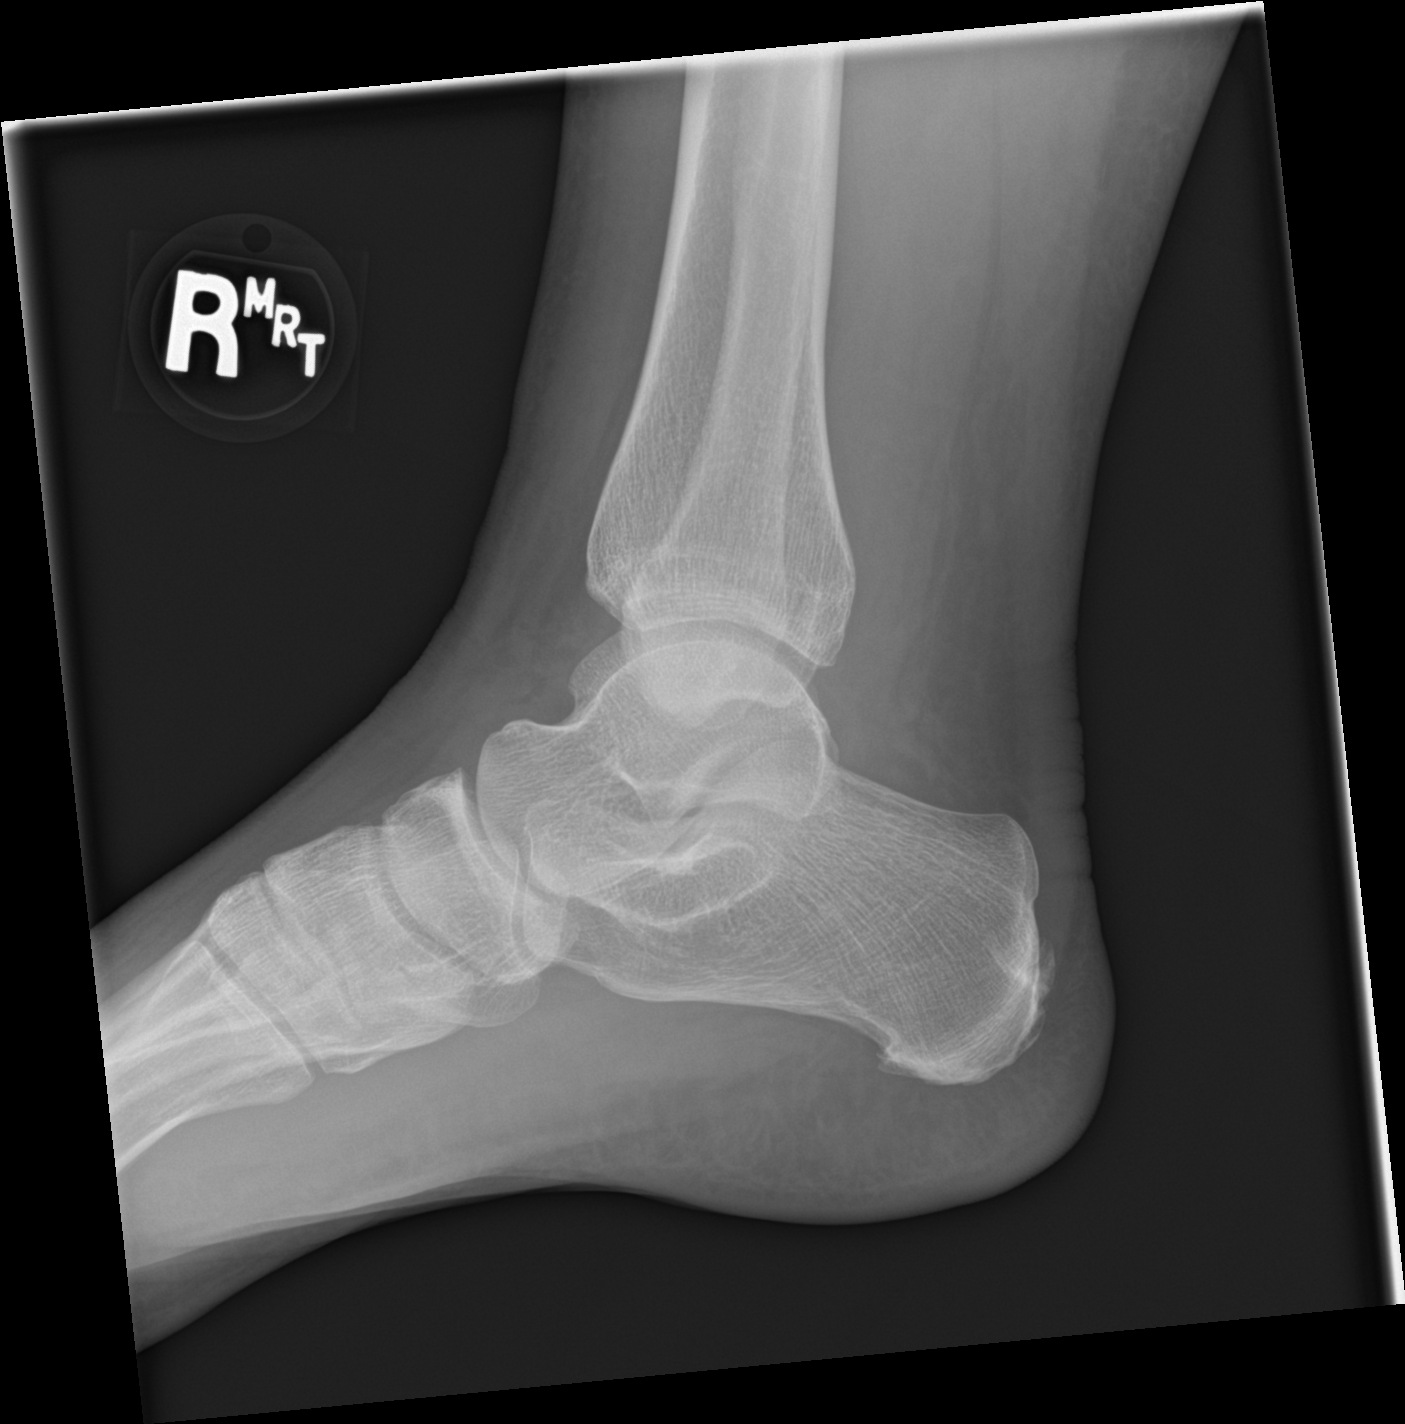

[3 of 3 positions shown; findings below may reference images not displayed]

FINDINGS: Frontal, oblique, and lateral views were obtained. There is a
fracture of the proximal aspect of the fifth proximal metatarsal
with alignment essentially anatomic. No other evident fracture. No
joint effusion. No joint space narrowing or erosion. Ankle mortise
appears intact. There are small calcaneal spurs.
IMPRESSION: Fracture of proximal aspect fifth metatarsal with alignment
essentially anatomic. No other fracture. Ankle mortise appears
intact. No appreciable joint space narrowing. There are small
calcaneal spurs.

## 2020-04-22 IMAGING — CR DG FOOT COMPLETE 3+V*R*
3 series · 3 of 3 positions shown · non-contrast
Comparison: None.

CLINICAL DATA: Pain following fall

EXAM:
RIGHT FOOT COMPLETE - 3+ VIEW

[foot ap]
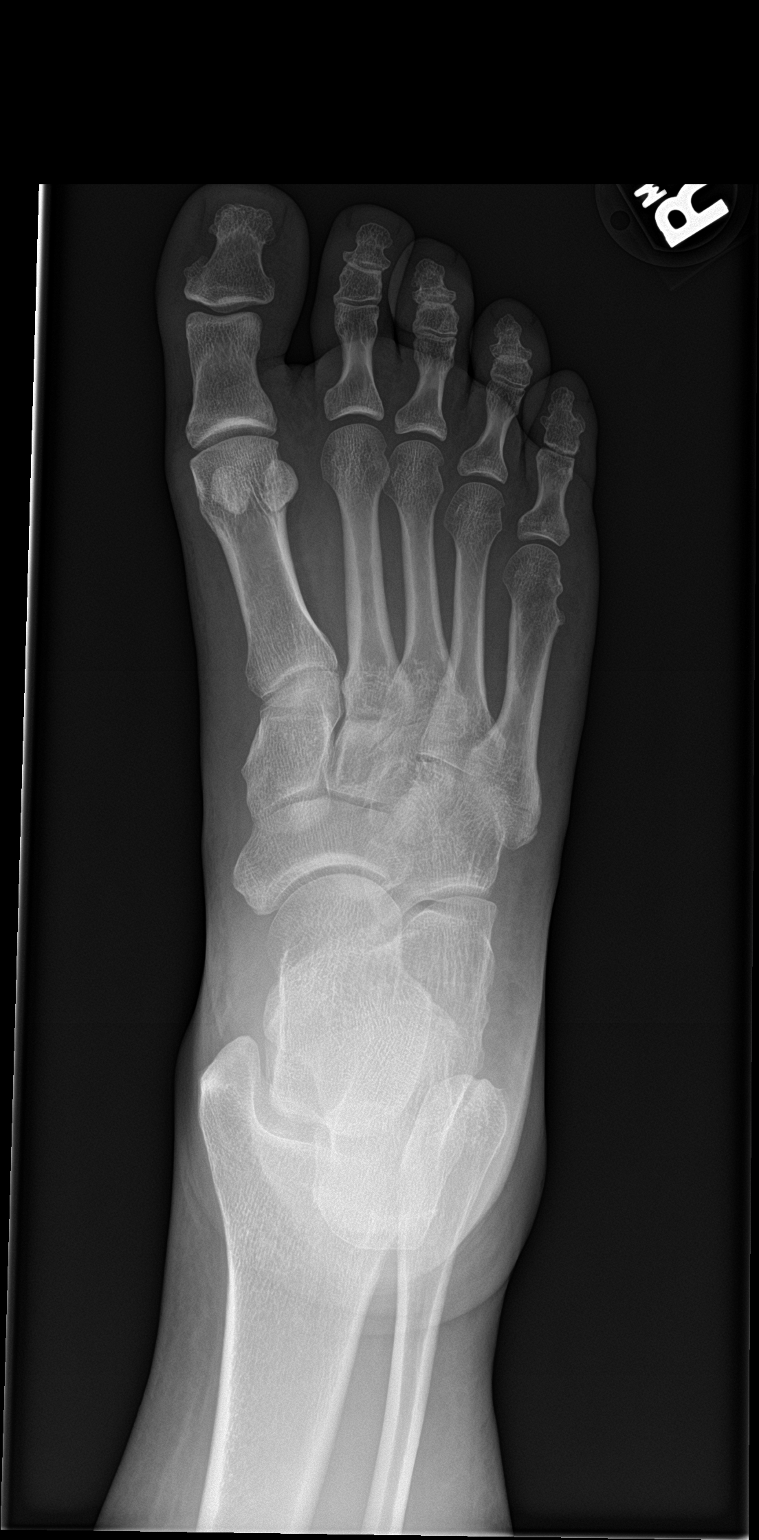

[foot obl]
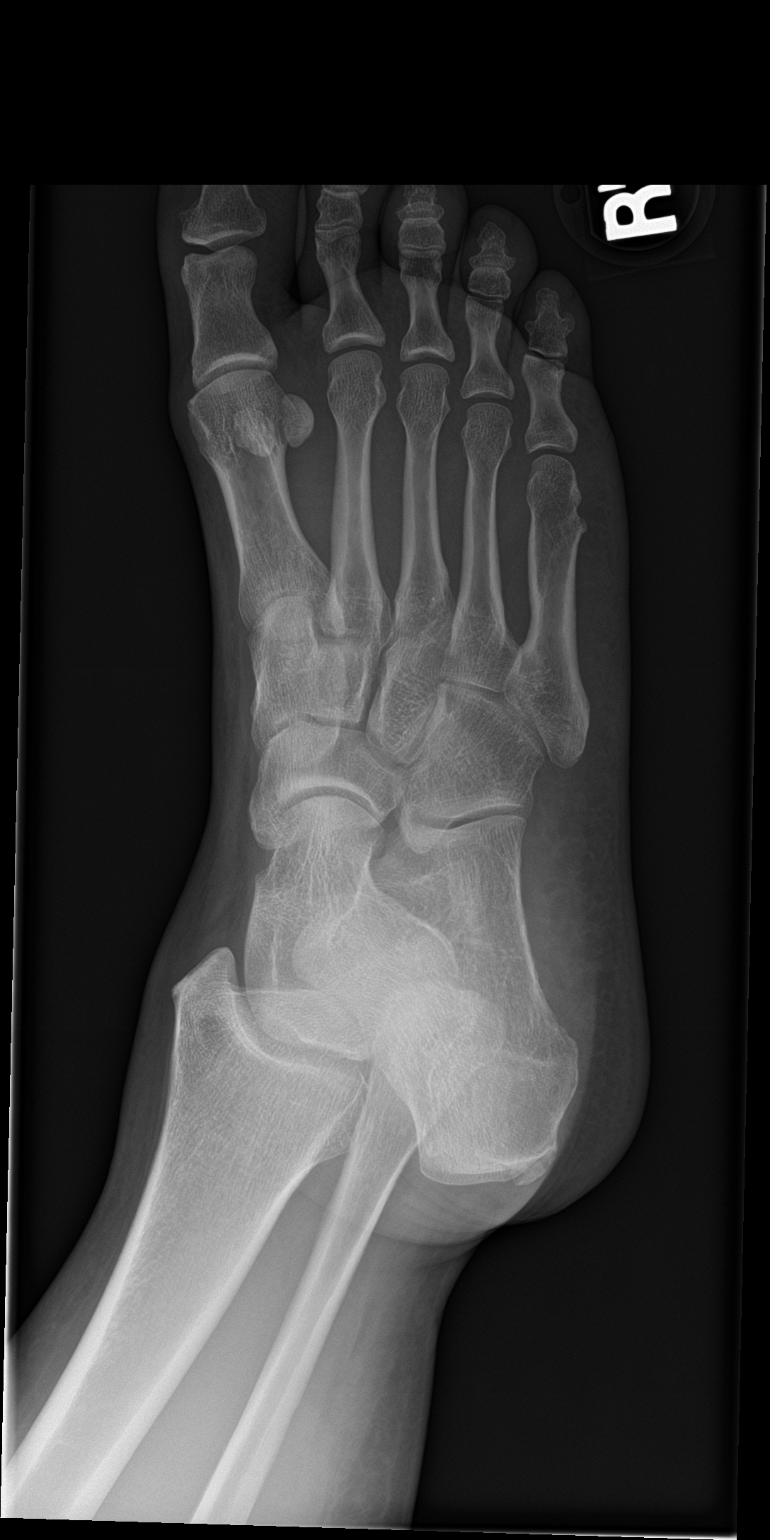

[foot lat]
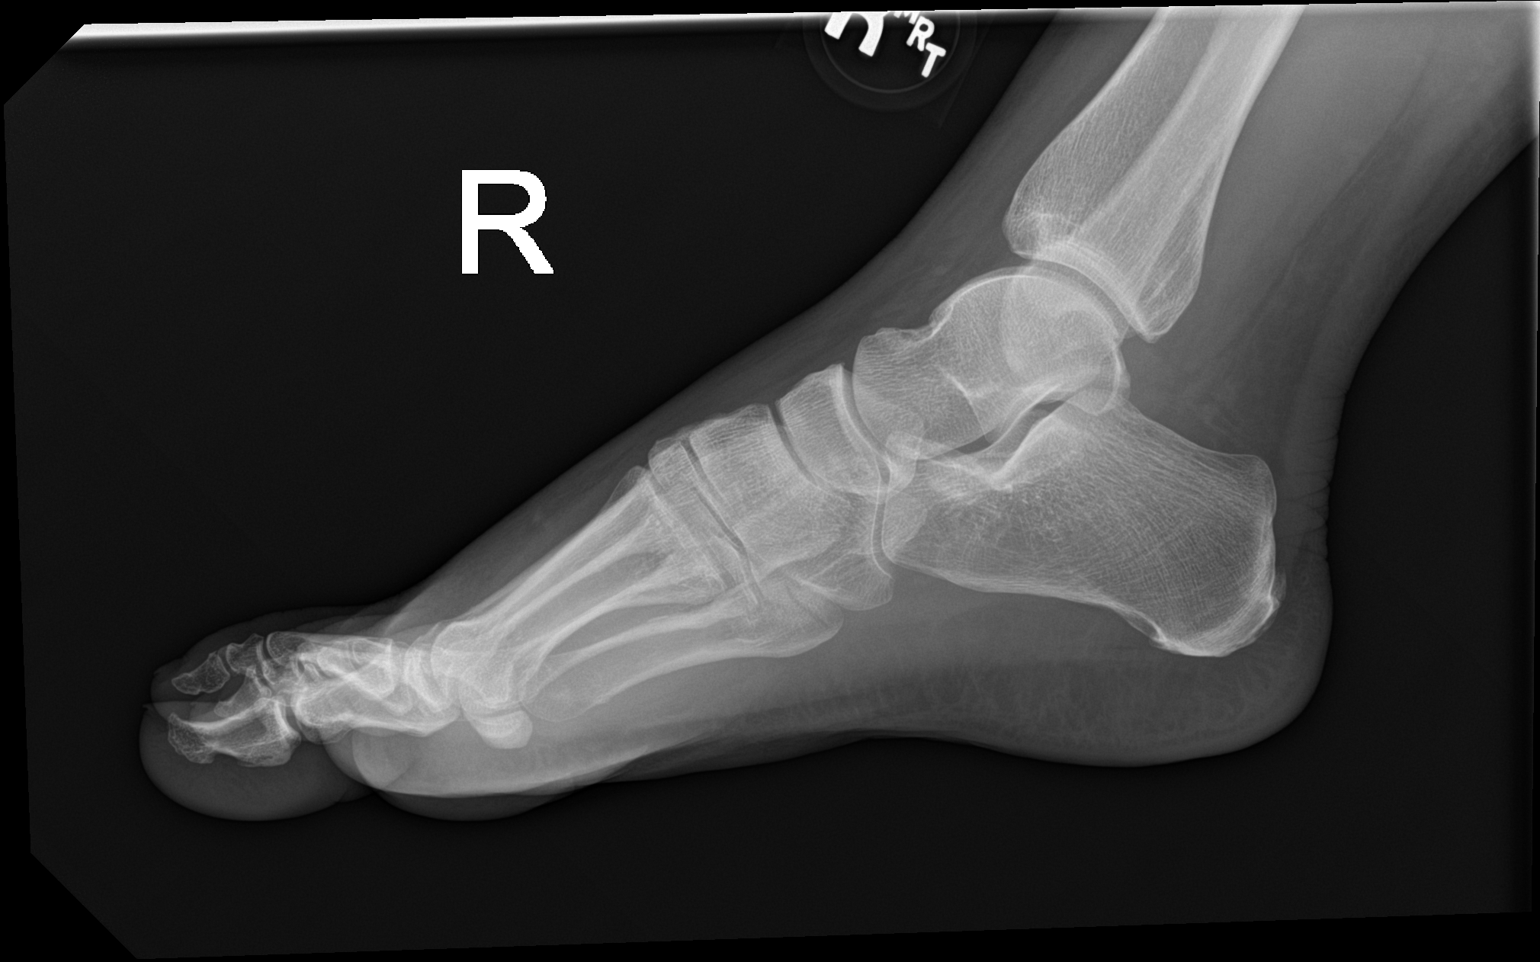

[3 of 3 positions shown; findings below may reference images not displayed]

FINDINGS: Frontal, oblique, and lateral views were obtained. There is a
nondisplaced fracture of the proximal aspect of the fifth
metatarsal. No other fracture. No dislocation. Joint spaces appear
normal. There is a small posterior calcaneal spur.
IMPRESSION: Transversely oriented fracture in the proximal aspect of the fifth
metatarsal with alignment anatomic. No other fracture. No
dislocation. No appreciable joint space narrowing. There is a small
posterior calcaneal spur.

## 2020-04-22 IMAGING — MR MR LUMBAR SPINE WO/W CM
4 of 7 series · 24 of 48 positions shown · IV contrast (gadavist)
Comparison: MRI lumbar spine dated [DATE].

CLINICAL DATA: Worsening low back pain and right leg weakness and
numbness.

EXAM:
MRI LUMBAR SPINE WITHOUT AND WITH CONTRAST
TECHNIQUE: Multiplanar and multiecho pulse sequences of the lumbar spine were
obtained without and with intravenous contrast.
CONTRAST:  6mL GADAVIST GADOBUTROL 1 MMOL/ML IV SOLN

[Series 5: T2 · sagittal · 4.0mm · 0.78mm/px · 5 of 16 slices shown (1 of 2)]
[im 1/16]
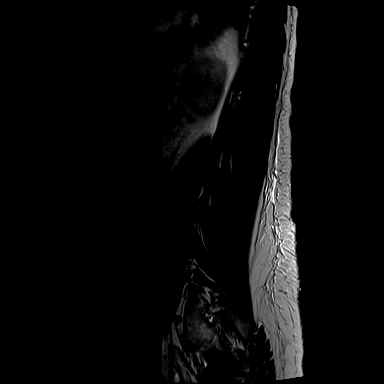
[im 4/16]
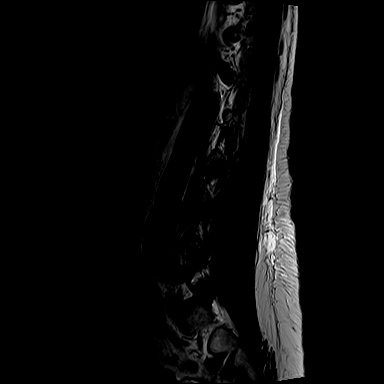
[im 8/16]
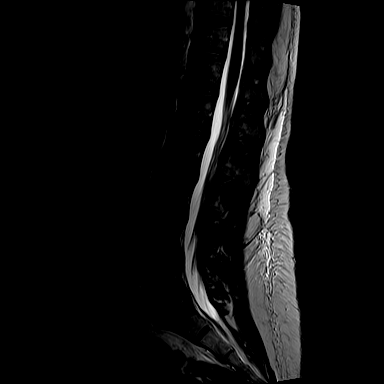
[im 12/16]
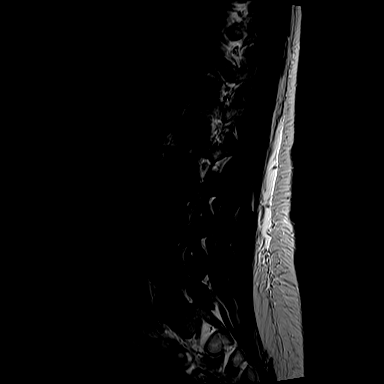
[im 16/16]
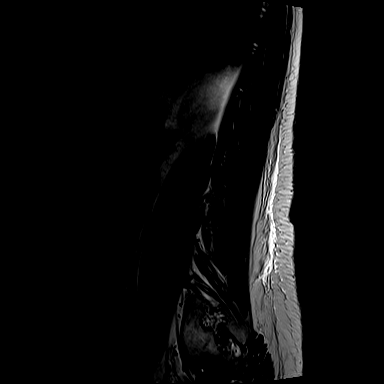

[Series 7: T1 · sagittal · 4.0mm · 0.88mm/px · 4 of 16 slices shown (1 of 2)]
[im 1/16]
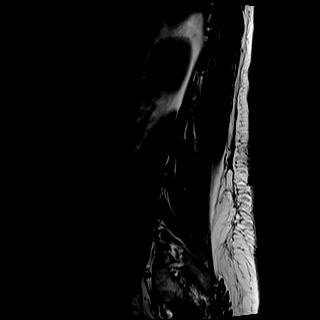
[im 6/16]
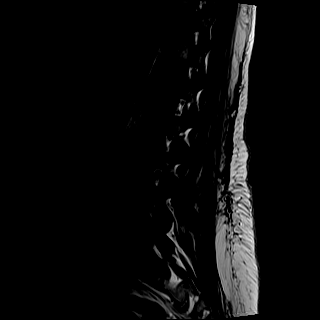
[im 11/16]
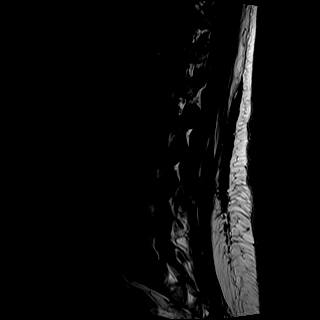
[im 16/16]
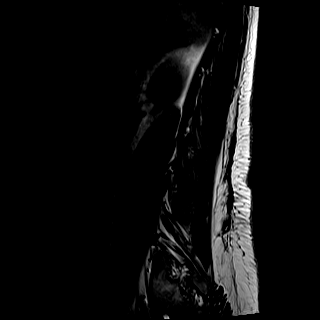

[Series 8: T2 · axial · 4.0mm · 0.57mm/px · z∈[-154,+61]mm · 8 of 36 slices shown (2 of 2)]
[im 1/36]
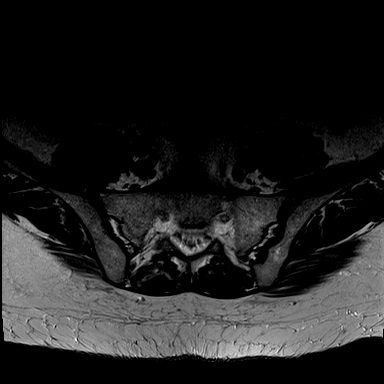
[im 4/36]
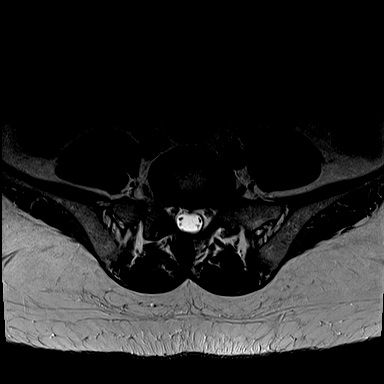
[im 12/36]
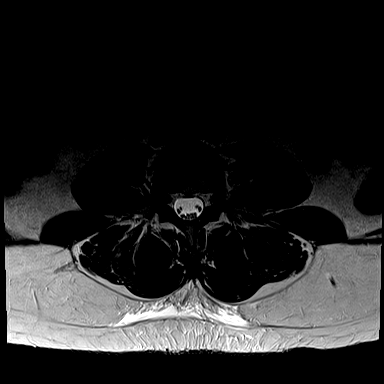
[im 16/36]
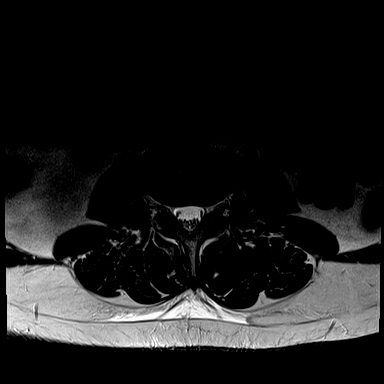
[im 20/36]
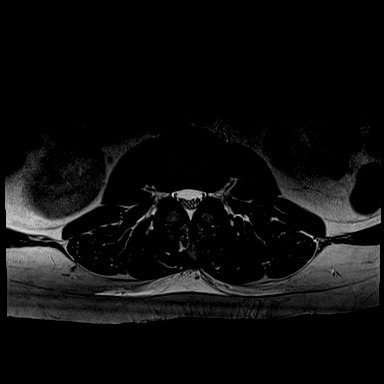
[im 24/36]
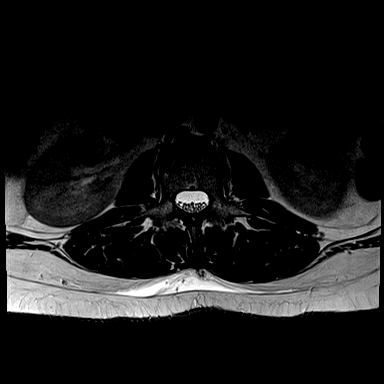
[im 32/36]
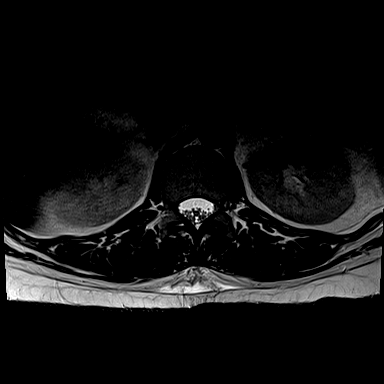
[im 36/36]
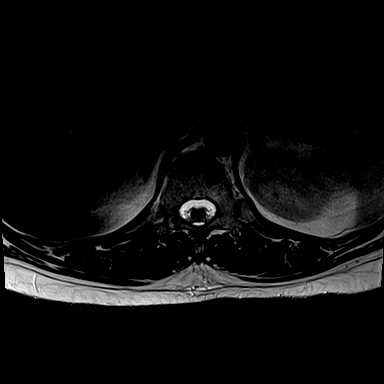

[Series 9: T1 · axial · 4.0mm · 0.34mm/px · z∈[-154,+41]mm · 7 of 36 slices shown (2 of 2)]
[im 1/36]
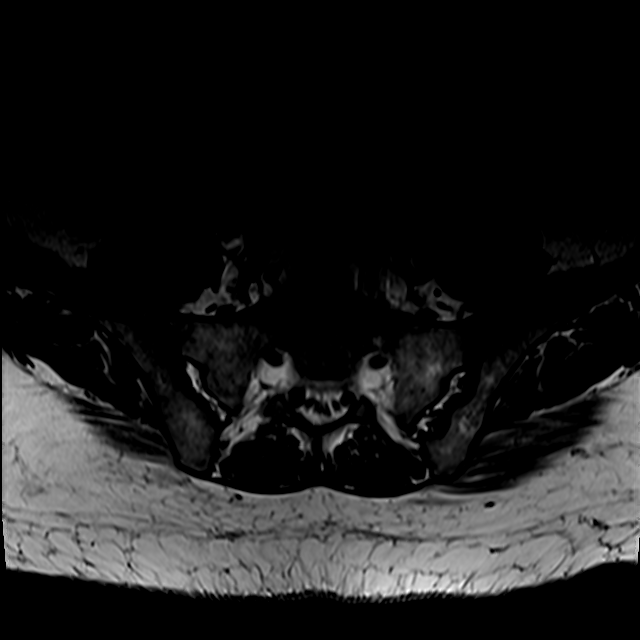
[im 4/36]
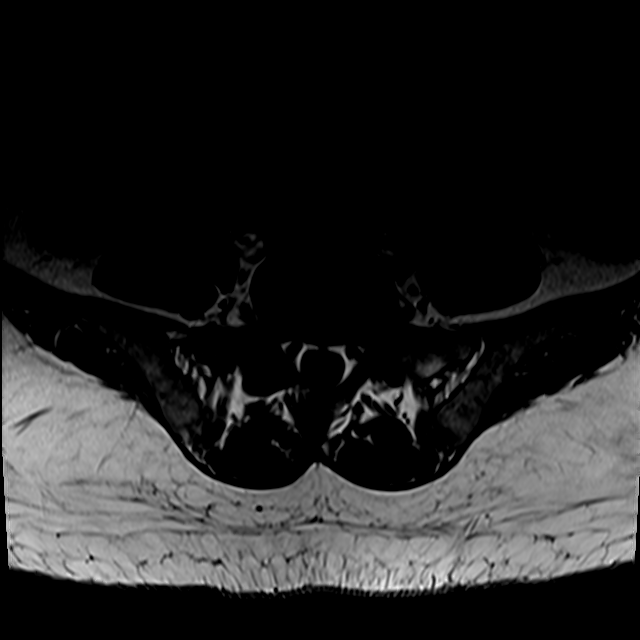
[im 12/36]
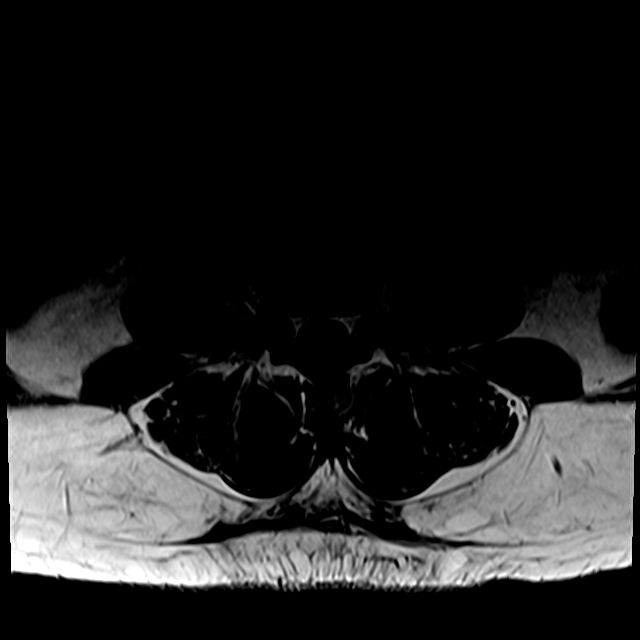
[im 16/36]
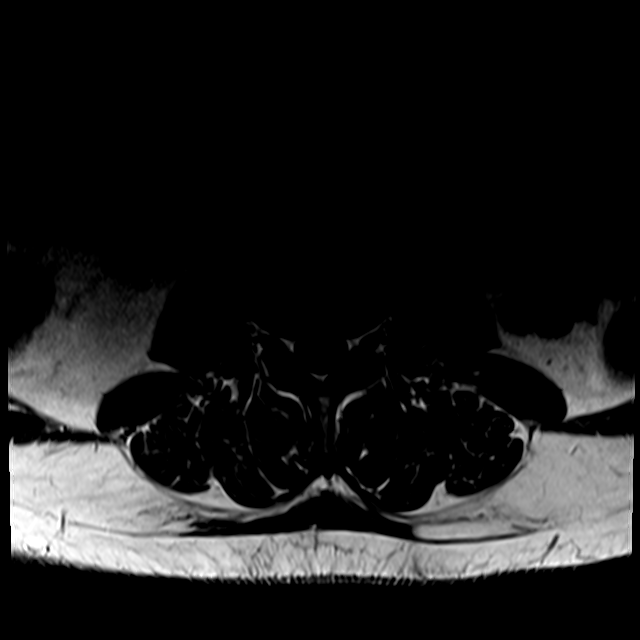
[im 20/36]
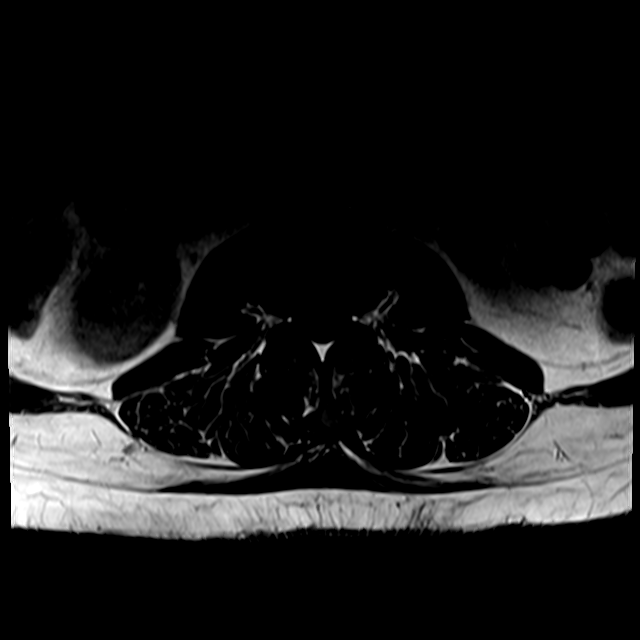
[im 24/36]
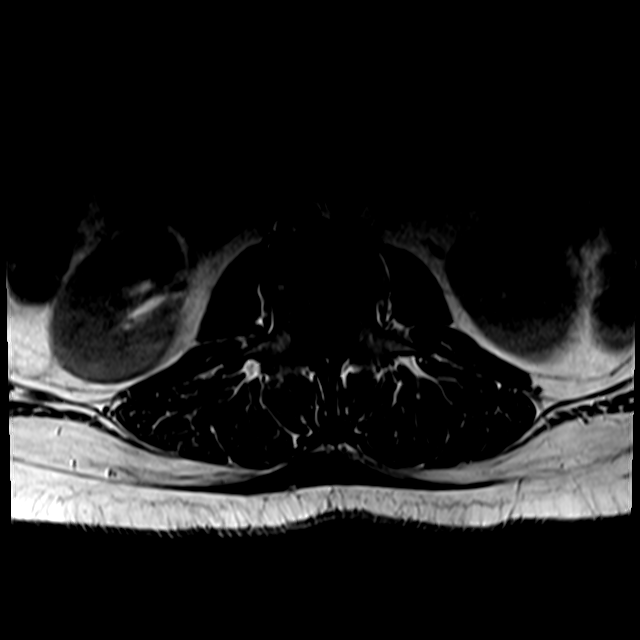
[im 32/36]
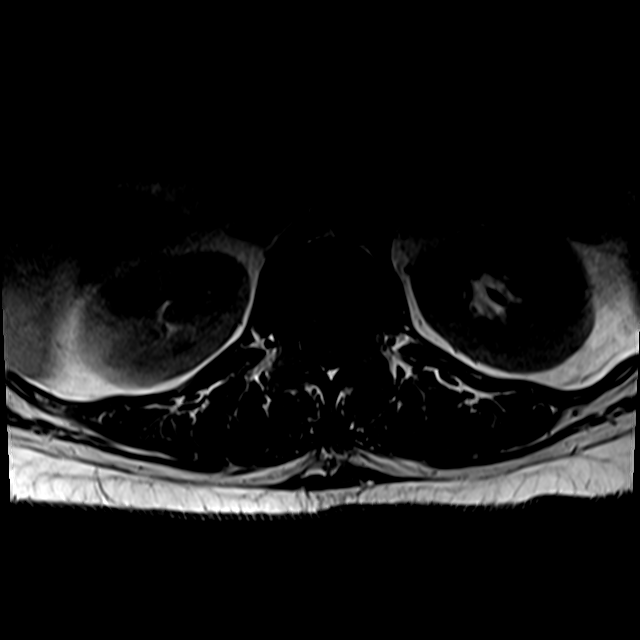

[24 of 48 positions shown; findings below may reference images not displayed]

FINDINGS: Segmentation:  Standard.

Alignment:  Physiologic.

Vertebrae:  No fracture, evidence of discitis, or bone lesion.

Conus medullaris and cauda equina: Conus extends to the L1 level.
Conus and cauda equina appear normal. No intradural enhancement.

Paraspinal and other soft tissues: Right greater than left
paraspinal muscle edema persists (series 6, images 4-6 and 11), but
has significantly improved since the prior study.

Disc levels:

No significant disc bulge or herniation. Unchanged mild bilateral
facet arthropathy at L4-L5 and L5-S1. No spinal canal or
neuroforaminal stenosis at any level.
IMPRESSION: 1. Improving right greater than left paraspinal muscle edema.
2. No acute abnormality or osteomyelitis-discitis.

## 2020-04-22 MED ORDER — HYDROCODONE-ACETAMINOPHEN 5-325 MG PO TABS: 1.0000 | ORAL_TABLET | Freq: Four times a day (QID) | ORAL | 0 refills | Status: DC | PRN

## 2020-04-22 MED ORDER — LEVETIRACETAM 500 MG PO TABS: 500.0000 mg | ORAL_TABLET | Freq: Two times a day (BID) | ORAL | 0 refills | Status: DC

## 2020-04-22 MED ORDER — GADOBUTROL 1 MMOL/ML IV SOLN
6.0000 mL | Freq: Once | INTRAVENOUS | Status: AC | PRN
  Administered 2020-04-22: 6 mL via INTRAVENOUS

## 2020-04-22 MED ORDER — LEVETIRACETAM IN NACL 1000 MG/100ML IV SOLN
1000.0000 mg | Freq: Once | INTRAVENOUS | Status: AC
  Administered 2020-04-22: 1000 mg via INTRAVENOUS
  Filled 2020-04-22: qty 100

## 2020-04-22 NOTE — ED Notes (Signed)
Patient transported to X-ray 

## 2020-04-22 NOTE — ED Notes (Signed)
Pt transported to EEG dept.

## 2020-04-22 NOTE — Procedures (Signed)
Patient Name: Alice Martinez  MRN: [id number]  Epilepsy Attending: Charlsie Quest  Referring Physician/Provider: Dr Chaney Malling Date: 04/22/2020 Duration: 23.27 mins  Patient history: 43yo F with syncope and twitching episodes. EEG to evaluate for seizure  Level of alertness: Awake, asleep  AEDs during EEG study: LEV  Technical aspects: This EEG study was done with scalp electrodes positioned according to the 10-20 International system of electrode placement. Electrical activity was acquired at a sampling rate of 500Hz  and reviewed with a high frequency filter of 70Hz  and a low frequency filter of 1Hz . EEG data were recorded continuously and digitally stored.   Description: The posterior dominant rhythm consists of 8 Hz activity of moderate voltage (25-35 uV) seen predominantly in posterior head regions, symmetric and reactive to eye opening and eye closing. Sleep was characterized by vertex waves, sleep spindles (12 to 14 Hz), maximal frontocentral region. Physiologic photic driving was not seen during photic stimulation.  Hyperventilation was not performed.     IMPRESSION: This study is within normal limits. No seizures or epileptiform discharges were seen throughout the recording.  Alice Martinez 

## 2020-04-22 NOTE — Progress Notes (Signed)
EEG completed, results pending. 
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
## 2020-04-22 NOTE — Progress Notes (Signed)
Right lower extremity venous duplex has been completed. Preliminary results can be found in CV Proc through chart review.  Results were given to the patient's nurse, Idalia Needle.  04/22/20 6:02 PM Olen Cordial RVT

## 2020-04-22 NOTE — Progress Notes (Signed)
Orthopedic Tech Progress Note Patient Details:  JAKYIAH BRIONES 02/24/1976 [id number]  Ortho Devices Type of Ortho Device: Postop shoe/boot,Crutches Ortho Device/Splint Location: RLE Ortho Device/Splint Interventions: Ordered,Application,Adjustment   Post Interventions Patient Tolerated: Well Instructions Provided: Care of device,Adjustment of device,Poper ambulation with device   Dunbar Buras 04/22/2020, 4:48 PM
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
## 2020-04-30 ENCOUNTER — Other Ambulatory Visit: Payer: Self-pay | Admitting: Psychiatry

## 2020-06-05 ENCOUNTER — Encounter (HOSPITAL_COMMUNITY): Payer: Self-pay | Admitting: Behavioral Health

## 2020-06-05 ENCOUNTER — Other Ambulatory Visit: Payer: Self-pay

## 2020-06-05 ENCOUNTER — Ambulatory Visit (HOSPITAL_COMMUNITY)
Admission: EM | Admit: 2020-06-05 | Discharge: 2020-06-06 | Disposition: A | Discharge status: Home / Self Care | Payer: No Payment, Other | Attending: Psychiatry | Admitting: Psychiatry

## 2020-06-05 DIAGNOSIS — R440 Auditory hallucinations: Secondary | ICD-10-CM | POA: Insufficient documentation

## 2020-06-05 DIAGNOSIS — F17210 Nicotine dependence, cigarettes, uncomplicated: Secondary | ICD-10-CM | POA: Insufficient documentation

## 2020-06-05 DIAGNOSIS — Z9151 Personal history of suicidal behavior: Secondary | ICD-10-CM | POA: Insufficient documentation

## 2020-06-05 DIAGNOSIS — F39 Unspecified mood [affective] disorder: Secondary | ICD-10-CM | POA: Insufficient documentation

## 2020-06-05 NOTE — ED Notes (Signed)
Patient in the waiting room, seen by TTS([facility])
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
## 2020-06-06 ENCOUNTER — Inpatient Hospital Stay (HOSPITAL_COMMUNITY)
Admission: AD | Admit: 2020-06-06 | Discharge: 2020-06-09 | DRG: 885 | Disposition: A | Discharge status: Home / Self Care | Payer: Federal, State, Local not specified - Other | Attending: Emergency Medicine | Admitting: Emergency Medicine

## 2020-06-06 ENCOUNTER — Encounter (HOSPITAL_COMMUNITY): Payer: Self-pay | Admitting: Psychiatry

## 2020-06-06 DIAGNOSIS — E7800 Pure hypercholesterolemia, unspecified: Secondary | ICD-10-CM | POA: Diagnosis present

## 2020-06-06 DIAGNOSIS — F17210 Nicotine dependence, cigarettes, uncomplicated: Secondary | ICD-10-CM | POA: Diagnosis present

## 2020-06-06 DIAGNOSIS — F332 Major depressive disorder, recurrent severe without psychotic features: Secondary | ICD-10-CM | POA: Diagnosis present

## 2020-06-06 DIAGNOSIS — F4310 Post-traumatic stress disorder, unspecified: Secondary | ICD-10-CM | POA: Diagnosis present

## 2020-06-06 DIAGNOSIS — Z20822 Contact with and (suspected) exposure to covid-19: Secondary | ICD-10-CM | POA: Diagnosis present

## 2020-06-06 DIAGNOSIS — F419 Anxiety disorder, unspecified: Secondary | ICD-10-CM | POA: Diagnosis present

## 2020-06-06 DIAGNOSIS — I10 Essential (primary) hypertension: Secondary | ICD-10-CM | POA: Diagnosis present

## 2020-06-06 DIAGNOSIS — F411 Generalized anxiety disorder: Secondary | ICD-10-CM | POA: Diagnosis present

## 2020-06-06 DIAGNOSIS — Z9151 Personal history of suicidal behavior: Secondary | ICD-10-CM | POA: Diagnosis not present

## 2020-06-06 DIAGNOSIS — F909 Attention-deficit hyperactivity disorder, unspecified type: Secondary | ICD-10-CM | POA: Diagnosis present

## 2020-06-06 DIAGNOSIS — F39 Unspecified mood [affective] disorder: Secondary | ICD-10-CM | POA: Diagnosis not present

## 2020-06-06 DIAGNOSIS — F1910 Other psychoactive substance abuse, uncomplicated: Secondary | ICD-10-CM | POA: Diagnosis not present

## 2020-06-06 DIAGNOSIS — R440 Auditory hallucinations: Secondary | ICD-10-CM | POA: Diagnosis not present

## 2020-06-06 HISTORY — DX: Major depressive disorder, recurrent severe without psychotic features: F33.2

## 2020-06-06 LAB — PREGNANCY, URINE: Preg Test, Ur: NEGATIVE

## 2020-06-06 LAB — RAPID URINE DRUG SCREEN, HOSP PERFORMED
Amphetamines: NOT DETECTED
Barbiturates: NOT DETECTED
Benzodiazepines: NOT DETECTED
Cocaine: NOT DETECTED
Opiates: NOT DETECTED
Tetrahydrocannabinol: NOT DETECTED

## 2020-06-06 LAB — SARS CORONAVIRUS 2 BY RT PCR (HOSPITAL ORDER, PERFORMED IN ~~LOC~~ HOSPITAL LAB): SARS Coronavirus 2: NEGATIVE

## 2020-06-06 MED ORDER — HYDROCHLOROTHIAZIDE 25 MG PO TABS
25.0000 mg | ORAL_TABLET | Freq: Every day | ORAL | Status: DC
  Administered 2020-06-07 – 2020-06-09 (×3): 25 mg via ORAL
  Filled 2020-06-06 (×4): qty 1

## 2020-06-06 MED ORDER — OLANZAPINE 10 MG PO TBDP: 10.0000 mg | ORAL_TABLET | Freq: Three times a day (TID) | ORAL | Status: DC | PRN

## 2020-06-06 MED ORDER — ALUM & MAG HYDROXIDE-SIMETH 200-200-20 MG/5ML PO SUSP: 30.0000 mL | ORAL | Status: DC | PRN

## 2020-06-06 MED ORDER — MAGNESIUM HYDROXIDE 400 MG/5ML PO SUSP
30.0000 mL | Freq: Every day | ORAL | Status: DC | PRN
  Filled 2020-06-06: qty 30

## 2020-06-06 MED ORDER — ZIPRASIDONE MESYLATE 20 MG IM SOLR: 20.0000 mg | INTRAMUSCULAR | Status: DC | PRN

## 2020-06-06 MED ORDER — LORAZEPAM 1 MG PO TABS: 1.0000 mg | ORAL_TABLET | ORAL | Status: DC | PRN

## 2020-06-06 MED ORDER — HYDROXYZINE HCL 25 MG PO TABS
25.0000 mg | ORAL_TABLET | Freq: Three times a day (TID) | ORAL | Status: DC | PRN
  Administered 2020-06-06 – 2020-06-08 (×5): 25 mg via ORAL
  Filled 2020-06-06 (×6): qty 1

## 2020-06-06 MED ORDER — MAGNESIUM HYDROXIDE 400 MG/5ML PO SUSP
30.0000 mL | Freq: Every day | ORAL | Status: DC | PRN
Start: 2020-06-06 — End: 2020-06-06

## 2020-06-06 MED ORDER — ALUM & MAG HYDROXIDE-SIMETH 200-200-20 MG/5ML PO SUSP
30.0000 mL | ORAL | Status: DC | PRN
  Filled 2020-06-06: qty 30

## 2020-06-06 MED ORDER — ACETAMINOPHEN 325 MG PO TABS: 650.0000 mg | ORAL_TABLET | Freq: Four times a day (QID) | ORAL | Status: DC | PRN

## 2020-06-06 MED ORDER — TRAZODONE HCL 50 MG PO TABS
50.0000 mg | ORAL_TABLET | Freq: Every evening | ORAL | Status: DC | PRN
  Administered 2020-06-06 – 2020-06-08 (×4): 50 mg via ORAL
  Filled 2020-06-06 (×10): qty 1

## 2020-06-06 MED ORDER — ACETAMINOPHEN 325 MG PO TABS
650.0000 mg | ORAL_TABLET | Freq: Four times a day (QID) | ORAL | Status: DC | PRN
  Administered 2020-06-07 – 2020-06-08 (×3): 650 mg via ORAL
  Filled 2020-06-06 (×4): qty 2

## 2020-06-06 NOTE — H&P (Signed)
Behavioral Health Medical Screening Exam  Alice Martinez is an 45 y.o. female.  Total Time spent with patient: 20 minutes  Psychiatric Specialty Exam: Physical Exam Vitals and nursing note reviewed.  Constitutional:      Appearance: Normal appearance.  HENT:     Head: Normocephalic.     Nose: Nose normal.  Pulmonary:     Effort: Pulmonary effort is normal.  Musculoskeletal:        General: Normal range of motion.     Cervical back: Normal range of motion.  Neurological:     General: No focal deficit present.     Mental Status: She is alert and oriented to person, place, and time.  Psychiatric:        Attention and Perception: Attention and perception normal.        Mood and Affect: Mood is anxious and depressed.        Speech: Speech normal.        Behavior: Behavior normal. Behavior is cooperative.        Thought Content: Thought content includes suicidal ideation. Thought content includes suicidal plan.        Cognition and Memory: Cognition and memory normal.        Judgment: Judgment is impulsive.    Review of Systems  Psychiatric/Behavioral: Positive for dysphoric mood and suicidal ideas. The patient is nervous/anxious.   All other systems reviewed and are negative.  There were no vitals taken for this visit.There is no height or weight on file to calculate BMI. General Appearance: Disheveled Eye Contact:  Fair Speech:  Normal Rate Volume:  Normal Mood:  Anxious and Depressed Affect:  Congruent Thought Process:  Coherent and Descriptions of Associations: Intact Orientation:  Full (Time, Place, and Person) Thought Content:  WDL and Logical Suicidal Thoughts:  No Homicidal Thoughts:  No Memory:  Immediate;   Fair Recent;   Fair Remote;   Fair Judgement:  Poor Insight:  Fair Psychomotor Activity:  Decreased Concentration: Concentration: Fair and Attention Span: Fair Recall:  [employer] of Knowledge:Fair Language: Good Akathisia:  No Handed:  Right AIMS  (if indicated):    Assets:  Housing Leisure Time Physical Health Resilience Social Support Sleep:     Musculoskeletal: Strength & Muscle Tone: within normal limits Gait & Station: normal Patient leans: N/A  Vital Signs:  97.5, 111, 20, 142/83, O2 99%  Recommendations: Based on my evaluation the patient does not appear to have an emergency medical condition.  Nanine Means, NP 06/06/2020, 12:10 PM

## 2020-06-06 NOTE — Progress Notes (Signed)
Admission Note: Patient is a 45 year old female who presents as a walk-in for symptoms of depression and medication not being effective.  Patient reports she is overmedicated and medication made her symptoms worse.  Patient presents with a flat affect and depressed mood.  States she's here for medication management and to learn coping skills.  Report relationship with her husband and children as stressors.  Stated she was released from prison nine days ago.   Admission plan of care reviewed and consent for treatment signed.  Skin assessment and personal belongings completed.  Skin is dry and intact.  No contraband found.  Patient oriented to the unit, staff and room.  Verbalizes understanding of unit rules/protocols.  Routine safety checks initiated.  Patient is safe on the unit.

## 2020-06-06 NOTE — BHH Group Notes (Signed)
Psychoeducational Group Note  Date: 06-06-2020 Time: 0900-1000    Goal Setting   Purpose of Group: This group helps to provide patients with the steps of setting a goal that is specific, measurable, attainable, realistic and time specific. A discussion on how we keep ourselves stuck with negative self talk.     Dione Housekeeper

## 2020-06-06 NOTE — BH Assessment (Signed)
Comprehensive Clinical Assessment (CCA) Screening, Triage and Referral Note  06/06/2020 Alice Martinez [id number]   Alice Martinez is an 45 y.o female who presents to [facility] voluntarily for treatment. Per triage information completed pt identified symptoms of SI, guilt, wanting to be alone, anxiety and being unable to get out of bed.   During TTS assessment pt presents alert, tearful, labile, depressed, anxious and oriented x 5, restless but cooperative, and mood-congruent with affect. The pt does not appear to be responding to internal or external stimuli. Neither is the pt presenting with any delusional thinking. Pt verified the information provided in triage paperwork. Pt identified her main complaint to be depression, anxiety and the need for medication. Pt reports an increase in her symptoms identified since the loss of her mother last March. Pt identified the loss of mother/job, family conflict, relationship stressors, legal/personal stressors and lack of communication with her daughter and granddaughter as the triggers to her current symptoms. Pt denies a hx of SI or attempts but appeared to be apprehensive towards admitting current SI as pt reports taking a hand full of Ambien last night to help "turn off her thoughts". Pt states, "I wasn't trying to kill myself I just thought I would feel better if I was no longer here". Pt identified a MH hx of major depression, anxiety, PTSD and bipolar disorder but denies compliance with any medications for over 35 days. Pt reports stopping her previous medications after becoming incarcerated and realizing they were not helpful. Pt reports to dislike the medications she was prescribed by her PCP ([facility]) due to their being to many pills prescribed of high dosages that she believes were not working and made her symptoms worse. Pt denies an INPT hx but reports active OPT with [facility] for individual therapy. Pt denies a family hx of MH/SA. Pt admits to hx of  SA (Heroine) but denies any current SA and identified a current sobriety period of more than 30 days. Pt reports her last time using heroine to be August, 2021 and appeared to be unforthcoming about any other SA. Pt reports to be currently sleeping less than 4 hours a night and denies any struggles eating. Pt denies any current HI/AH/VH but continues to endorse fleeting SI with no plan. Pt reports to need medication and is currently receptive to INPT. Pt is also unable to contract for safety placing her at risk for safety concerns.   Per Alice Means, NP pt meets criteria for INPT   Chief Complaint:  Chief Complaint  Patient presents with  . Depression  . Anxiety   Visit Diagnosis: MDD  Patient Reported Information How did you hear about [place]? Self   Referral name: No data recorded  Referral phone number: No data recorded Whom do you see for routine medical problems? Primary Care   Practice/Facility Name: [facility]   Practice/Facility Phone Number: [phone]   Name of Contact: Alice Martinez   Contact Number: No data recorded  Contact Fax Number: No data recorded  Prescriber Name: No data recorded  Prescriber Address (if known): No data recorded What Is the Reason for Your Visit/Call Today? Depression, anxiety, medicaiton needs & SI  How Long Has This Been Causing You Problems? > than 6 months  Have You Recently Been in Any Inpatient Treatment (Hospital/Detox/Crisis Center/28-Day Program)? No   Name/Location of Program/Hospital:[facility] [address]   How Long Were You There? 2 days   When Were You Discharged? 12/04/2019  Have You  Ever Received Services From [employer] [facility] Before? Yes   Who Do You See at [facility]? ED  Have You Recently Had Any Thoughts About Hurting Yourself? Yes   Are You Planning to Commit Suicide/Harm Yourself At This time?  No  Have you Recently Had Thoughts About Hurting Someone Alice Martinez? No   Explanation: No data recorded Have You Used Any  Alcohol or Drugs in the Past 24 Hours? No   How Long Ago Did You Use Drugs or Alcohol?  No data recorded  What Did You Use and How Much? No data recorded What Do You Feel Would Help You the Most Today? Therapy; Medication  Do You Currently Have a Therapist/Psychiatrist? Yes   Name of Therapist/Psychiatrist: Briana  Martinez ([facility])   Have You Been Recently Discharged From Any Office Practice or Programs? No   Explanation of Discharge From Practice/Program:  No data recorded    CCA Screening Triage Referral Assessment Type of Contact: Face-to-Face   Is this Initial or Reassessment? No data recorded  Date Telepsych consult ordered in [facility]:  No data recorded  Time Telepsych consult ordered in [facility]:  No data recorded Patient Reported Information Reviewed? Yes   Patient Left Without Being Seen? No data recorded  Reason for Not Completing Assessment: No data recorded Collateral Involvement: Alice Martinez ([phone]  Does Patient Have a Court Appointed Legal Guardian? No data recorded  Name and Contact of Legal Guardian:  No data recorded If Minor and Not Living with Parent(s), Who has Custody? n/a  Is CPS involved or ever been involved? Never  Is APS involved or ever been involved? Never  Patient Determined To Be At Risk for Harm To Self or Others Based on Review of Patient Reported Information or Presenting Complaint? No   Method: No data recorded  Availability of Martinez: No data recorded  Intent: No data recorded  Notification Required: No data recorded  Additional Information for Danger to Others Potential:  No data recorded  Additional Comments for Danger to Others Potential:  No data recorded  Are There Guns or Other Weapons in Your Home?  No data recorded   Types of Guns/Weapons: No data recorded   Are These Weapons Safely Secured?                              No data recorded   Who Could Verify You Are Able To Have These Secured:    No data recorded Do You Have  any Outstanding Charges, Pending Court Dates, Parole/Probation? No data recorded Contacted To Inform of Risk of Harm To Self or Others: Other: Comment (Pt reports that she is not going to harm herself or anyone else.)  Location of Assessment: [facility] ED  Does Patient Present under Involuntary Commitment? No   IVC Papers Initial File Date: No data recorded  [place] of Residence: [place]  Patient Currently Receiving the Following Services: Individual Therapy   Determination of Need: Emergent (2 hours)   Options For Referral: Medication Management; Inpatient Hospitalization; Intensive Outpatient Therapy   Opal Sidles, LCSWA

## 2020-06-06 NOTE — Discharge Instructions (Signed)
  Discharge recommendations:  Patient is to take medications as prescribed. Please see information for follow-up appointment with psychiatry and therapy. Please follow up with your primary care provider for all medical related needs.   Therapy: We recommend that patient participate in individual therapy to address mental health concerns.  Medications: The patient is to contact a medical professional and/or outpatient provider to address any new side effects that develop. Patient should update outpatient providers of any new medications and/or medication changes.   Safety:  The patient should abstain from use of illicit substances/drugs and abuse of any medications. If symptoms worsen or do not continue to improve or if the patient becomes actively suicidal or homicidal then it is recommended that the patient return to the closest hospital emergency department, the [facility], or call 911 for further evaluation and treatment. National Suicide Prevention Lifeline 1-800-SUICIDE or 1-800-273-8255.  

## 2020-06-06 NOTE — ED Notes (Signed)
Verbalized understanding of DC instructions.  Left [facility] A&O x 4, no distress accompanied by significant other.

## 2020-06-06 NOTE — Progress Notes (Signed)
[facility] Group Notes:  (Nursing/MHT/Case Management/Adjunct)  Date:  06/06/2020  Time: 2015 Type of Therapy:  wrap up group  Participation Level:  Active  Participation Quality:  Appropriate, Attentive, Sharing and Supportive  Affect:  Depressed  Cognitive:  Alert  Insight:  Improving  Engagement in Group:  Engaged  Modes of Intervention:  Clarification, Education and Support  Summary of Progress/Problems: Positive thinking and self-care were discussed.   Alice Martinez S 06/06/2020, 8:45 PM
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
## 2020-06-06 NOTE — Tx Team (Signed)
Initial Treatment Plan 06/06/2020 3:52 PM SAMANI DEAL FVC:[id number]    PATIENT STRESSORS: Financial difficulties Health problems Marital or family conflict Medication change or noncompliance   PATIENT STRENGTHS: Ability for insight Average or above average intelligence Communication skills Motivation for treatment/growth   PATIENT IDENTIFIED PROBLEMS: "To be happy"  "Social and coping skills"  "I'm being overmedicated"  Depression               DISCHARGE CRITERIA:  Ability to meet basic life and health needs Adequate post-discharge living arrangements Motivation to continue treatment in a less acute level of care  PRELIMINARY DISCHARGE PLAN: Attend aftercare/continuing care group Outpatient therapy Return to previous living arrangement  PATIENT/FAMILY INVOLVEMENT: This treatment plan has been presented to and reviewed with the patient, Alice Martinez.  The patient and family have been given the opportunity to ask questions and make suggestions.  Clarene Critchley, RN 06/06/2020, 3:52 PM

## 2020-06-07 DIAGNOSIS — F1910 Other psychoactive substance abuse, uncomplicated: Secondary | ICD-10-CM | POA: Insufficient documentation

## 2020-06-07 DIAGNOSIS — F332 Major depressive disorder, recurrent severe without psychotic features: Principal | ICD-10-CM

## 2020-06-07 HISTORY — DX: Other psychoactive substance abuse, uncomplicated: F19.10

## 2020-06-07 LAB — LIPID PANEL
Cholesterol: 278 mg/dL — ABNORMAL HIGH (ref 0–200)
HDL: 61 mg/dL (ref >40)
LDL Cholesterol: 174 mg/dL — ABNORMAL HIGH (ref 0–99)
Total CHOL/HDL Ratio: 4.6 RATIO
Triglycerides: 214 mg/dL — ABNORMAL HIGH (ref <150)
VLDL: 43 mg/dL — ABNORMAL HIGH (ref 0–40)

## 2020-06-07 LAB — VITAMIN D 25 HYDROXY (VIT D DEFICIENCY, FRACTURES): Vit D, 25-Hydroxy: 24.57 ng/mL — ABNORMAL LOW (ref 30–100)

## 2020-06-07 LAB — COMPREHENSIVE METABOLIC PANEL
ALT: 29 U/L (ref 0–44)
AST: 28 U/L (ref 15–41)
Albumin: 4.3 g/dL (ref 3.5–5.0)
Alkaline Phosphatase: 61 U/L (ref 38–126)
Anion gap: 13 (ref 5–15)
BUN: 17 mg/dL (ref 6–20)
CO2: 29 mmol/L (ref 22–32)
Calcium: 9.4 mg/dL (ref 8.9–10.3)
Chloride: 96 mmol/L — ABNORMAL LOW (ref 98–111)
Creatinine, Ser: 0.89 mg/dL (ref 0.44–1.00)
GFR, Estimated: >60 mL/min (ref >60)
Glucose, Bld: 147 mg/dL — ABNORMAL HIGH (ref 70–99)
Potassium: 3.5 mmol/L (ref 3.5–5.1)
Sodium: 138 mmol/L (ref 135–145)
Total Bilirubin: 0.7 mg/dL (ref 0.3–1.2)
Total Protein: 7.3 g/dL (ref 6.5–8.1)

## 2020-06-07 LAB — FERRITIN: Ferritin: 8 ng/mL — ABNORMAL LOW (ref 11–307)

## 2020-06-07 LAB — HEMOGLOBIN A1C
Hgb A1c MFr Bld: 5.7 % — ABNORMAL HIGH (ref 4.8–5.6)
Mean Plasma Glucose: 116.89 mg/dL

## 2020-06-07 LAB — CBC
HCT: 45.4 % (ref 36.0–46.0)
Hemoglobin: 14.6 g/dL (ref 12.0–15.0)
MCH: 28.2 pg (ref 26.0–34.0)
MCHC: 32.2 g/dL (ref 30.0–36.0)
MCV: 87.6 fL (ref 80.0–100.0)
Platelets: 383 10*3/uL (ref 150–400)
RBC: 5.18 MIL/uL — ABNORMAL HIGH (ref 3.87–5.11)
RDW: 13.2 % (ref 11.5–15.5)
WBC: 7.3 10*3/uL (ref 4.0–10.5)
nRBC: 0 % (ref 0.0–0.2)

## 2020-06-07 LAB — TSH: TSH: 4.666 u[IU]/mL — ABNORMAL HIGH (ref 0.350–4.500)

## 2020-06-07 MED ORDER — FLUOXETINE HCL 10 MG PO CAPS
10.0000 mg | ORAL_CAPSULE | Freq: Every day | ORAL | Status: DC
  Administered 2020-06-08 – 2020-06-09 (×2): 10 mg via ORAL
  Filled 2020-06-07 (×3): qty 1

## 2020-06-07 MED ORDER — QUETIAPINE FUMARATE ER 50 MG PO TB24
50.0000 mg | ORAL_TABLET | Freq: Every day | ORAL | Status: DC
  Administered 2020-06-07: 50 mg via ORAL
  Filled 2020-06-07 (×3): qty 1

## 2020-06-07 MED ORDER — IBUPROFEN 400 MG PO TABS: 400.0000 mg | ORAL_TABLET | Freq: Four times a day (QID) | ORAL | Status: DC | PRN

## 2020-06-07 MED ORDER — ESCITALOPRAM OXALATE 10 MG PO TABS
10.0000 mg | ORAL_TABLET | Freq: Every day | ORAL | Status: DC
  Filled 2020-06-07 (×2): qty 1

## 2020-06-07 NOTE — Progress Notes (Signed)
°   06/07/20 0648  Vital Signs  Temp (!) 97.4 F (36.3 C)  Temp Source Oral  Pulse Rate 83  Resp 16  BP 117/80  BP Location Left Arm  BP Method Automatic  Patient Position (if appropriate) Standing   D: Patient denies SI/HI/AVH. Patient rated both anxiety and depression 5/10. Patient out in open areas and was social with peers and staff. Pt. Attended group. A:  Patient took scheduled medicine. Pt.'s "period" pain was relieved by 650 mg of Tylenol and anxiety was relieved by 25 mg of Vistaril.  Support and encouragement provided Routine safety checks conducted every 15 minutes. Patient  Informed to notify staff with any concerns.   R:  Safety maintained.
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
## 2020-06-07 NOTE — Progress Notes (Signed)
   06/07/20 0002  COVID-19 Daily Checkoff  Have you had a fever (temp > 37.80C/100F)  in the past 24 hours?  No  If you have had runny nose, nasal congestion, sneezing in the past 24 hours, has it worsened? No  COVID-19 EXPOSURE  Have you traveled outside the state in the past 14 days? No  Have you been in contact with someone with a confirmed diagnosis of COVID-19 or PUI in the past 14 days without wearing appropriate PPE? No  Have you been living in the same home as a person with confirmed diagnosis of COVID-19 or a PUI (household contact)? No  Have you been diagnosed with COVID-19? No

## 2020-06-07 NOTE — Progress Notes (Signed)
[facility] Group Notes:  (Nursing/MHT/Case Management/Adjunct)  Date:  06/07/2020  Time:  2030 Type of Therapy:  wrap up group  Participation Level:  Active  Participation Quality:  Appropriate, Attentive, Sharing and Supportive  Affect:  Depressed  Cognitive:  Appropriate  Insight:  Improving  Engagement in Group:  Engaged  Modes of Intervention:  Clarification, Education and Support  Summary of Progress/Problems: Positive thinking and positive change were discussed.  Alice Martinez 06/07/2020, 8:49 PM

## 2020-06-07 NOTE — [facility] Group Notes (Signed)
[facility] LCSW Group Therapy Note  06/07/2020   10:00-11:00AM  Group Therapy:  Unhealthy versus Healthy Supports, Which Am I?  Participation Level:  Active   Description of Group:  Patients in this group were introduced to the concept that additional supports including self-support are an essential part of recovery.  Initially a discussion was held about the differences between healthy versus unhealthy supports.  Group then talked about adding a variety of healthy supports to address the various needs in patient lives, including becoming more self-supportive.   Two songs entitled "Love Myself" and "My Own Hero" were played.  A group discussion ensued in which patients stated they could relate to the songs which inspired them to be more willing to help themselves in order to succeed, because other people cannot achieve sobriety or stability for them.  Several patients were tearful as they discussed the difficulty in loving oneself.  A song was played called "I Am Enough " toward the end of group and used to conduct an inspirational wrap-up to group of believing in their own self-worth.  They were asked to name one way in which they are willing to work on improving their own self-support at discharge.  Therapeutic Goals: 1)  Highlight the differences between healthy and unhealthy supports 2)  Suggest the importance of being a part of one's own support system 2)  Listen to songs which can be an encouragement on an ongoing basis  3)  Identify ways in which the patient can improve attitudes toward self and willingness to help self   4) Elicit commitments to add healthy supports and to become more conscious of being self-supportive   Summary of Patient Progress:  The patient stated she feels she is a semi-good support for herself, explaining that there are times she can make better choices that make her a positive self-support.  The patient participated thoroughly and positively throughout group and listened  attentively as everyone else spoke.  She remained on-topic and appeared to be invested.  The patient stated that one action she is willing to take at discharge in order to become a "hero"/better self-support is to take the time to figure out what her healthy supports are as well as her unhealthy supports, then to figure out how to increase the healthy ones and decrease the unhealthy ones.  Therapeutic Modalities:   Motivational Interviewing Activity  Lynnell Chad , MSW, LCSW

## 2020-06-07 NOTE — Progress Notes (Signed)
   06/07/20 0004  Psych Admission Type (Psych Patients Only)  Admission Status Voluntary  Psychosocial Assessment  Patient Complaints Depression;Insomnia  Eye Contact Fair  Facial Expression Flat  Affect Appropriate to circumstance  Speech Logical/coherent  Interaction Assertive  Motor Activity Other (Comment) (WDL)  Appearance/Hygiene Unremarkable  Behavior Characteristics Appropriate to situation  Mood Anxious;Pleasant  Thought Process  Coherency WDL  Content WDL  Delusions None reported or observed  Perception WDL  Hallucination None reported or observed  Judgment Impaired  Confusion None  Danger to Self  Current suicidal ideation? Denies  Danger to Others  Danger to Others None reported or observed

## 2020-06-07 NOTE — [facility] Suicide Risk Assessment (Signed)
[facility] INPATIENT:  Family/Significant Other Suicide Prevention Education  Suicide Prevention Education:  Patient Refusal for Family/Significant Other Suicide Prevention Education: The patient Alice Martinez has refused to provide written consent for family/significant other to be provided Family/Significant Other Suicide Prevention Education during admission and/or prior to discharge.  Physician notified.  Darreld Mclean 06/07/2020, 2:58 PM
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
## 2020-06-07 NOTE — [facility] Group Notes (Signed)
Psychoeducational Group Note Late entry for 06/06/2020   Date:06/06/2020 Time: 1300-1400    Life Skills:  A group where two lists are made. What people need and what are things that we do that are healthy. The lists are developed by the patients and it is explained that we often do the actions that are not healthy to get our list of needs met.   Purpose of Group: . The group focus' on teaching patients on how to identify their needs and how to develop the coping skills needed to get their needs met  Participation Level:  Did not attend   Alice Martinez

## 2020-06-07 NOTE — [facility] Group Notes (Signed)
Adult Psychoeducational Group Not Date:  06/07/2020 Time:  3559-7416 Group Topic/Focus: PROGRESSIVE RELAXATION. A group where deep breathing is taught and tensing and relaxation muscle groups is used. Imagery is used as well.  Pts are asked to imagine 3 pillars that hold them up when they are not able to hold themselves up.  Participation Level:  Active  Participation Quality:  Appropriate  Affect:  Appropriate  Cognitive:  Oriented  Insight: Improving  Engagement in Group:  Engaged  Modes of Intervention:  Activity, Discussion, Education, and Support  Additional Comments:  Rates her energy at  An 8 or 9/10. States her grand children and her husband hold her up when she is not able to hold herself up.  Her saying is "one day at a time".  Dione Housekeeper

## 2020-06-07 NOTE — [facility] Group Notes (Signed)
Psychoeducational Group Note  Date: 06-07-20 Time:  1300  Group Topic/Focus:  Making Healthy Choices:   The focus of this group is to help patients identify negative/unhealthy choices they were using prior to admission and identify positive/healthier coping strategies to replace them upon discharge.In this group, patients started asking about the brain and how the brain works with and how the chemicals work for those who use substances, the pros and cons of saboxone.  Participation Level:  Did not attend  Participation Quality:  Appropriate  Affect:  Appropriate  Cognitive:  Oriented  Insight:  Improving  Engagement in Group:  Engaged  Additional Comments:...    Dione Housekeeper

## 2020-06-07 NOTE — H&P (Signed)
Psychiatric Admission Assessment Adult  Patient Identification: Alice Martinez MRN:  [id number] Date of Evaluation:  06/07/2020 Chief Complaint:  Major depressive disorder, recurrent severe without psychotic features (HCC) [F33.2] MDD (major depressive disorder), recurrent severe, without psychosis (HCC) [F33.2] Principal Diagnosis: Major depressive disorder, recurrent severe without psychotic features (HCC) Diagnosis:  Principal Problem:   Major depressive disorder, recurrent severe without psychotic features (HCC) Active Problems:   MDD (major depressive disorder), recurrent severe, without psychosis (HCC)  History of Present Illness: 45 years old Caucasian female was admitted to the unit yesterday evening for severe depression and stating that her medications were not effective.  Patient was seen for admission evaluation this afternoon and she willingly participated in the interview.  Patient was tearful during our interaction.  She reports that she has not been on medications and that has increased her depression and anxiety.  Patient reports that she has not been sleeping and that all night she lays in bed experiencing her mind racing.  She reports that  she has had issues getting her medications due to lack of insurance and financial difficulties.  Patient reports that she was not feeling well in the last two weeks and that her husband brought her to be checked out by mental health provider.  How she landed in the unit is confusing because she was seen and discharged by Melbourne Abts.  She later came to the unit as a walk in and was accepted.  She had informed MR Selena Batten that she was having auditory hallucinations, having conversation with unseen people and was paranoid.  Today she died all.  She denied feeling suicidal.  Patient states she spent 28 days in jail last Months for an accident that the Police said she was under the influence of something.  She also had seizure disorder at the time but CT of  her head was negative then.   Patient reports that has been on medications -Bupropion, Ambien, Xanax and Effexor.  Patient reports that a time she was taking Adderall for ADHD but does not want same at this time.  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  She admits to suicide attempt by OD on Ambien and Heroin twice in December.  Patient states she is able to get her medications now that her disability has been approved with food stamps.  We will start low dose Prozac and Quetiapine.  Patient denies feeling suicidal or having suicidal thoughts.  We will perform daily rounding and review care with members of our interdisciplinary team.  Patient has not been on her Keppra for a month and denies any seizures.  She has an appointment with a Neurologist next Month. Associated Signs/Symptoms: Depression Symptoms:  depressed mood, insomnia, fatigue, difficulty concentrating, impaired memory, anxiety, Duration of Depression Symptoms: Greater than two weeks  (Hypo) Manic Symptoms:  Delusions, Distractibility, Elevated Mood, Hallucinations, Anxiety Symptoms:  Excessive Worry, Psychotic Symptoms:  Hallucinations: Auditory Duration of Psychotic Symptoms: Less than six months  PTSD Symptoms: NA Total Time spent with patient: 45 minutes  Past Psychiatric History: Depression, Anxiety, Suicide attempt by OD x 2 last December 2021  Is the patient at risk to self? No.  Has the patient been a risk to self in the past 6 months? No.  Has the patient been a risk to self within the distant past? No.  Is the patient a risk to others? No.  Has the patient been a risk to others in the past  6 months? No.  Has the patient been a risk to others within the distant past? No.   Prior Inpatient Therapy:   Prior Outpatient Therapy:    Alcohol Screening: 1. How often do you have a drink containing alcohol?: Never 2. How many drinks containing alcohol do you have on a typical  day when you are drinking?: 1 or 2 3. How often do you have six or more drinks on one occasion?: Never AUDIT-C Score: 0 4. How often during the last year have you found that you were not able to stop drinking once you had started?: Never 5. How often during the last year have you failed to do what was normally expected from you because of drinking?: Never 6. How often during the last year have you needed a first drink in the morning to get yourself going after a heavy drinking session?: Never 7. How often during the last year have you had a feeling of guilt of remorse after drinking?: Never 8. How often during the last year have you been unable to remember what happened the night before because you had been drinking?: Never 9. Have you or someone else been injured as a result of your drinking?: No 10. Has a relative or friend or a doctor or another health worker been concerned about your drinking or suggested you cut down?: No Alcohol Use Disorder Identification Test Final Score (AUDIT): 0 Substance Abuse History in the last 12 months:  Yes.   Consequences of Substance Abuse: Medical Consequences:  reports seizure last December which the Police believed was withdrawal seizure Legal Consequences:  was in [place] for 28 days for suspected intoxication. Withdrawal Symptoms:   None Previous Psychotropic Medications: Yes  Psychological Evaluations: No  Past Medical History:  Past Medical History:  Diagnosis Date  . ADHD (attention deficit hyperactivity disorder)   . Depression   . High cholesterol   . Hypertension     Past Surgical History:  Procedure Laterality Date  . TONSILLECTOMY    . TUBAL LIGATION     Family History:  Family History  Family history unknown: Yes   Family Psychiatric  History: Mom Died of Drug OD, Believes multiple Family members suffers from Mental illness but none seeks help Tobacco Screening:   Social History:  Social History   Substance and Sexual Activity   Alcohol Use Not Currently     Social History   Substance and Sexual Activity  Drug Use No    Additional Social History: Marital status: Married Number of Years Married:  (unknown) What types of issues is patient dealing with in the relationship?: Pt reports feeling her husband is controlling and obsessive with her Are you sexually active?: Yes What is your sexual orientation?: Heterosexual Does patient have children?: Yes How many children?: 3 How is patient's relationship with their children?: Pt report some strain but decent    Pain Medications: SEE MAR Prescriptions: SEE MAR Over the Counter: SEE MAR History of alcohol / drug use?: Yes Longest period of sobriety (when/how long): Pt reports current sobriety for over 35 days Negative Consequences of Use: Legal,Personal relationships Withdrawal Symptoms:  (None reported) Name of Substance 1: Alcohol 1 - Last Use / Amount: Pt reports that she has not used alchol in years Name of Substance 2: Benzo 2 - Last Use / Amount: Pt reports that she have not used in 35 days Name of Substance 3: Heroine 3 - Last Use / Amount: Pt reports that she has not used  since August, 2021              Allergies:  Not on File Lab Results:  Results for orders placed or performed during the hospital encounter of 06/06/20 (from the past 48 hour(s))  SARS Coronavirus 2 by RT PCR (hospital order, performed in [facility] hospital lab) Nasopharyngeal Nasopharyngeal Swab     Status: None   Collection Time: 06/06/20 12:55 PM   Specimen: Nasopharyngeal Swab  Result Value Ref Range   SARS Coronavirus 2 NEGATIVE NEGATIVE    Comment: (NOTE) SARS-CoV-2 target nucleic acids are NOT DETECTED.  The SARS-CoV-2 RNA is generally detectable in upper and lower respiratory specimens during the acute phase of infection. The lowest concentration of SARS-CoV-2 viral copies this assay can detect is 250 copies / mL. A negative result does not preclude SARS-CoV-2  infection and should not be used as the sole basis for treatment or other patient management decisions.  A negative result may occur with improper specimen collection / handling, submission of specimen other than nasopharyngeal swab, presence of viral mutation(s) within the areas targeted by this assay, and inadequate number of viral copies (<250 copies / mL). A negative result must be combined with clinical observations, patient history, and epidemiological information.  Fact Sheet for Patients:   BoilerBrush.com.cy  Fact Sheet for Healthcare Providers: https://pope.com/  This test is not yet approved or  cleared by the [place] FDA and has been authorized for detection and/or diagnosis of SARS-CoV-2 by FDA under an Emergency Use Authorization (EUA).  This EUA will remain in effect (meaning this test can be used) for the duration of the COVID-19 declaration under Section 564(b)(1) of the Act, 21 U.S.C. section 360bbb-3(b)(1), unless the authorization is terminated or revoked sooner.  Performed at [facility], [address], [place], [place] [address]   Pregnancy, urine     Status: None   Collection Time: 06/06/20  6:00 PM  Result Value Ref Range   Preg Test, Ur NEGATIVE NEGATIVE    Comment:        THE SENSITIVITY OF THIS METHODOLOGY IS >20 mIU/mL. Performed at [facility], [address] [place], [place] [address]   Urine rapid drug screen (hosp performed)not at [facility]     Status: None   Collection Time: 06/06/20  6:00 PM  Result Value Ref Range   Opiates NONE DETECTED NONE DETECTED   Cocaine NONE DETECTED NONE DETECTED   Benzodiazepines NONE DETECTED NONE DETECTED   Amphetamines NONE DETECTED NONE DETECTED   Tetrahydrocannabinol NONE DETECTED NONE DETECTED   Barbiturates NONE DETECTED NONE DETECTED    Comment: (NOTE) DRUG SCREEN FOR MEDICAL PURPOSES ONLY.  IF CONFIRMATION IS  NEEDED FOR ANY PURPOSE, NOTIFY LAB WITHIN 5 DAYS.  LOWEST DETECTABLE LIMITS FOR URINE DRUG SCREEN Drug Class                     Cutoff (ng/mL) Amphetamine and metabolites    1000 Barbiturate and metabolites    200 Benzodiazepine                 200 Tricyclics and metabolites     300 Opiates and metabolites        300 Cocaine and metabolites        300 THC                            50 Performed at [facility], [address] [place], [place] [address]   CBC     Status: Abnormal   Collection Time: 06/07/20  7:28 AM  Result Value Ref Range   WBC 7.3 4.0 - 10.5 K/uL   RBC 5.18 (H) 3.87 - 5.11 MIL/uL   Hemoglobin 14.6 12.0 - 15.0 g/dL   HCT 79.8 92.1 - 19.4 %   MCV 87.6 80.0 - 100.0 fL   MCH 28.2 26.0 - 34.0 pg   MCHC 32.2 30.0 - 36.0 g/dL   RDW 17.4 08.1 - 44.8 %   Platelets 383 150 - 400 K/uL   nRBC 0.0 0.0 - 0.2 %    Comment: Performed at [facility], [address] [place], [place] [address]  Comprehensive metabolic panel     Status: Abnormal   Collection Time: 06/07/20  7:28 AM  Result Value Ref Range   Sodium 138 135 - 145 mmol/L   Potassium 3.5 3.5 - 5.1 mmol/L   Chloride 96 (L) 98 - 111 mmol/L   CO2 29 22 - 32 mmol/L   Glucose, Bld 147 (H) 70 - 99 mg/dL    Comment: Glucose reference range applies only to samples taken after fasting for at least 8 hours.   BUN 17 6 - 20 mg/dL   Creatinine, Ser 1.49 0.44 - 1.00 mg/dL   Calcium 9.4 8.9 - 70.2 mg/dL   Total Protein 7.3 6.5 - 8.1 g/dL   Albumin 4.3 3.5 - 5.0 g/dL   AST 28 15 - 41 U/L   ALT 29 0 - 44 U/L   Alkaline Phosphatase 61 38 - 126 U/L   Total Bilirubin 0.7 0.3 - 1.2 mg/dL   GFR, Estimated >63 >78 mL/min    Comment: (NOTE) Calculated using the CKD-EPI Creatinine Equation (2021)    Anion gap 13 5 - 15    Comment: Performed at [facility], [address] [place], [place] [address]  TSH     Status: Abnormal   Collection Time: 06/07/20  7:28 AM   Result Value Ref Range   TSH 4.666 (H) 0.350 - 4.500 uIU/mL    Comment: Performed by a 3rd Generation assay with a functional sensitivity of <=0.01 uIU/mL. Performed at [facility], [address] [place], [place] [address]   VITAMIN D 25 Hydroxy (Vit-D Deficiency, Fractures)     Status: Abnormal   Collection Time: 06/07/20  7:28 AM  Result Value Ref Range   Vit D, 25-Hydroxy 24.57 (L) 30 - 100 ng/mL    Comment: (NOTE) Vitamin D deficiency has been defined by the Institute of Medicine  and an Endocrine Society practice guideline as a level of serum 25-OH  vitamin D less than 20 ng/mL (1,2). The Endocrine Society went on to  further define vitamin D insufficiency as a level between 21 and 29  ng/mL (2).  1. IOM (Institute of Medicine). 2010. Dietary reference intakes for  calcium and D. [place]: [employer]. 2. Holick MF, Binkley [place], Bischoff-Ferrari HA, et al. Evaluation,  treatment, and prevention of vitamin D deficiency: an Endocrine  Society clinical practice guideline, JCEM. 2011 Jul; 96(7): 1911-30.  Performed at [facility], [address], [place], [place] [address]   Lipid panel     Status: Abnormal   Collection Time: 06/07/20  7:28 AM  Result Value Ref Range   Cholesterol 278 (H) 0 - 200 mg/dL   Triglycerides 767 (H) <150 mg/dL  HDL 61 >40 mg/dL   Total CHOL/HDL Ratio 4.6 RATIO   VLDL 43 (H) 0 - 40 mg/dL   LDL Cholesterol 409174 (H) 0 - 99 mg/dL    Comment:        Total Cholesterol/HDL:CHD Risk Coronary Heart Disease Risk Table                     Men   Women  1/2 Average Risk   3.4   3.3  Average Risk       5.0   4.4  2 X Average Risk   9.6   7.1  3 X Average Risk  23.4   11.0        Use the calculated Patient Ratio above and the CHD Risk Table to determine the patient's CHD Risk.        ATP III CLASSIFICATION (LDL):  <100     mg/dL   Optimal  [id number]  mg/dL   Near or Above                    Optimal   130-159  mg/dL   Borderline  [id number]  mg/dL   High  >213>190     mg/dL   Very High Performed at [facility], [address] [place], [place] [address]   Hemoglobin A1c     Status: Abnormal   Collection Time: 06/07/20  7:28 AM  Result Value Ref Range   Hgb A1c MFr Bld 5.7 (H) 4.8 - 5.6 %    Comment: (NOTE) Pre diabetes:          5.7%-6.4%  Diabetes:              >6.4%  Glycemic control for   <7.0% adults with diabetes    Mean Plasma Glucose 116.89 mg/dL    Comment: Performed at [facility], [address] [place], [place] [address]  Ferritin     Status: Abnormal   Collection Time: 06/07/20  7:28 AM  Result Value Ref Range   Ferritin 8 (L) 11 - 307 ng/mL    Comment: Performed at [facility] [place] [facility], [address] [place], [place] [address]    Blood Alcohol level:  Lab Results  Component Value Date   ETH <10 04/15/2020    Metabolic Disorder Labs:  Lab Results  Component Value Date   HGBA1C 5.7 (H) 06/07/2020   MPG 116.89 06/07/2020   No results found for: PROLACTIN Lab Results  Component Value Date   CHOL 278 (H) 06/07/2020   TRIG 214 (H) 06/07/2020   HDL 61 06/07/2020   CHOLHDL 4.6 06/07/2020   VLDL 43 (H) 06/07/2020   LDLCALC 174 (H) 06/07/2020    Current Medications: Current Facility-Administered Medications  Medication Dose Route Frequency Provider Last Rate Last Admin  . acetaminophen (TYLENOL) tablet 650 mg  650 mg Oral Q6H PRN Charm RingsLord, Jamison Y, NP   650 mg at 06/07/20 0735  . alum & mag hydroxide-simeth (MAALOX/MYLANTA) 200-200-20 MG/5ML suspension 30 mL  30 mL Oral Q4H PRN Charm RingsLord, Jamison Y, NP      . escitalopram (LEXAPRO) tablet 10 mg  10 mg Oral Daily Yazir Koerber C, NP      . hydrochlorothiazide (HYDRODIURIL) tablet 25 mg  25 mg Oral Daily Mariel CraftMaurer, Sheila M, MD   25 mg at 06/07/20 [id number]  . hydrOXYzine (ATARAX/VISTARIL) tablet 25 mg  25 mg Oral TID PRN Mariel CraftMaurer, Sheila M, MD   25  mg at 06/07/20 0735  . OLANZapine  zydis (ZYPREXA) disintegrating tablet 10 mg  10 mg Oral Q8H PRN Mariel Craft, MD       And  . LORazepam (ATIVAN) tablet 1 mg  1 mg Oral PRN Mariel Craft, MD       And  . ziprasidone (GEODON) injection 20 mg  20 mg Intramuscular PRN Mariel Craft, MD      . magnesium hydroxide (MILK OF MAGNESIA) suspension 30 mL  30 mL Oral Daily PRN Charm Rings, NP      . QUEtiapine (SEROQUEL XR) 24 hr tablet 50 mg  50 mg Oral QHS Carlton Buskey C, NP      . traZODone (DESYREL) tablet 50 mg  50 mg Oral QHS,MR X 1 Mariel Craft, MD   50 mg at 06/06/20 2110   PTA Medications: Medications Prior to Admission  Medication Sig Dispense Refill Last Dose  . ALPRAZolam (XANAX) 0.5 MG tablet Take 0.5 mg by mouth 3 (three) times daily as needed for anxiety. (Patient not taking: Reported on 06/06/2020)   Not Taking at Unknown time  . buPROPion (WELLBUTRIN SR) 150 MG 12 hr tablet Take 150 mg by mouth See admin instructions. Take 150 mg by mouth in the morning and increase to 150 mg two times a day after fully weaned off of Effexor XR (Venlafaxine) (Patient not taking: Reported on 06/06/2020)   Not Taking at Unknown time  . hydrochlorothiazide (HYDRODIURIL) 25 MG tablet Take 25 mg by mouth daily.     Marland Kitchen HYDROcodone-acetaminophen (NORCO/VICODIN) 5-325 MG tablet Take 1 tablet by mouth every 6 (six) hours as needed. (Patient not taking: Reported on 06/06/2020) 8 tablet 0 Not Taking at Unknown time  . levETIRAcetam (KEPPRA) 500 MG tablet Take 1 tablet (500 mg total) by mouth 2 (two) times daily. (Patient not taking: Reported on 06/06/2020) 120 tablet 0 Not Taking at Unknown time  . Multiple Vitamin (MULTIVITAMIN WITH MINERALS) TABS tablet Take 1 tablet by mouth daily. (Patient not taking: Reported on 06/06/2020)   Not Taking at Unknown time  . naproxen (NAPROSYN) 500 MG tablet Take 1 tablet (500 mg total) by mouth 2 (two) times daily as needed for mild pain, moderate pain or headache (TAKE WITH MEALS.). (Patient not taking:  No sig reported) 20 tablet 0 Not Taking at Unknown time  . potassium chloride SA (KLOR-CON) 20 MEQ tablet Take 1 tablet (20 mEq total) by mouth 2 (two) times daily for 4 days. (Patient not taking: Reported on 04/22/2020) 8 tablet 0   . venlafaxine XR (EFFEXOR-XR) 37.5 MG 24 hr capsule Take 37.5 mg by mouth See admin instructions. Take 37.5 mg by mouth in the morning and an additional 37.5 mg daily as needed for withdrawal symptoms from this medication (Patient not taking: Reported on 06/06/2020)   Not Taking at Unknown time  . zolpidem (AMBIEN) 10 MG tablet Take 10 mg by mouth daily as needed for sleep. (Patient not taking: Reported on 06/06/2020)   Not Taking at Unknown time    Musculoskeletal: Strength & Muscle Tone: within normal limits Gait & Station: normal Patient leans: Front  Psychiatric Specialty Exam: Physical Exam Vitals and nursing note reviewed.  Constitutional:      Appearance: Normal appearance.  HENT:     Head: Normocephalic.     Nose: Nose normal.  Cardiovascular:     Rate and Rhythm: Normal rate and regular rhythm.     Pulses: Normal pulses.  Pulmonary:  Effort: Pulmonary effort is normal.  Musculoskeletal:        General: Normal range of motion.     Cervical back: Normal range of motion.  Skin:    General: Skin is warm.  Neurological:     General: No focal deficit present.     Mental Status: She is alert.     Review of Systems  Constitutional: Negative.   HENT: Negative.   Eyes: Negative.   Respiratory: Negative.   Cardiovascular: Negative.   Gastrointestinal: Negative.   Endocrine: Negative.   Genitourinary: Negative.   Musculoskeletal: Negative.   Allergic/Immunologic: Negative.   Hematological: Negative.   Psychiatric/Behavioral: Positive for sleep disturbance. The patient is nervous/anxious.        Racing thought-thought never shots down    Blood pressure 117/80, pulse 83, temperature (!) 97.4 F (36.3 C), temperature source Oral, resp. rate 16,  height 5\' 1"  (1.549 m), weight 64.4 kg, SpO2 100 %.Body mass index is 26.83 kg/m.  General Appearance: Fairly Groomed and Neat  Eye Contact:  Good  Speech:  Clear and Coherent and Normal Rate  Volume:  Normal  Mood:  Anxious and Depressed  Affect:  Congruent and Depressed  Thought Process:  Coherent  Orientation:  Full (Time, Place, and Person)  Thought Content:  Logical  Suicidal Thoughts:  No  Homicidal Thoughts:  No  Memory:  Immediate;   Good Recent;   Good Remote;   Good  Judgement:  Fair  Insight:  Good  Psychomotor Activity:  Normal  Concentration:  Concentration: Fair  Recall:  Fair  Fund of Knowledge:  Good  Language:  Good  Akathisia:  No  Handed:  Right  AIMS (if indicated):     Assets:  Communication Skills Desire for Improvement Housing Intimacy Physical Health Resilience Social Support  ADL's:  Intact  Cognition:  WNL  Sleep:  Number of Hours: 6.5    Treatment Plan Summary: Daily contact with patient to assess and evaluate symptoms and progress in treatment and Medication management  -Start Quetiapine XL 50 mg po at bed time for mood -Start Prozac 10 mg po daily for depression -Offer Trazodone 50 mg as needed for sleep -Start Hydrochlorothiazide 25 mg po daily for Depression. -Encourage group participation.  Observation Level/Precautions:  15 minute checks  Laboratory:  CBC Chemistry Profile HbAIC-slightly abnormal results.  UDS-Negative, Elevated Cholesterol and Triglycerides, LDL, VLDL TSH-Elevated-4.666, will obtain Free T 4 in am  Psychotherapy:  Encouraged Group  participatIion  Medications:  See MAR  Consultations:  NA  Discharge Concerns:  Financial-?Ability to afford medications.  Estimated LOS: 5-7 days  Other:     Physician Treatment Plan for Primary Diagnosis: Major depressive disorder, recurrent severe without psychotic features (HCC) Long Term Goal(s): Improvement in symptoms so as ready for discharge  Short Term Goals: Ability  to identify changes in lifestyle to reduce recurrence of condition will improve, Ability to verbalize feelings will improve, Ability to disclose and discuss suicidal ideas, Ability to demonstrate self-control will improve, Ability to identify and develop effective coping behaviors will improve, Ability to maintain clinical measurements within normal limits will improve, Compliance with prescribed medications will improve and Ability to identify triggers associated with substance abuse/mental health issues will improve  Physician Treatment Plan for Secondary Diagnosis: Principal Problem:   Major depressive disorder, recurrent severe without psychotic features (HCC) Active Problems:   MDD (major depressive disorder), recurrent severe, without psychosis (HCC)  Long Term Goal(s): Improvement in symptoms so as ready for discharge  Short Term Goals: Ability to identify changes in lifestyle to reduce recurrence of condition will improve, Ability to verbalize feelings will improve, Ability to disclose and discuss suicidal ideas, Ability to demonstrate self-control will improve, Ability to identify and develop effective coping behaviors will improve, Ability to maintain clinical measurements within normal limits will improve, Compliance with prescribed medications will improve and Ability to identify triggers associated with substance abuse/mental health issues will improve  I certify that inpatient services furnished can reasonably be expected to improve the patient's condition.    Earney Navy, NP 2/6/20221:41 PM

## 2020-06-08 LAB — T4, FREE: Free T4: 0.73 ng/dL (ref 0.61–1.12)

## 2020-06-08 MED ORDER — PRAVASTATIN SODIUM 10 MG PO TABS
20.0000 mg | ORAL_TABLET | Freq: Every day | ORAL | Status: DC
  Administered 2020-06-08: 20 mg via ORAL
  Filled 2020-06-08: qty 2
  Filled 2020-06-08: qty 1

## 2020-06-08 MED ORDER — HYDROXYZINE HCL 25 MG PO TABS
25.0000 mg | ORAL_TABLET | Freq: Three times a day (TID) | ORAL | Status: DC | PRN
  Administered 2020-06-08: 25 mg via ORAL
  Filled 2020-06-08 (×2): qty 1
  Filled 2020-06-08: qty 7

## 2020-06-08 MED ORDER — QUETIAPINE FUMARATE 50 MG PO TABS
50.0000 mg | ORAL_TABLET | Freq: Every day | ORAL | Status: DC
  Administered 2020-06-08: 50 mg via ORAL
  Filled 2020-06-08 (×2): qty 1

## 2020-06-08 MED ORDER — VITAMIN D (ERGOCALCIFEROL) 1.25 MG (50000 UNIT) PO CAPS
50000.0000 [IU] | ORAL_CAPSULE | ORAL | Status: DC
  Administered 2020-06-08: 50000 [IU] via ORAL
  Filled 2020-06-08: qty 1

## 2020-06-08 NOTE — Progress Notes (Signed)
[facility] MD Progress Note  06/08/2020 3:15 PM RANJIT ROZYCKI  MRN:  [id number]   Subjective:  Alice Martinez is a 45 y.o. female who was initially admitted for inpatient psychiatric hospitalization on 06/06/2020 for management of worsening depression. The patient is currently on Hospital Day 2.   Chart Review from last 24 hours:  The patient's chart was reviewed and nursing notes were reviewed. The patient's case was discussed in multidisciplinary team meeting. Per [facility]   Information Obtained Today During Patient Interview: The patient was seen and evaluated on the unit. On assessment today the patient reports that she sought voluntary treatment for help in managing her depression. She reports that a year ago last 08/12/22 her mother died, and in 12-12-2022 of last year, her daughter had a baby with a man who is wanted for robbery. She states that her daughter is "on the run" with this man which has caused the patient distress. In addition, she states she got involved with a man last year who was dealing drugs and reports that she got charged along with him for possession with intent to sell because the hotel room he was arrested in was in her name. She went to jail for a month for these charges and just got out 9 days ago. While in jail, she states she was taken off her medications.   She reports that her PCP had been trying to manage her mood symptoms all last year in the context of her various psychosocial stressors with multiple antidepressant trials including Effexor and Wellbutrin. She states she has a remote h/o cocaine use with last use "sometime last year" as well as a h/o snorting heroin with last use in December 2021. When questioned about bipolar symptoms, she states in 08-12-2022 of last year she had a 1-2 day episode of decreased need for sleep, increased desire to stay up and clean her home, increased spending and gambling, and increased sexual behaviors. She states this episode did not occur in the context of  drug use. Since that time, she states she has had more difficulty with depressive episodes with associated increased appetite, insomnia, irritability, ruminations, poor focus, and fatigue. When questioned about possible AH and delusions prior to admission, she states that her husband told her she was acting strangely but this was in the context of being awakened from sleep while on Ambien. She denies any h/o AVH, paranoia, ideas of reference, or first rank symptoms. She states she was previously diagnosed with seizures, questionably while on Wellbutrin, but states she has been off Keppra since being in jail with last seizure in December 2021. She has a neurology appointment in 2022/08/12 and is refusing restart of Keppra during this admission.   Today she reports she feels more mood stable and questions how long she will be in the hospital. She denies medication side-effects with start of Prozac and Seroquel. She denies cravings for drugs. She denies SI, HI, AVH, paranoia, or delusions. She reports improved sleep overnight and stable appetite since admission. She voices no physical complaints. Time was spent discussing her lab results and providing psycho-education about her medications and differential diagnosis.  Principal Problem: Major depressive disorder, recurrent severe without psychotic features (HCC) Diagnosis: Principal Problem:   Major depressive disorder, recurrent severe without psychotic features (HCC)  Total Time Spent in Direct Patient Care:  I personally spent 30 minutes on the unit in direct patient care. The direct patient care time included face-to-face time with the patient, reviewing the  patient's chart, communicating with other professionals, and coordinating care. Greater than 50% of this time was spent in counseling or coordinating care with the patient regarding goals of hospitalization, psycho-education, and discharge planning needs.  Past Psychiatric History: see admission  H&P  Past Medical History:  Past Medical History:  Diagnosis Date  . ADHD (attention deficit hyperactivity disorder)   . Depression   . High cholesterol   . Hypertension     Past Surgical History:  Procedure Laterality Date  . TONSILLECTOMY    . TUBAL LIGATION     Family History:  Family History  Family history unknown: Yes   Family Psychiatric  History: see admission H&P  Social History:  Social History   Substance and Sexual Activity  Alcohol Use Not Currently     Social History   Substance and Sexual Activity  Drug Use No    Social History   Socioeconomic History  . Marital status: Divorced    Spouse name: Not on file  . Number of children: Not on file  . Years of education: Not on file  . Highest education level: Not on file  Occupational History  . Not on file  Tobacco Use  . Smoking status: Current Every Day Smoker    Packs/day: 0.50    Types: Cigarettes  . Smokeless tobacco: Never Used  Substance and Sexual Activity  . Alcohol use: Not Currently  . Drug use: No  . Sexual activity: Yes    Birth control/protection: Surgical  Other Topics Concern  . Not on file  Social History Narrative  . Not on file   Social Determinants of Health   Financial Resource Strain: Not on file  Food Insecurity: Not on file  Transportation Needs: Not on file  Physical Activity: Not on file  Stress: Not on file  Social Connections: Not on file   Additional Social History:    Pain Medications: SEE MAR Prescriptions: SEE MAR Over the Counter: SEE MAR History of alcohol / drug use?: Yes Longest period of sobriety (when/how long): Pt reports current sobriety for over 35 days Negative Consequences of Use: Legal,Personal relationships Withdrawal Symptoms:  (None reported) Name of Substance 1: Alcohol 1 - Last Use / Amount: Pt reports that she has not used alchol in years Name of Substance 2: Benzo 2 - Last Use / Amount: Pt reports that she have not used in 35  days Name of Substance 3: Heroine 3 - Last Use / Amount: Pt reports that she has not used since August, 2021  Sleep: Good  Appetite:  Good  Current Medications: Current Facility-Administered Medications  Medication Dose Route Frequency Provider Last Rate Last Admin  . acetaminophen (TYLENOL) tablet 650 mg  650 mg Oral Q6H PRN Charm Rings, NP   650 mg at 06/08/20 4270  . alum & mag hydroxide-simeth (MAALOX/MYLANTA) 200-200-20 MG/5ML suspension 30 mL  30 mL Oral Q4H PRN Charm Rings, NP      . FLUoxetine (PROZAC) capsule 10 mg  10 mg Oral Daily Dahlia Byes C, NP   10 mg at 06/08/20 0741  . hydrochlorothiazide (HYDRODIURIL) tablet 25 mg  25 mg Oral Daily Mariel Craft, MD   25 mg at 06/08/20 0741  . hydrOXYzine (ATARAX/VISTARIL) tablet 25 mg  25 mg Oral Q8H PRN Mason Jim, Dewan Emond E, MD      . ibuprofen (ADVIL) tablet 400 mg  400 mg Oral Q6H PRN Dahlia Byes C, NP      . OLANZapine zydis (ZYPREXA)  disintegrating tablet 10 mg  10 mg Oral Q8H PRN Mariel Craft, MD       And  . LORazepam (ATIVAN) tablet 1 mg  1 mg Oral PRN Mariel Craft, MD       And  . ziprasidone (GEODON) injection 20 mg  20 mg Intramuscular PRN Mariel Craft, MD      . magnesium hydroxide (MILK OF MAGNESIA) suspension 30 mL  30 mL Oral Daily PRN Charm Rings, NP      . pravastatin (PRAVACHOL) tablet 20 mg  20 mg Oral q1800 Xylan Sheils E, MD      . QUEtiapine (SEROQUEL) tablet 50 mg  50 mg Oral QHS Mason Jim, Dyron Kawano E, MD      . traZODone (DESYREL) tablet 50 mg  50 mg Oral QHS,MR X 1 Mariel Craft, MD   50 mg at 06/07/20 2208  . Vitamin D (Ergocalciferol) (DRISDOL) capsule 50,000 Units  50,000 Units Oral Q7 days Comer Locket, MD       Lab Results:  Results for orders placed or performed during the hospital encounter of 06/06/20 (from the past 48 hour(s))  Pregnancy, urine     Status: None   Collection Time: 06/06/20  6:00 PM  Result Value Ref Range   Preg Test, Ur NEGATIVE NEGATIVE     Comment:        THE SENSITIVITY OF THIS METHODOLOGY IS >20 mIU/mL. Performed at [facility], [address], [place], [place] [address]   Urine rapid drug screen (hosp performed)not at [facility]     Status: None   Collection Time: 06/06/20  6:00 PM  Result Value Ref Range   Opiates NONE DETECTED NONE DETECTED   Cocaine NONE DETECTED NONE DETECTED   Benzodiazepines NONE DETECTED NONE DETECTED   Amphetamines NONE DETECTED NONE DETECTED   Tetrahydrocannabinol NONE DETECTED NONE DETECTED   Barbiturates NONE DETECTED NONE DETECTED    Comment: (NOTE) DRUG SCREEN FOR MEDICAL PURPOSES ONLY.  IF CONFIRMATION IS NEEDED FOR ANY PURPOSE, NOTIFY LAB WITHIN 5 DAYS.  LOWEST DETECTABLE LIMITS FOR URINE DRUG SCREEN Drug Class                     Cutoff (ng/mL) Amphetamine and metabolites    1000 Barbiturate and metabolites    200 Benzodiazepine                 200 Tricyclics and metabolites     300 Opiates and metabolites        300 Cocaine and metabolites        300 THC                            50 Performed at [facility], [address] [place], [place] [address]   CBC     Status: Abnormal   Collection Time: 06/07/20  7:28 AM  Result Value Ref Range   WBC 7.3 4.0 - 10.5 K/uL   RBC 5.18 (H) 3.87 - 5.11 MIL/uL   Hemoglobin 14.6 12.0 - 15.0 g/dL   HCT 88.8 91.6 - 94.5 %   MCV 87.6 80.0 - 100.0 fL   MCH 28.2 26.0 - 34.0 pg   MCHC 32.2 30.0 - 36.0 g/dL   RDW 03.8 88.2 - 80.0 %   Platelets 383 150 - 400 K/uL   nRBC 0.0 0.0 - 0.2 %    Comment: Performed at  [facility], [address], [place], [place] [address]  Comprehensive metabolic panel     Status: Abnormal   Collection Time: 06/07/20  7:28 AM  Result Value Ref Range   Sodium 138 135 - 145 mmol/L   Potassium 3.5 3.5 - 5.1 mmol/L   Chloride 96 (L) 98 - 111 mmol/L   CO2 29 22 - 32 mmol/L   Glucose, Bld 147 (H) 70 - 99 mg/dL    Comment: Glucose reference range applies only  to samples taken after fasting for at least 8 hours.   BUN 17 6 - 20 mg/dL   Creatinine, Ser 6.04 0.44 - 1.00 mg/dL   Calcium 9.4 8.9 - 54.0 mg/dL   Total Protein 7.3 6.5 - 8.1 g/dL   Albumin 4.3 3.5 - 5.0 g/dL   AST 28 15 - 41 U/L   ALT 29 0 - 44 U/L   Alkaline Phosphatase 61 38 - 126 U/L   Total Bilirubin 0.7 0.3 - 1.2 mg/dL   GFR, Estimated >98 >11 mL/min    Comment: (NOTE) Calculated using the CKD-EPI Creatinine Equation (2021)    Anion gap 13 5 - 15    Comment: Performed at [facility], [address], [place], [place] [address]  TSH     Status: Abnormal   Collection Time: 06/07/20  7:28 AM  Result Value Ref Range   TSH 4.666 (H) 0.350 - 4.500 uIU/mL    Comment: Performed by a 3rd Generation assay with a functional sensitivity of <=0.01 uIU/mL. Performed at [facility], [address] [place], [place] [address]   VITAMIN D 25 Hydroxy (Vit-D Deficiency, Fractures)     Status: Abnormal   Collection Time: 06/07/20  7:28 AM  Result Value Ref Range   Vit D, 25-Hydroxy 24.57 (L) 30 - 100 ng/mL    Comment: (NOTE) Vitamin D deficiency has been defined by the Institute of Medicine  and an Endocrine Society practice guideline as a level of serum 25-OH  vitamin D less than 20 ng/mL (1,2). The Endocrine Society went on to  further define vitamin D insufficiency as a level between 21 and 29  ng/mL (2).  1. IOM (Institute of Medicine). 2010. Dietary reference intakes for  calcium and D. [place]: [employer]. 2. Holick MF, Binkley Duchesne, Bischoff-Ferrari HA, et al. Evaluation,  treatment, and prevention of vitamin D deficiency: an Endocrine  Society clinical practice guideline, JCEM. 2011 Jul; 96(7): 1911-30.  Performed at [facility], [address], [place], [place] [address]   Lipid panel     Status: Abnormal   Collection Time: 06/07/20  7:28 AM  Result Value Ref Range   Cholesterol 278 (H) 0 - 200 mg/dL    Triglycerides 578 (H) <150 mg/dL   HDL 61 >46 mg/dL   Total CHOL/HDL Ratio 4.6 RATIO   VLDL 43 (H) 0 - 40 mg/dL   LDL Cholesterol 962 (H) 0 - 99 mg/dL    Comment:        Total Cholesterol/HDL:CHD Risk Coronary Heart Disease Risk Table                     Men   Women  1/2 Average Risk   3.4   3.3  Average Risk       5.0   4.4  2 X Average Risk   9.6   7.1  3 X Average Risk  23.4   11.0  Use the calculated Patient Ratio above and the CHD Risk Table to determine the patient's CHD Risk.        ATP III CLASSIFICATION (LDL):  <100     mg/dL   Optimal  [id number]  mg/dL   Near or Above                    Optimal  130-159  mg/dL   Borderline  [id number]  mg/dL   High  >811>190     mg/dL   Very High Performed at [facility], [address] [place], [place] [address]   Hemoglobin A1c     Status: Abnormal   Collection Time: 06/07/20  7:28 AM  Result Value Ref Range   Hgb A1c MFr Bld 5.7 (H) 4.8 - 5.6 %    Comment: (NOTE) Pre diabetes:          5.7%-6.4%  Diabetes:              >6.4%  Glycemic control for   <7.0% adults with diabetes    Mean Plasma Glucose 116.89 mg/dL    Comment: Performed at [facility], [address] [place], [place] [address]  Ferritin     Status: Abnormal   Collection Time: 06/07/20  7:28 AM  Result Value Ref Range   Ferritin 8 (L) 11 - 307 ng/mL    Comment: Performed at [facility], [address] [place], [place] [address]  T4, free     Status: None   Collection Time: 06/08/20  6:36 AM  Result Value Ref Range   Free T4 0.73 0.61 - 1.12 ng/dL    Comment: (NOTE) Biotin ingestion may interfere with free T4 tests. If the results are inconsistent with the TSH level, previous test results, or the clinical presentation, then consider biotin interference. If needed, order repeat testing after stopping biotin. Performed at [facility], [address] [place], [place] [address]     Blood Alcohol  level:  Lab Results  Component Value Date   ETH <10 04/15/2020   Metabolic Disorder Labs: Lab Results  Component Value Date   HGBA1C 5.7 (H) 06/07/2020   MPG 116.89 06/07/2020   No results found for: PROLACTIN Lab Results  Component Value Date   CHOL 278 (H) 06/07/2020   TRIG 214 (H) 06/07/2020   HDL 61 06/07/2020   CHOLHDL 4.6 06/07/2020   VLDL 43 (H) 06/07/2020   LDLCALC 174 (H) 06/07/2020   Physical Findings: AIMS: Facial and Oral Movements Muscles of Facial Expression: None, normal Lips and Perioral Area: None, normal Jaw: None, normal Tongue: None, normal,Extremity Movements Upper (arms, wrists, hands, fingers): None, normal Lower (legs, knees, ankles, toes): None, normal, Trunk Movements Neck, shoulders, hips: None, normal, Overall Severity Severity of abnormal movements (highest score from questions above): None, normal Incapacitation due to abnormal movements: None, normal Patient's awareness of abnormal movements (rate only patient's report): No Awareness, Dental Status Current problems with teeth and/or dentures?: No Does patient usually wear dentures?: No   Musculoskeletal: Strength & Muscle Tone: within normal limits Gait & Station: normal. steady Patient leans: N/A  Psychiatric Specialty Exam: Physical Exam Vitals reviewed.  HENT:     Head: Normocephalic.  Pulmonary:     Effort: Pulmonary effort is normal.  Neurological:     Mental Status: She is alert.     Review of Systems  Constitutional: Negative for fever.  Respiratory: Negative for shortness of breath.  Cardiovascular: Negative for chest pain.  Gastrointestinal: Negative for diarrhea, nausea and vomiting.  Neurological: Negative for headaches.    Blood pressure (!) 111/91, pulse (!) 108, temperature (!) 97.4 F (36.3 C), temperature source Oral, resp. rate 16, height 5\' 1"  (1.549 m), weight 64.4 kg, SpO2 100 %.Body mass index is 26.83 kg/m.  General Appearance: casually dressed,  adequate hygiene, appears stated age  Eye Contact:  Good  Speech:  Clear and Coherent and Normal Rate  Volume:  Normal  Mood:  Anxious and Dysphoric  Affect:  Constricted  Thought Process:  Goal Directed but circumstantial  Orientation:  Full (Time, Place, and Person)  Thought Content:  Denies AVH, delusions, paranoia, ideas of reference, or first rank symptoms; no evidence of acute psychosis or paranoia on exam; no obsessions/compulsions  Suicidal Thoughts:  Denied  Homicidal Thoughts:  Denied  Memory:  Immediate;   Good  Judgement:  Fair  Insight:  Fair  Psychomotor Activity:  Normal, no restlessness or tremor  Concentration:  Concentration: Fair and Attention Span: Fair  Recall:  of Knowledge:  Good  Language:  Good  Akathisia:  Negative  Assets:  Communication Skills Desire for Improvement Housing Resilience  ADL's:  Intact  Cognition:  WNL  Sleep:  Number of Hours: 4.75   Treatment Plan Summary: ASSESSMENT: Diagnoses / Active Problems: MDD recurrent, severe without psychotic features (r/o MDD with mixed features; r/o bipolar II) 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 Safety and Monitoring:  -- Continued voluntary admission to inpatient psychiatric unit for safety, stabilization and treatment  -- Daily contact with patient to assess and evaluate symptoms and progress in treatment  -- Patient's case to be discussed in multi-disciplinary team meeting  -- Observation Level : q15 minute checks  -- Vital signs:  q12 hours  -- Precautions: suicide  2. Psychiatric Diagnoses and Treatment:   MDD recurrent - severe  -- Continue Prozac 10mg  qam for depression and titrate up as tolerated during admission - discussed the r/b/se/a of this medication with the patient and she consents to continued medication trial - advised that if she has an underlying bipolar spectrum illness that use of an antidepressant could  put her at risk for mood escalation and she agrees to symptom surveillance -- Change to Seroquel immediate release 50mg  po qhs for mood instability - the r/b/se/a to this med including risk of developing TD/EPS, weight gain, DM, and high cholesterol on an atypical antipsychotic were discussed and she consents to ongoing medication trial -- Continue Trazodone 50mg  po qhs PRN insomnia -- Continue Vistaril 25mg  q8 hours PRN anxiety  -- Metabolic profile and EKG monitoring obtained while on an atypical antipsychotic (BMI:26.83 Lipid Panel:cholesterol 278, HDL 61, LDL 174, triglycerides 214; HbgA1c:5.7 QTc:435)   -- Encouraged patient to participate in unit milieu and in scheduled group therapies   -- Would benefit from CBT/DBT after discharge  -- Short Term Goals: Ability to identify changes in lifestyle to reduce recurrence of condition will improve and Ability to verbalize feelings will improve  -- Long Term Goals: Improvement in symptoms so as ready for discharge   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 Medical Issues Being Addressed:   H/o seizures off medications  --  placed on seizure precautions and reviewed with pharmacist that she last filled Keppra 500mg  bid in December 2021 - discussed with patient and she refuses restart of Keppra during this admission and states she has Neurology appointment in March for f/u which she was encouraged to keep after discharge    HTN   -- Continue HCTZ 25mg  daily (home med) and monitor BP (SBP today 111-128 and DBP 65-91)  -- Recheck BMP tomorrow for electrolyte monitoring on diuretic   Low Ferritin (8)  -- Check iron and TIBC; H/H 14.6/45.4   Vitamin D Deficiency  -- Start Vitamin D 50,000 IU weekly   Elevated Glucose on admission (147)  -- HbgA1c 5.7   R/o subclinical  hypothyroidism  -- TSH 4.666 and Free T4 normal at 0.73 - will need PCP to recheck thyroid labs within 4-6 weeks as an outpatient which was discussed with the patient   Hyperlipidemia (cholesterol 278, LDL 174, triglycerides 214)  -- Verified with pharmacist her home med for lipids and restarted Pravastatin 20mg  daily  -- Will need PCP outpatient f/u to recheck lipids while on Seroquel; encouraged low fat, high fiber diet and increased exercise   4. Discharge Planning:   -- Social work and case management to assist with discharge planning and identification of hospital follow-up needs prior to discharge  -- Estimated LOS: 2-3 days  -- Discharge Concerns: Need to establish a safety plan; Medication compliance and effectiveness  -- Discharge Goals: Return home with outpatient referrals for mental health follow-up including medication management/psychotherapy  Comer Locket, MD, FAPA 06/08/2020, 3:15 PM

## 2020-06-08 NOTE — [facility] Group Notes (Signed)
[facility] LCSW Group Therapy  06/08/2020 1:36 PM  Type of Therapy:  Group Therapy: Stress Management  Participation Level:  Active  Summary of Progress/Problems:Pt received a packet of group materials. Pt shared during introductions that her children stress her out. Pt shared that when she is stressed she tends to sleep and isolate from others but that her grandchildren make her happy.   Alice Martinez 06/08/2020, 1:36 PM

## 2020-06-08 NOTE — [facility] Group Notes (Signed)
Occupational Therapy Group Note Date: 06/08/2020 Group Topic/Focus: Self-Esteem  Group Description: Group encouraged increased engagement and participation through discussion and activity focused on self-esteem. Patients explored and discussed the differences between healthy and low self-esteem and how it affects our daily lives and occupations with a focus on relationships, work, school, self-care, and personal leisure interests. Group discussion then transitioned into identifying specific strategies to boost self-esteem and engaged in a collaborative and independent activity looking at positive ways to describe oneself A-Z.   Therapeutic Goal(s): Understand and recognize the differences between healthy and low self-esteem Identify healthy strategies to improve/build self-esteem Participation Level: Patient joined group late and was present for a brief period of time before being pulled to meet with MD. Pt active in discussion for duration ~5 minutes attended.     Plan: Continue to engage patient in OT groups 2 - 3x/week.  06/08/2020  Donne Hazel, MOT, OTR/L

## 2020-06-08 NOTE — Tx Team (Signed)
Interdisciplinary Treatment and Diagnostic Plan Update  06/08/2020 Time of Session: 9:25am Alice Martinez MRN: [id number]  Principal Diagnosis: Major depressive disorder, recurrent severe without psychotic features (HCC)  Secondary Diagnoses: Principal Problem:   Major depressive disorder, recurrent severe without psychotic features (HCC) Active Problems:   GAD (generalized anxiety disorder)   Insomnia secondary to anxiety   PTSD (post-traumatic stress disorder)   MDD (major depressive disorder), recurrent severe, without psychosis (HCC)   Polysubstance abuse (HCC)   Current Medications:  Current Facility-Administered Medications  Medication Dose Route Frequency Provider Last Rate Last Admin  . acetaminophen (TYLENOL) tablet 650 mg  650 mg Oral Q6H PRN Charm Rings, NP   650 mg at 06/08/20 5885  . alum & mag hydroxide-simeth (MAALOX/MYLANTA) 200-200-20 MG/5ML suspension 30 mL  30 mL Oral Q4H PRN Charm Rings, NP      . FLUoxetine (PROZAC) capsule 10 mg  10 mg Oral Daily Dahlia Byes C, NP   10 mg at 06/08/20 0741  . hydrochlorothiazide (HYDRODIURIL) tablet 25 mg  25 mg Oral Daily Mariel Craft, MD   25 mg at 06/08/20 0741  . hydrOXYzine (ATARAX/VISTARIL) tablet 25 mg  25 mg Oral TID PRN Mariel Craft, MD   25 mg at 06/08/20 0277  . ibuprofen (ADVIL) tablet 400 mg  400 mg Oral Q6H PRN Dahlia Byes C, NP      . OLANZapine zydis (ZYPREXA) disintegrating tablet 10 mg  10 mg Oral Q8H PRN Mariel Craft, MD       And  . LORazepam (ATIVAN) tablet 1 mg  1 mg Oral PRN Mariel Craft, MD       And  . ziprasidone (GEODON) injection 20 mg  20 mg Intramuscular PRN Mariel Craft, MD      . magnesium hydroxide (MILK OF MAGNESIA) suspension 30 mL  30 mL Oral Daily PRN Charm Rings, NP      . QUEtiapine (SEROQUEL XR) 24 hr tablet 50 mg  50 mg Oral QHS Dahlia Byes C, NP   50 mg at 06/07/20 2108  . traZODone (DESYREL) tablet 50 mg  50 mg Oral QHS,MR X 1 Mariel Craft, MD   50 mg at 06/07/20 2208   PTA Medications: Medications Prior to Admission  Medication Sig Dispense Refill Last Dose  . ALPRAZolam (XANAX) 0.5 MG tablet Take 0.5 mg by mouth 3 (three) times daily as needed for anxiety. (Patient not taking: Reported on 06/06/2020)   Not Taking at Unknown time  . buPROPion (WELLBUTRIN SR) 150 MG 12 hr tablet Take 150 mg by mouth See admin instructions. Take 150 mg by mouth in the morning and increase to 150 mg two times a day after fully weaned off of Effexor XR (Venlafaxine) (Patient not taking: Reported on 06/06/2020)   Not Taking at Unknown time  . hydrochlorothiazide (HYDRODIURIL) 25 MG tablet Take 25 mg by mouth daily.     Marland Kitchen HYDROcodone-acetaminophen (NORCO/VICODIN) 5-325 MG tablet Take 1 tablet by mouth every 6 (six) hours as needed. (Patient not taking: Reported on 06/06/2020) 8 tablet 0 Not Taking at Unknown time  . levETIRAcetam (KEPPRA) 500 MG tablet Take 1 tablet (500 mg total) by mouth 2 (two) times daily. (Patient not taking: Reported on 06/06/2020) 120 tablet 0 Not Taking at Unknown time  . Multiple Vitamin (MULTIVITAMIN WITH MINERALS) TABS tablet Take 1 tablet by mouth daily. (Patient not taking: Reported on 06/06/2020)   Not Taking at Unknown time  .  naproxen (NAPROSYN) 500 MG tablet Take 1 tablet (500 mg total) by mouth 2 (two) times daily as needed for mild pain, moderate pain or headache (TAKE WITH MEALS.). (Patient not taking: No sig reported) 20 tablet 0 Not Taking at Unknown time  . potassium chloride SA (KLOR-CON) 20 MEQ tablet Take 1 tablet (20 mEq total) by mouth 2 (two) times daily for 4 days. (Patient not taking: Reported on 04/22/2020) 8 tablet 0   . venlafaxine XR (EFFEXOR-XR) 37.5 MG 24 hr capsule Take 37.5 mg by mouth See admin instructions. Take 37.5 mg by mouth in the morning and an additional 37.5 mg daily as needed for withdrawal symptoms from this medication (Patient not taking: Reported on 06/06/2020)   Not Taking at Unknown time  .  zolpidem (AMBIEN) 10 MG tablet Take 10 mg by mouth daily as needed for sleep. (Patient not taking: Reported on 06/06/2020)   Not Taking at Unknown time    Patient Stressors: Financial difficulties Health problems Marital or family conflict Medication change or noncompliance  Patient Strengths: Ability for insight Average or above average intelligence Communication skills Motivation for treatment/growth  Treatment Modalities: Medication Management, Group therapy, Case management,  1 to 1 session with clinician, Psychoeducation, Recreational therapy.   Physician Treatment Plan for Primary Diagnosis: Major depressive disorder, recurrent severe without psychotic features (HCC) Long Term Goal(s): Improvement in symptoms so as ready for discharge Improvement in symptoms so as ready for discharge   Short Term Goals: Ability to identify changes in lifestyle to reduce recurrence of condition will improve Ability to verbalize feelings will improve Ability to disclose and discuss suicidal ideas Ability to demonstrate self-control will improve Ability to identify and develop effective coping behaviors will improve Ability to maintain clinical measurements within normal limits will improve Compliance with prescribed medications will improve Ability to identify triggers associated with substance abuse/mental health issues will improve Ability to identify changes in lifestyle to reduce recurrence of condition will improve Ability to verbalize feelings will improve Ability to disclose and discuss suicidal ideas Ability to demonstrate self-control will improve Ability to identify and develop effective coping behaviors will improve Ability to maintain clinical measurements within normal limits will improve Compliance with prescribed medications will improve Ability to identify triggers associated with substance abuse/mental health issues will improve  Medication Management: Evaluate patient's  response, side effects, and tolerance of medication regimen.  Therapeutic Interventions: 1 to 1 sessions, Unit Group sessions and Medication administration.  Evaluation of Outcomes: Progressing  Physician Treatment Plan for Secondary Diagnosis: Principal Problem:   Major depressive disorder, recurrent severe without psychotic features (HCC) Active Problems:   GAD (generalized anxiety disorder)   Insomnia secondary to anxiety   PTSD (post-traumatic stress disorder)   MDD (major depressive disorder), recurrent severe, without psychosis (HCC)   Polysubstance abuse (HCC)  Long Term Goal(s): Improvement in symptoms so as ready for discharge Improvement in symptoms so as ready for discharge   Short Term Goals: Ability to identify changes in lifestyle to reduce recurrence of condition will improve Ability to verbalize feelings will improve Ability to disclose and discuss suicidal ideas Ability to demonstrate self-control will improve Ability to identify and develop effective coping behaviors will improve Ability to maintain clinical measurements within normal limits will improve Compliance with prescribed medications will improve Ability to identify triggers associated with substance abuse/mental health issues will improve Ability to identify changes in lifestyle to reduce recurrence of condition will improve Ability to verbalize feelings will improve Ability to disclose and  discuss suicidal ideas Ability to demonstrate self-control will improve Ability to identify and develop effective coping behaviors will improve Ability to maintain clinical measurements within normal limits will improve Compliance with prescribed medications will improve Ability to identify triggers associated with substance abuse/mental health issues will improve     Medication Management: Evaluate patient's response, side effects, and tolerance of medication regimen.  Therapeutic Interventions: 1 to 1 sessions,  Unit Group sessions and Medication administration.  Evaluation of Outcomes: Progressing   RN Treatment Plan for Primary Diagnosis: Major depressive disorder, recurrent severe without psychotic features (HCC) Long Term Goal(s): Knowledge of disease and therapeutic regimen to maintain health will improve  Short Term Goals: Ability to demonstrate self-control, Ability to participate in decision making will improve and Ability to verbalize feelings will improve  Medication Management: RN will administer medications as ordered by provider, will assess and evaluate patient's response and provide education to patient for prescribed medication. RN will report any adverse and/or side effects to prescribing provider.  Therapeutic Interventions: 1 on 1 counseling sessions, Psychoeducation, Medication administration, Evaluate responses to treatment, Monitor vital signs and CBGs as ordered, Perform/monitor CIWA, COWS, AIMS and Fall Risk screenings as ordered, Perform wound care treatments as ordered.  Evaluation of Outcomes: Progressing   LCSW Treatment Plan for Primary Diagnosis: Major depressive disorder, recurrent severe without psychotic features (HCC) Long Term Goal(s): Safe transition to appropriate next level of care at discharge, Engage patient in therapeutic group addressing interpersonal concerns.  Short Term Goals: Engage patient in aftercare planning with referrals and resources, Increase social support and Increase ability to appropriately verbalize feelings  Therapeutic Interventions: Assess for all discharge needs, 1 to 1 time with Social worker, Explore available resources and support systems, Assess for adequacy in community support network, Educate family and significant other(s) on suicide prevention, Complete Psychosocial Assessment, Interpersonal group therapy.  Evaluation of Outcomes: Progressing   Progress in Treatment: Attending groups: Yes. Participating in groups:  Yes. Taking medication as prescribed: Yes. Toleration medication: Yes. Family/Significant other contact made: No, will contact:  pt declined consents Patient understands diagnosis: Yes. Discussing patient identified problems/goals with staff: Yes. Medical problems stabilized or resolved: Yes. Denies suicidal/homicidal ideation: Yes. Issues/concerns per patient self-inventory: No. Other: None  New problem(s) identified: No, Describe:  CSW will continue to assess  New Short Term/Long Term Goal(s):medication stabilization, elimination of SI thoughts, development of comprehensive mental wellness plan.  Patient Goals:  "to stay positive"  Discharge Plan or Barriers: Patient recently admitted. CSW will continue to follow and assess for appropriate referrals and possible discharge planning.  Reason for Continuation of Hospitalization: Depression Medication stabilization Suicidal ideation  Estimated Length of Stay: 3-5 days  Attendees: Patient: Alice Martinez 06/08/2020   Physician: Dr. Cindi Carbon 06/08/2020   Nursing:  06/08/2020   RN Care Manager: 06/08/2020   Social Worker: Fredirick Lathe, LCSWA 06/08/2020   Recreational Therapist:  06/08/2020   Other:  06/08/2020   Other:  06/08/2020   Other: 06/08/2020       Scribe for Treatment Team: Felizardo Hoffmann, Theresia Majors 06/08/2020 11:55 AM

## 2020-06-08 NOTE — Progress Notes (Signed)
The patient rated her day as a 8 out of 10. She states that she spoke with her doctor today and was started on a new regimen of medication. Her goal for tomorrow is to go home.

## 2020-06-08 NOTE — Progress Notes (Signed)
   06/08/20 0727  Vital Signs  Pulse Rate (!) 108  Pulse Rate Source Monitor  Resp 16  BP (!) 111/91  BP Method Automatic  Oxygen Therapy  SpO2 100 %   D: Patient  Denies SI/HI/AVH. Patient admitted to some anxiety. Patient was out in open areas and was social with peers. A:  Patient took scheduled medicine. Pt. Was given 25 mg of Vistaril for anxiety.   Support and encouragement provided Routine safety checks conducted every 15 minutes. Patient  Informed to notify staff with any concerns.   R: Safety maintained.

## 2020-06-09 LAB — IRON AND TIBC
Iron: 39 ug/dL (ref 28–170)
Saturation Ratios: 8 % — ABNORMAL LOW (ref 10.4–31.8)
TIBC: 477 ug/dL — ABNORMAL HIGH (ref 250–450)
UIBC: 438 ug/dL

## 2020-06-09 LAB — BASIC METABOLIC PANEL
Anion gap: 11 (ref 5–15)
BUN: 17 mg/dL (ref 6–20)
CO2: 29 mmol/L (ref 22–32)
Calcium: 9.1 mg/dL (ref 8.9–10.3)
Chloride: 98 mmol/L (ref 98–111)
Creatinine, Ser: 0.77 mg/dL (ref 0.44–1.00)
GFR, Estimated: >60 mL/min (ref >60)
Glucose, Bld: 136 mg/dL — ABNORMAL HIGH (ref 70–99)
Potassium: 3.5 mmol/L (ref 3.5–5.1)
Sodium: 138 mmol/L (ref 135–145)

## 2020-06-09 MED ORDER — FLUOXETINE HCL 10 MG PO CAPS: 10.0000 mg | ORAL_CAPSULE | Freq: Every day | ORAL | 0 refills | Status: DC

## 2020-06-09 MED ORDER — QUETIAPINE FUMARATE 50 MG PO TABS: 50.0000 mg | ORAL_TABLET | Freq: Every day | ORAL | 0 refills | Status: DC

## 2020-06-09 MED ORDER — VITAMIN D (ERGOCALCIFEROL) 1.25 MG (50000 UNIT) PO CAPS: 50000.0000 [IU] | ORAL_CAPSULE | ORAL | 0 refills | Status: DC

## 2020-06-09 MED ORDER — PRAVASTATIN SODIUM 20 MG PO TABS: 20.0000 mg | ORAL_TABLET | Freq: Every day | ORAL | 0 refills | Status: DC

## 2020-06-09 NOTE — Discharge Summary (Signed)
Physician Discharge Summary Note  Patient:  Alice Martinez is an 45 y.o., female MRN:  [id number] DOB:  Dec 05, 1975 Patient phone:  [phone] (home)  Patient address:   [address] [place] [address],  Total Time spent with patient: 30 minutes  Date of Admission:  06/06/2020 Date of Discharge: 06/09/2020  Reason for Admission:  (from admission H&P): Penina Reisner is a 45 year old Caucasian female was admitted to the unit yesterday evening for severe depression and stating that her medications were not effective.  Patient was seen for admission evaluation this afternoon and she willingly participated in the interview.  Patient was tearful during our interaction.  She reports that she has not been on medications and that has increased her depression and anxiety.  Patient reports that she has not been sleeping and that all night she lays in bed experiencing her mind racing.  She reports that she has had issues getting her medications due to lack of insurance and financial difficulties.  Patient reports that she was not feeling well in the last two weeks and that her husband brought her to be checked out by mental health provider.  How she landed in the unit is confusing because she was seen and discharged by [place] Abts.  She later came to the unit as a walk in and was accepted.  She had informed Mr. Selena Batten that she was having auditory hallucinations, having conversation with unseen people and was paranoid.  Today she denied all.  She denied feeling suicidal.  Patient states she spent 28 days in jail last month for an accident, the police said she was under the influence of something.  She also had seizure disorder at the time but CT of her head was negative then.   Patient reports that has been on medications -Bupropion, Ambien, Xanax and Effexor.  Patient reports that a time she was taking Adderall for ADHD but does not want same at this time.  She admits past Cocaine and Heroin abuse and she has been  clean since December.  She admits to suicide attempt by OD on Ambien and Heroin twice in December.  Patient states she is able to get her medications now that her disability has been approved with food stamps.  We will start low dose Prozac and Quetiapine.  Patient denies feeling suicidal or having suicidal thoughts.  We will perform daily rounding and review care with members of our interdisciplinary team.  Patient has not been on her Keppra for a month and denies any seizures.  She has an appointment with a Neurologist next month.  Principal Problem: Major depressive disorder, recurrent severe without psychotic features [facility]) Discharge Diagnoses: Principal Problem:   Major depressive disorder, recurrent severe without psychotic features [facility])   Past Psychiatric History: Depression, Anxiety, Suicide attempt by OD x 2 last December 2021  Past Medical History:  Past Medical History:  Diagnosis Date  . ADHD (attention deficit hyperactivity disorder)   . Depression   . High cholesterol   . Hypertension     Past Surgical History:  Procedure Laterality Date  . TONSILLECTOMY    . TUBAL LIGATION     Family History:  Family History  Family history unknown: Yes   Family Psychiatric  History: Mom Died of Drug OD, Believes multiple Family members suffers from Mental illness but none seeks help Social History:  Social History   Substance and Sexual Activity  Alcohol Use Not Currently     Social History  Substance and Sexual Activity  Drug Use No    Social History   Socioeconomic History  . Marital status: Divorced    Spouse name: Not on file  . Number of children: Not on file  . Years of education: Not on file  . Highest education level: Not on file  Occupational History  . Not on file  Tobacco Use  . Smoking status: Current Every Day Smoker    Packs/day: 0.50    Types: Cigarettes  . Smokeless tobacco: Never Used  Substance and Sexual Activity  . Alcohol use: Not Currently   . Drug use: No  . Sexual activity: Yes    Birth control/protection: Surgical  Other Topics Concern  . Not on file  Social History Narrative  . Not on file   Social Determinants of Health   Financial Resource Strain: Not on file  Food Insecurity: Not on file  Transportation Needs: Not on file  Physical Activity: Not on file  Stress: Not on file  Social Connections: Not on file    Hospital Course:  After the above admission evaluation, Gladiola's presenting symptoms were noted. She was recommended for mood stabilization treatments. The medication regimen targeting those presenting symptoms were discussed with her & initiated with her consent. Her UDS on arrival to the ED was negative. Her targeted symptoms were medicated and she was stabilized & discharged on the medications as listed on her discharge medication list. Besides the mood stabilization treatments, Jaryah was also enrolled & participated in the group counseling sessions being offered & held on this unit. She learned coping skills. She presented no other significant pre-existing medical issues that required treatment. She tolerated his treatment regimen without any adverse effects or reactions reported.   During the course of her hospitalization, the 15-minute checks were adequate to ensure patient's safety. Desma did not display any dangerous, violent or suicidal behavior on the unit.  She interacted with patients & staff appropriately, participated appropriately in the group sessions/therapies. Her medications were addressed & adjusted to meet her needs. She was recommended for outpatient follow-up care & medication management upon discharge to assure continuity of care & mood stability.  At the time of discharge patient is not reporting any acute suicidal/homicidal ideations. She feels more confident about her self-care & in managing her mental health. She currently denies any new issues or concerns. Education and supportive counseling  provided throughout his/her hospital stay & upon discharge.   Today during her discharge evaluation with the attending psychiatrist, [place] shares she is doing well. She denies any other specific concerns. She is sleeping well. Her appetite is good. She denies other physical complaints. She denies AH/VH, delusional thoughts or paranoia. She does not appear to be responding to any internal stimuli. She feels that her medications have been helpful & is in agreement to continue her current treatment regimen as recommended. She was able to engage in safety planning including plan to return to [facility] or contact emergency services if she feels unable to maintain her own safety or the safety of others. Pt had no further questions, comments, or concerns. She left [facility] with all personal belongings in no apparent distress. Her husband will provide transportation home. Anneliese is stable for discharge home today.    Physical Findings: AIMS: Facial and Oral Movements Muscles of Facial Expression: None, normal Lips and Perioral Area: None, normal Jaw: None, normal Tongue: None, normal,Extremity Movements Upper (arms, wrists, hands, fingers): None, normal Lower (legs, knees, ankles, toes): None, normal,  Trunk Movements Neck, shoulders, hips: None, normal, Overall Severity Severity of abnormal movements (highest score from questions above): None, normal Incapacitation due to abnormal movements: None, normal Patient's awareness of abnormal movements (rate only patient's report): No Awareness, Dental Status Current problems with teeth and/or dentures?: No Does patient usually wear dentures?: No  CIWA:    COWS:     Musculoskeletal: Strength & Muscle Tone: within normal limits Gait & Station: normal Patient leans: N/A  Psychiatric Specialty Exam: See Psychiatric Specialty Exam and Suicide Risk Assessment completed by Attending Physician prior to discharge. Physical Exam Constitutional:      Appearance: Normal  appearance.  HENT:     Head: Normocephalic and atraumatic.  Musculoskeletal:        General: Normal range of motion.     Cervical back: Normal range of motion.  Neurological:     General: No focal deficit present.     Mental Status: She is alert and oriented to person, place, and time.  Psychiatric:        Attention and Perception: Attention normal.        Mood and Affect: Mood normal.        Speech: Speech normal.        Behavior: Behavior normal. Behavior is cooperative.        Thought Content: Thought content normal.     Review of Systems  Constitutional: Negative for activity change and appetite change.  Respiratory: Negative for chest tightness and shortness of breath.   Cardiovascular: Negative for chest pain.  Gastrointestinal: Negative for abdominal pain.  Neurological: Negative for facial asymmetry and headaches.    Blood pressure 101/75, pulse 97, temperature 97.8 F (36.6 C), temperature source Oral, resp. rate 16, height 5\' 1"  (1.549 m), weight 64.4 kg, SpO2 100 %.Body mass index is 26.83 kg/m.  Sleep:  Number of Hours: 4.75   Has this patient used any form of tobacco in the last 30 days? (Cigarettes, Smokeless Tobacco, Cigars, and/or Pipes) Yes, Yes, A prescription for an FDA-approved tobacco cessation medication was offered at discharge and the patient refused  Blood Alcohol level:  Lab Results  Component Value Date   ETH <10 04/15/2020    Metabolic Disorder Labs:  Lab Results  Component Value Date   HGBA1C 5.7 (H) 06/07/2020   MPG 116.89 06/07/2020   No results found for: PROLACTIN Lab Results  Component Value Date   CHOL 278 (H) 06/07/2020   TRIG 214 (H) 06/07/2020   HDL 61 06/07/2020   CHOLHDL 4.6 06/07/2020   VLDL 43 (H) 06/07/2020   LDLCALC 174 (H) 06/07/2020   Discharge destination:  Home  Is patient on multiple antipsychotic therapies at discharge:  Yes,   Do you recommend tapering to monotherapy for antipsychotics?  No   Has Patient had  three or more failed trials of antipsychotic monotherapy by history:  Yes,   Antipsychotic medications that previously failed include:   1.  Effexor.  Recommended Plan for Multiple Antipsychotic Therapies: NA  Discharge Instructions    Diet - low sodium heart healthy   Complete by: As directed    Increase activity slowly   Complete by: As directed      Allergies as of 06/09/2020   Not on File     Medication List    STOP taking these medications   ALPRAZolam 0.5 MG tablet Commonly known as: XANAX   buPROPion 150 MG 12 hr tablet Commonly known as: WELLBUTRIN SR   HYDROcodone-acetaminophen 5-325 MG tablet  Commonly known as: NORCO/VICODIN   levETIRAcetam 500 MG tablet Commonly known as: Keppra   multivitamin with minerals Tabs tablet   naproxen 500 MG tablet Commonly known as: NAPROSYN   potassium chloride SA 20 MEQ tablet Commonly known as: KLOR-CON   venlafaxine XR 37.5 MG 24 hr capsule Commonly known as: EFFEXOR-XR   zolpidem 10 MG tablet Commonly known as: AMBIEN     TAKE these medications     Indication  FLUoxetine 10 MG capsule Commonly known as: PROZAC Take 1 capsule (10 mg total) by mouth daily. Start taking on: June 10, 2020  Indication: Major Depressive Disorder   hydrochlorothiazide 25 MG tablet Commonly known as: HYDRODIURIL Take 25 mg by mouth daily.  Indication: High Blood Pressure Disorder   pravastatin 20 MG tablet Commonly known as: PRAVACHOL Take 1 tablet (20 mg total) by mouth daily at 6 PM.  Indication: High Amount of Fats in the Blood   QUEtiapine 50 MG tablet Commonly known as: SEROQUEL Take 1 tablet (50 mg total) by mouth at bedtime.  Indication: Manic-Depression, Depressive Phase of Manic-Depression   Vitamin D (Ergocalciferol) 1.25 MG (50000 UNIT) Caps capsule Commonly known as: DRISDOL Take 1 capsule (50,000 Units total) by mouth every 7 (seven) days. Start taking on: June 15, 2020  Indication: Vitamin D Deficiency        Follow-up Information    Services, [facility] Follow up.   Why: referral made Contact information: [address] [place] [address] [phone]        [facility] Go on 06/15/2020.   Specialty: Behavioral Health Why: You have a walk in appointment for therapy on 06/15/20 at 7:45 am.  You also have a walk in appointment for medication management services on 06/29/20 at 7:45 am.  Walk in services are available and are first come, first served and held in person. Contact information: [address] [place] [address] [phone]             Follow-up recommendations:  Activity:  as tolerated Diet:  Heart healthy  Comments:  Prescriptions e-prescribed to pharmacy on file at discharge.  Patient agreeable to discharge plan.  Given opportunity to ask questions.  Appears to feel comfortable with discharge denies any current suicidal or homicidal thoughts.  Patient is instructed prior to discharge to: Take all medications as prescribed by her mental healthcare provider. Report any adverse effects and or reactions from the medicines to her outpatient provider promptly. Patient has been instructed & cautioned: To not engage in alcohol and or illegal drug use while on prescription medicines. In the event of worsening symptoms, patient is instructed to call the mobile crisis hotline [phone], National Suicide Prevention Lifeline 800 214-696-3283, 911 and/or go to the nearest ED for appropriate evaluation and treatment of symptoms. To follow-up with her primary care provider for your other medical issues, concerns and or health care needs.   Signed: Laveda Abbe, NP 06/09/2020, 11:19 AM

## 2020-06-09 NOTE — Progress Notes (Signed)
Discharge Note:  Patient denies SI/HI AVH at this time. Discharge instructions, AVS, prescriptions and transition record gone over with patient. Patient agrees to comply with medication management, follow-up visit, and outpatient therapy. Patient belongings returned to patient. Patient questions and concerns addressed and answered.  Patient ambulatory off unit.  Patient discharged to home.   

## 2020-06-09 NOTE — [facility]HH Suicide Risk Assessment (Signed)
[facility] Discharge Suicide Risk Assessment   Principal Problem: Major depressive disorder, recurrent severe without psychotic features (HCC) Discharge Diagnoses: Principal Problem:   Major depressive disorder, recurrent severe without psychotic features (HCC)   Total Time spent with patient: 20 minutes  Musculoskeletal: Strength & Muscle Tone: within normal limits Gait & Station: normal Patient leans: N/A  Psychiatric Specialty Exam:   Blood pressure 101/75, pulse 97, temperature 97.8 F (36.6 C), temperature source Oral, resp. rate 16, height 5\' 1"  (1.549 m), weight 64.4 kg, SpO2 100 %.Body mass index is 26.83 kg/m.  General Appearance: Casual  Eye Contact::  Fair  Speech:  Clear and Coherent409  Volume:  Normal  Mood:  Euthymic  Affect:  Appropriate  Thought Process:  Coherent  Orientation:  Full (Time, Place, and Person)  Thought Content:  Logical  Suicidal Thoughts:  No  Homicidal Thoughts:  No  Memory:  Recent;   Fair  Judgement:  Fair  Insight:  Fair  Psychomotor Activity:  Normal  Concentration:  Fair  Recall:  [id number] of Knowledge:Fair  Language: Fair  Akathisia:  No  Handed:  Right  AIMS (if indicated):     Assets:  Desire for Improvement Physical Health Social Support Talents/Skills  Sleep:  Number of Hours: 4.75  Cognition: WNL  ADL's:  Intact   Mental Status Per Nursing Assessment::   On Admission:  NA  Demographic Factors:  Caucasian  Loss Factors: Legal issues  Historical Factors: Impulsivity  Risk Reduction Factors:   Sense of responsibility to family, Religious beliefs about death, Living with another person, especially a relative, Positive social support and Positive therapeutic relationship  Continued Clinical Symptoms:  Previous Psychiatric Diagnoses and Treatments  Cognitive Features That Contribute To Risk:  None    Suicide Risk:  Mild:  Suicidal ideation of limited frequency, intensity, duration, and specificity.  There are  no identifiable plans, no associated intent, mild dysphoria and related symptoms, good self-control (both objective and subjective assessment), few other risk factors, and identifiable protective factors, including available and accessible social support.   Follow-up Information    Services, [facility] Follow up.   Why: referral made Contact information: [address] [place] [id number]        [facility] Follow up.   Specialty: Behavioral Health Why: You have a walk in appointment for therapy and medication management services.  Walk in services are available and are first come, first served and held in person. Contact information: [address] [place] [phone]              Plan Of Care/Follow-up recommendations:  Other:  Follow-up with outpatient care  [phone], MD 06/09/2020, 10:45 AM

## 2020-06-09 NOTE — Progress Notes (Signed)
  [facility] Adult Case Management Discharge Plan :  Will you be returning to the same living situation after discharge:  Yes,  Home At discharge, do you have transportation home?: Yes,  Husband  Do you have the ability to pay for your medications: Yes,  Family   Release of information consent forms completed and in the chart;  Patient's signature needed at discharge.  Patient to Follow up at:  Follow-up Information    [facility], [facility] Follow up.   Why: referral made Contact information: [address] [place] [address] [phone]        [facility] Follow up.   Specialty: Behavioral Health Why: You have a walk in appointment for therapy on    You also have a walk in appointment for medication management services on     .  Walk in services are available and are first come, first served and held in person. Contact information: [address] [place] [address] [phone]              Next level of care provider has access to [facility] Link:yes  Safety Planning and Suicide Prevention discussed: Yes,  with patient      Has patient been referred to the Quitline?: Patient refused referral  Patient has been referred for addiction treatment: Pt. refused referral  Aram Beecham, LCSWA 06/09/2020, 10:54 AM

## 2020-07-01 ENCOUNTER — Telehealth: Payer: Self-pay | Admitting: Neurology

## 2020-07-01 ENCOUNTER — Telehealth: Payer: Self-pay | Admitting: *Deleted

## 2020-07-01 ENCOUNTER — Ambulatory Visit: Payer: Medicaid Other | Admitting: Neurology

## 2020-07-01 ENCOUNTER — Encounter: Payer: Self-pay | Admitting: Neurology

## 2020-07-01 NOTE — Telephone Encounter (Signed)
Pt no showed new patient appt this AM.

## 2020-07-01 NOTE — Progress Notes (Deleted)
[facility]    Provider:  Dr Lucia Gaskins Requesting Provider: Gordy Councilman, MD Primary Care Provider:  April Manson, NP  CC:    HPI:  Alice Martinez is a 45 y.o. female here as requested by Gordy Councilman, MD for leg weakness. PMHx HTN, high cholesterol, depression, adhd, cocaine abuse, syncope, CAD. I reviewed Dr. Rollene Fare notes: Patient was recently admitted for being found down, shaking episodes at home, recently started on Wellbutrin and urine tox positive for cocaine, neuro examination was limited by significant pain in the right lower extremity, possible weakness proximal and distal again limited by pain, reported numbness throughout the right foot slightly up the right calf, Achilles on the right mildly diminished compared to the left may be limited by patient relaxation secondary to pain, MRI was negative for acute intracranial process, MRI of the lumbar spine notable for predominantly right sided myositis, U tox positive for cocaine, EEG was declined and patient left AMA.  Sent for EMG nerve conduction study if symptoms persistent.  Also recommended CK, no seizure medication recommended provoked event, standard seizure precautions no driving.   Reviewed notes, labs and imaging from outside physicians, which showed ***  Review of Systems: Patient complains of symptoms per HPI as well as the following symptoms ***. Pertinent negatives and positives per HPI. All others negative.   Social History   Socioeconomic History  . Marital status: Divorced    Spouse name: Not on file  . Number of children: Not on file  . Years of education: Not on file  . Highest education level: Not on file  Occupational History  . Not on file  Tobacco Use  . Smoking status: Current Every Day Smoker    Packs/day: 0.50    Types: Cigarettes  . Smokeless tobacco: Never Used  Substance and Sexual Activity  . Alcohol use: Not Currently  . Drug use: No  . Sexual activity: Yes     Birth control/protection: Surgical  Other Topics Concern  . Not on file  Social History Narrative  . Not on file   Social Determinants of Health   Financial Resource Strain: Not on file  Food Insecurity: Not on file  Transportation Needs: Not on file  Physical Activity: Not on file  Stress: Not on file  Social Connections: Not on file  Intimate Partner Violence: Not on file    Family History  Family history unknown: Yes    Past Medical History:  Diagnosis Date  . ADHD (attention deficit hyperactivity disorder)   . Depression   . High cholesterol   . Hypertension     Patient Active Problem List   Diagnosis Date Noted  . Polysubstance abuse (HCC) 06/07/2020  . Major depressive disorder, recurrent severe without psychotic features (HCC) 06/06/2020  . Syncope 04/16/2020  . Rhabdomyolysis 04/16/2020  . Cocaine abuse (HCC) 04/16/2020  . Weakness of right lower extremity 04/16/2020  . PTSD (post-traumatic stress disorder) 05/27/2016  . Dysthymia 05/27/2016  . GAD (generalized anxiety disorder) 01/15/2016  . Major depressive disorder, recurrent episode, moderate (HCC) 01/15/2016  . Essential hypertension 01/15/2016  . 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 Mixed hyperlipidemia 12/09/2010    Past Surgical History:  Procedure Laterality Date  . TONSILLECTOMY    . TUBAL LIGATION      Current Outpatient Medications  Medication Sig Dispense Refill  . FLUoxetine (PROZAC) 10 MG capsule Take 1 capsule (10 mg total) by mouth daily. 30 capsule 0  . hydrochlorothiazide (HYDRODIURIL)  25 MG tablet Take 25 mg by mouth daily.    . pravastatin (PRAVACHOL) 20 MG tablet Take 1 tablet (20 mg total) by mouth daily at 6 PM. 30 tablet 0  . QUEtiapine (SEROQUEL) 50 MG tablet Take 1 tablet (50 mg total) by mouth at bedtime. 30 tablet 0  . Vitamin D, Ergocalciferol, (DRISDOL) 1.25 MG (50000 UNIT) CAPS capsule Take 1 capsule (50,000 Units total) by mouth every 7 (seven) days. 5 capsule 0    No current facility-administered medications for this visit.    Allergies as of 07/01/2020  . (Not on File)    Vitals: There were no vitals taken for this visit. Last Weight:  Wt Readings from Last 1 Encounters:  04/16/20 137 lb (62.1 kg)   Last Height:   Ht Readings from Last 1 Encounters:  04/16/20 5\' 1"  (1.549 m)     Physical exam: Exam: Gen: NAD, conversant, well nourised, obese, well groomed                     CV: RRR, no MRG. No Carotid Bruits. No peripheral edema, warm, nontender Eyes: Conjunctivae clear without exudates or hemorrhage  Neuro: Detailed Neurologic Exam  Speech:    Speech is normal; fluent and spontaneous with normal comprehension.  Cognition:    The patient is oriented to person, place, and time;     recent and remote memory intact;     language fluent;     normal attention, concentration,     fund of knowledge Cranial Nerves:    The pupils are equal, round, and reactive to light. The fundi are normal and spontaneous venous pulsations are present. Visual fields are full to finger confrontation. Extraocular movements are intact. Trigeminal sensation is intact and the muscles of mastication are normal. The face is symmetric. The palate elevates in the midline. Hearing intact. Voice is normal. Shoulder shrug is normal. The tongue has normal motion without fasciculations.   Coordination:    Normal finger to nose and heel to shin. Normal rapid alternating movements.   Gait:    Heel-toe and tandem gait are normal.   Motor Observation:    No asymmetry, no atrophy, and no involuntary movements noted. Tone:    Normal muscle tone.    Posture:    Posture is normal. normal erect    Strength:    Strength is V/V in the upper and lower limbs.      Sensation: intact to LT     Reflex Exam:  DTR's:    Deep tendon reflexes in the upper and lower extremities are normal bilaterally.   Toes:    The toes are downgoing bilaterally.   Clonus:     Clonus is absent.    Assessment/Plan:    No orders of the defined types were placed in this encounter.  No orders of the defined types were placed in this encounter.   Cc: Bhagat, , MD,  Karmen Bongo, NP  April Manson, MD  [facility] [address] [place], [place]  Phone [phone] Fax [phone]

## 2020-07-01 NOTE — Telephone Encounter (Signed)
Patient no showed new patient neurology visit which was a follow-up from her hospitalization where she left AMA.  If patient calls back please inform her that another no-show or cancellation within 24 hours may have her dismissed from our practice, also inform her that we have many other patients waiting for new appointments.  She can be scheduled with any physician in the practice if she still feels as though she needs a neurology appointment.

## 2020-09-15 ENCOUNTER — Other Ambulatory Visit: Payer: Self-pay

## 2020-09-15 ENCOUNTER — Emergency Department (HOSPITAL_BASED_OUTPATIENT_CLINIC_OR_DEPARTMENT_OTHER): Payer: Self-pay

## 2020-09-15 ENCOUNTER — Encounter (HOSPITAL_BASED_OUTPATIENT_CLINIC_OR_DEPARTMENT_OTHER): Payer: Self-pay | Admitting: Emergency Medicine

## 2020-09-15 ENCOUNTER — Emergency Department (HOSPITAL_BASED_OUTPATIENT_CLINIC_OR_DEPARTMENT_OTHER)
Admission: EM | Admit: 2020-09-15 | Discharge: 2020-09-15 | Disposition: A | Discharge status: Home / Self Care | Payer: Self-pay | Attending: Emergency Medicine | Admitting: Emergency Medicine

## 2020-09-15 DIAGNOSIS — K30 Functional dyspepsia: Secondary | ICD-10-CM

## 2020-09-15 DIAGNOSIS — I10 Essential (primary) hypertension: Secondary | ICD-10-CM | POA: Insufficient documentation

## 2020-09-15 DIAGNOSIS — Z79899 Other long term (current) drug therapy: Secondary | ICD-10-CM | POA: Insufficient documentation

## 2020-09-15 DIAGNOSIS — Z20822 Contact with and (suspected) exposure to covid-19: Secondary | ICD-10-CM | POA: Insufficient documentation

## 2020-09-15 DIAGNOSIS — F17210 Nicotine dependence, cigarettes, uncomplicated: Secondary | ICD-10-CM | POA: Insufficient documentation

## 2020-09-15 DIAGNOSIS — E876 Hypokalemia: Secondary | ICD-10-CM | POA: Insufficient documentation

## 2020-09-15 DIAGNOSIS — R198 Other specified symptoms and signs involving the digestive system and abdomen: Secondary | ICD-10-CM | POA: Insufficient documentation

## 2020-09-15 DIAGNOSIS — Z28310 Unvaccinated for covid-19: Secondary | ICD-10-CM | POA: Insufficient documentation

## 2020-09-15 DIAGNOSIS — R5383 Other fatigue: Secondary | ICD-10-CM | POA: Insufficient documentation

## 2020-09-15 DIAGNOSIS — M549 Dorsalgia, unspecified: Secondary | ICD-10-CM | POA: Insufficient documentation

## 2020-09-15 DIAGNOSIS — R531 Weakness: Secondary | ICD-10-CM | POA: Insufficient documentation

## 2020-09-15 DIAGNOSIS — R079 Chest pain, unspecified: Secondary | ICD-10-CM | POA: Insufficient documentation

## 2020-09-15 HISTORY — DX: Disorder of thyroid, unspecified: E07.9

## 2020-09-15 LAB — CBC WITH DIFFERENTIAL/PLATELET
Abs Immature Granulocytes: 0.02 10*3/uL (ref 0.00–0.07)
Basophils Absolute: 0 10*3/uL (ref 0.0–0.1)
Basophils Relative: 1 %
Eosinophils Absolute: 0.1 10*3/uL (ref 0.0–0.5)
Eosinophils Relative: 1 %
HCT: 41.5 % (ref 36.0–46.0)
Hemoglobin: 14.1 g/dL (ref 12.0–15.0)
Immature Granulocytes: 0 %
Lymphocytes Relative: 18 %
Lymphs Abs: 1.5 10*3/uL (ref 0.7–4.0)
MCH: 28.4 pg (ref 26.0–34.0)
MCHC: 34 g/dL (ref 30.0–36.0)
MCV: 83.7 fL (ref 80.0–100.0)
Monocytes Absolute: 0.5 10*3/uL (ref 0.1–1.0)
Monocytes Relative: 6 %
Neutro Abs: 6.2 10*3/uL (ref 1.7–7.7)
Neutrophils Relative %: 74 %
Platelets: 347 10*3/uL (ref 150–400)
RBC: 4.96 MIL/uL (ref 3.87–5.11)
RDW: 13.8 % (ref 11.5–15.5)
WBC: 8.4 10*3/uL (ref 4.0–10.5)
nRBC: 0 % (ref 0.0–0.2)

## 2020-09-15 LAB — URINALYSIS, ROUTINE W REFLEX MICROSCOPIC
Bilirubin Urine: NEGATIVE
Glucose, UA: NEGATIVE mg/dL
Hgb urine dipstick: NEGATIVE
Ketones, ur: NEGATIVE mg/dL
Leukocytes,Ua: NEGATIVE
Nitrite: NEGATIVE
Protein, ur: NEGATIVE mg/dL
Specific Gravity, Urine: 1.01 (ref 1.005–1.030)
pH: 7 (ref 5.0–8.0)

## 2020-09-15 LAB — COMPREHENSIVE METABOLIC PANEL
ALT: 23 U/L (ref 0–44)
AST: 24 U/L (ref 15–41)
Albumin: 4 g/dL (ref 3.5–5.0)
Alkaline Phosphatase: 49 U/L (ref 38–126)
Anion gap: 9 (ref 5–15)
BUN: 11 mg/dL (ref 6–20)
CO2: 29 mmol/L (ref 22–32)
Calcium: 9.1 mg/dL (ref 8.9–10.3)
Chloride: 98 mmol/L (ref 98–111)
Creatinine, Ser: 0.71 mg/dL (ref 0.44–1.00)
GFR, Estimated: >60 mL/min (ref >60)
Glucose, Bld: 115 mg/dL — ABNORMAL HIGH (ref 70–99)
Potassium: 3 mmol/L — ABNORMAL LOW (ref 3.5–5.1)
Sodium: 136 mmol/L (ref 135–145)
Total Bilirubin: 0.4 mg/dL (ref 0.3–1.2)
Total Protein: 6.9 g/dL (ref 6.5–8.1)

## 2020-09-15 LAB — TROPONIN I (HIGH SENSITIVITY): Troponin I (High Sensitivity): <2 ng/L (ref <18)

## 2020-09-15 LAB — MONONUCLEOSIS SCREEN: Mono Screen: NEGATIVE

## 2020-09-15 LAB — RESP PANEL BY RT-PCR (FLU A&B, COVID) ARPGX2
Influenza A by PCR: NEGATIVE
Influenza B by PCR: NEGATIVE
SARS Coronavirus 2 by RT PCR: NEGATIVE

## 2020-09-15 LAB — TSH: TSH: 0.404 u[IU]/mL (ref 0.350–4.500)

## 2020-09-15 LAB — LIPASE, BLOOD: Lipase: 25 U/L (ref 11–51)

## 2020-09-15 LAB — T4, FREE: Free T4: 0.73 ng/dL (ref 0.61–1.12)

## 2020-09-15 LAB — PREGNANCY, URINE: Preg Test, Ur: NEGATIVE

## 2020-09-15 IMAGING — DX DG CHEST 1V PORT
1 series · 1 of 1 positions shown · non-contrast
Comparison: [DATE]

CLINICAL DATA: Chest pain

EXAM:
PORTABLE CHEST 1 VIEW

[chest ap]
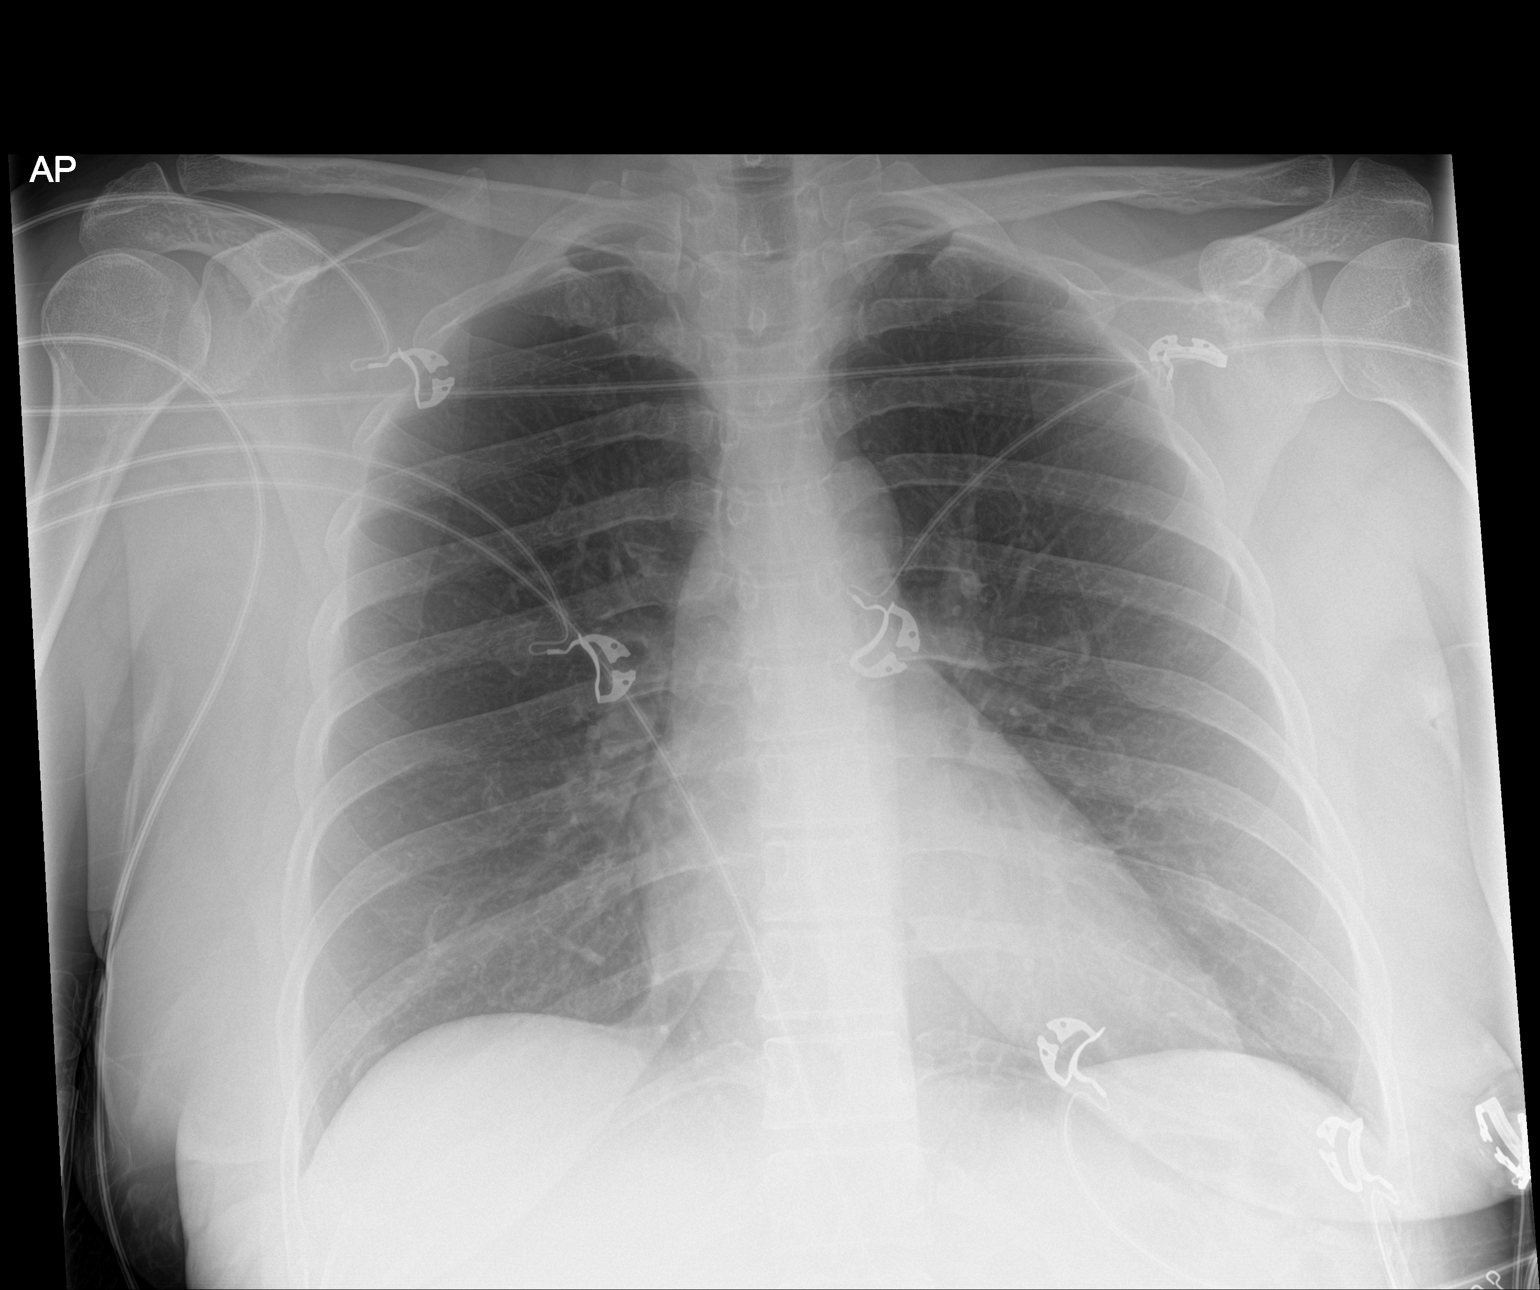

[1 of 1 positions shown; findings below may reference images not displayed]

FINDINGS: Lungs are clear. Heart size and pulmonary vascularity are normal. No
adenopathy. No pneumothorax. No bone lesions.
IMPRESSION: Lungs clear.  Cardiac silhouette normal.

## 2020-09-15 MED ORDER — POTASSIUM CHLORIDE ER 10 MEQ PO TBCR: 10.0000 meq | EXTENDED_RELEASE_TABLET | Freq: Every day | ORAL | 0 refills | Status: DC

## 2020-09-15 MED ORDER — DICYCLOMINE HCL 20 MG PO TABS: 20.0000 mg | ORAL_TABLET | Freq: Two times a day (BID) | ORAL | 0 refills | Status: DC

## 2020-09-15 MED ORDER — IBUPROFEN 600 MG PO TABS: 600.0000 mg | ORAL_TABLET | Freq: Four times a day (QID) | ORAL | 0 refills | Status: AC | PRN

## 2020-09-15 MED ORDER — MORPHINE SULFATE (PF) 4 MG/ML IV SOLN
4.0000 mg | Freq: Once | INTRAVENOUS | Status: AC
Start: 2020-09-15 — End: 2020-09-15
  Administered 2020-09-15: 4 mg via INTRAVENOUS
  Filled 2020-09-15: qty 1

## 2020-09-15 MED ORDER — DEXAMETHASONE SODIUM PHOSPHATE 10 MG/ML IJ SOLN
8.0000 mg | Freq: Once | INTRAMUSCULAR | Status: AC
  Administered 2020-09-15: 8 mg via INTRAVENOUS
  Filled 2020-09-15: qty 1

## 2020-09-15 MED ORDER — KETOROLAC TROMETHAMINE 30 MG/ML IJ SOLN
30.0000 mg | Freq: Once | INTRAMUSCULAR | Status: AC
  Administered 2020-09-15: 30 mg via INTRAVENOUS
  Filled 2020-09-15: qty 1

## 2020-09-15 NOTE — ED Triage Notes (Signed)
Pt arrives pov with c/o left flank pain radiating to back with nausea, decreased appetite x 4 days. Pt also endorses rib pain under left breast, denies shob. Pt recently started taking thyroid medication

## 2020-09-15 NOTE — Discharge Instructions (Addendum)
I felt that the pain you are feeling in your back and chest is most likely coming from your spine.  A pinched nerve in your back can be causing pain along your ribs.  I gave you a dose of steroids in the ER, and advised that you continue taking ibuprofen and Tylenol as needed at home for pain.  If you continue having persistent pain, your primary care physician may consider getting an MRI of your thoracic spine without contrast as an outpatient.  Alternatively, you could try to schedule an appointment at the spine clinic at the number above.  *  Your potassium levels a little low today.  This may be due to a poor diet, or to chronic diarrhea.  I prescribed some potassium pills but she should begin taking regularly every day.  You can also read over the list of foods containing potassium, to add to your diet.  Your PCP can follow up on your thyroid levels tomorrow, and make adjustments as needed.  Set up a follow up appointment with your PCP in 1 week - to see how you are feeling, and also to recheck your potassium level.  You can take bentyl as needed for stomach upset at home.

## 2020-09-15 NOTE — ED Provider Notes (Signed)
[facility] EMERGENCY DEPARTMENT Provider Note   CSN: [id number] Arrival date & time: 09/15/20  0815     History Chief Complaint  Patient presents with  . Flank Pain    Alice Martinez is a 45 y.o. female presenting to emergency department with back pain.  The patient reports gradual onset of symptoms approximately 4 days ago, when she woke up feeling generally fatigued, weak, and had bilateral chest pain.  This was radiating across her lower chest towards her back bilaterally.  However the pain in the rest of the chest is diminished, whereas left-sided chest is been very persistent and constant for 4 days.  She has never had this kind of issues before.  The pain is worse with sitting up and movement, better with laying flat.  She denies any dysuria, hematuria.  She denies any falls or trauma.  She denies any cough.  She has not had the COVID vaccines.  She does report a mild headache, sore throat, general fatigue for the past 4 days.  She reports diffuse myalgias, particularly in her bilateral legs which have been aching.  She also reports upset stomach, loose bowel movements, gas pains.  She reports her PCP recently made adjustments to her thyroid medications, synthyroid, because her TSH levels were too high.  HPI     Past Medical History:  Diagnosis Date  . ADHD (attention deficit hyperactivity disorder)   . Depression   . High cholesterol   . Hypertension   . Thyroid disease     Patient Active Problem List   Diagnosis Date Noted  . Polysubstance abuse (HCC) 06/07/2020  . Major depressive disorder, recurrent severe without psychotic features (HCC) 06/06/2020  . Syncope 04/16/2020  . Rhabdomyolysis 04/16/2020  . Cocaine abuse (HCC) 04/16/2020  . Weakness of right lower extremity 04/16/2020  . PTSD (post-traumatic stress disorder) 05/27/2016  . Dysthymia 05/27/2016  . GAD (generalized anxiety disorder) 01/15/2016  . Major depressive disorder, recurrent  episode, moderate (HCC) 01/15/2016  . Essential hypertension 01/15/2016  . 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 Mixed hyperlipidemia 12/09/2010    Past Surgical History:  Procedure Laterality Date  . TONSILLECTOMY    . TUBAL LIGATION       OB History   No obstetric history on file.     Family History  Family history unknown: Yes    Social History   Tobacco Use  . Smoking status: Current Every Day Smoker    Packs/day: 0.50    Types: Cigarettes  . Smokeless tobacco: Never Used  Substance Use Topics  . Alcohol use: Not Currently  . Drug use: No    Home Medications Prior to Admission medications   Medication Sig Start Date End Date Taking? Authorizing Provider  dicyclomine (BENTYL) 20 MG tablet Take 1 tablet (20 mg total) by mouth 2 (two) times daily for 15 days. For stomach cramping and upset 09/15/20 09/30/20 Yes Denasia Venn, Kermit Balo, MD  ibuprofen (ADVIL) 600 MG tablet Take 1 tablet (600 mg total) by mouth every 6 (six) hours as needed for headache or moderate pain. 09/15/20 10/15/20 Yes Braulio Kiedrowski, Kermit Balo, MD  levothyroxine (SYNTHROID) 25 MCG tablet Take by mouth. 09/11/20  Yes [provider]  potassium chloride (KLOR-CON) 10 MEQ tablet Take 1 tablet (10 mEq total) by mouth daily. 09/15/20 10/15/20 Yes Irene Mitcham, Kermit Balo, MD  FLUoxetine (PROZAC) 10 MG capsule Take 1 capsule (10 mg total) by mouth daily. 06/10/20   Laveda Abbe, NP  hydrochlorothiazide (HYDRODIURIL)  25 MG tablet Take 25 mg by mouth daily. 01/28/19   [provider]  pravastatin (PRAVACHOL) 20 MG tablet Take 1 tablet (20 mg total) by mouth daily at 6 PM. 06/09/20   Laveda Abbe, NP  QUEtiapine (SEROQUEL) 50 MG tablet Take 1 tablet (50 mg total) by mouth at bedtime. 06/09/20   Laveda Abbe, NP  Vitamin D, Ergocalciferol, (DRISDOL) 1.25 MG (50000 UNIT) CAPS capsule Take 1 capsule (50,000 Units total) by mouth every 7 (seven) days. 06/15/20   Laveda Abbe, NP     Allergies    Patient has no allergy information on record.  Review of Systems   Review of Systems  Constitutional: Positive for appetite change, chills and fatigue. Negative for fever.  HENT: Positive for sore throat. Negative for ear pain.   Eyes: Negative for pain and visual disturbance.  Respiratory: Positive for shortness of breath. Negative for cough.   Cardiovascular: Negative for chest pain and palpitations.  Gastrointestinal: Positive for abdominal pain and nausea. Negative for constipation, diarrhea and vomiting.  Genitourinary: Negative for dysuria and hematuria.  Musculoskeletal: Positive for arthralgias, back pain and myalgias.  Skin: Negative for color change and rash.  Neurological: Positive for headaches. Negative for syncope.  All other systems reviewed and are negative.   Physical Exam Updated Vital Signs BP 124/68   Pulse 86   Temp 99.1 F (37.3 C) (Oral)   Resp (!) 21   Ht 5\' 1"  (1.549 m)   Wt 70.3 kg   LMP 09/03/2020   SpO2 100%   BMI 29.29 kg/m   Physical Exam Constitutional:      General: She is not in acute distress. HENT:     Head: Normocephalic and atraumatic.  Eyes:     Conjunctiva/sclera: Conjunctivae normal.     Pupils: Pupils are equal, round, and reactive to light.  Cardiovascular:     Rate and Rhythm: Normal rate and regular rhythm.     Pulses: Normal pulses.  Pulmonary:     Effort: Pulmonary effort is normal. No respiratory distress.  Abdominal:     General: There is no distension.     Tenderness: There is no abdominal tenderness. There is no right CVA tenderness, left CVA tenderness or guarding.  Musculoskeletal:     Comments: Bilateral lower back paraspinal tenderness Left sided chest pain worse with sitting upright, improved laying flat No spinal midline tenderness  Skin:    General: Skin is warm and dry.  Neurological:     General: No focal deficit present.     Mental Status: She is alert. Mental status is at baseline.   Psychiatric:        Mood and Affect: Mood normal.        Behavior: Behavior normal.     ED Results / Procedures / Treatments   Labs (all labs ordered are listed, but only abnormal results are displayed) Labs Reviewed  COMPREHENSIVE METABOLIC PANEL - Abnormal; Notable for the following components:      Result Value   Potassium 3.0 (*)    Glucose, Bld 115 (*)    All other components within normal limits  RESP PANEL BY RT-PCR (FLU A&B, COVID) ARPGX2  CBC WITH DIFFERENTIAL/PLATELET  LIPASE, BLOOD  PREGNANCY, URINE  URINALYSIS, ROUTINE W REFLEX MICROSCOPIC  MONONUCLEOSIS SCREEN  TSH  T4, FREE  TROPONIN I (HIGH SENSITIVITY)    EKG EKG Interpretation  Date/Time:  Tuesday Sep 15 2020 08:40:37 EDT Ventricular Rate:  83 PR Interval:  131 QRS Duration: 81 QT Interval:  376 QTC Calculation: 442 R Axis:   60 Text Interpretation: Sinus rhythm Confirmed by Alvester Chou [phone]) on 09/15/2020 8:59:27 AM   Radiology DG Chest Portable 1 View  Result Date: 09/15/2020 CLINICAL DATA:  Chest pain EXAM: PORTABLE CHEST 1 VIEW COMPARISON:  June 13, 2016 FINDINGS: Lungs are clear. Heart size and pulmonary vascularity are normal. No adenopathy. No pneumothorax. No bone lesions. IMPRESSION: Lungs clear.  Cardiac silhouette normal. Electronically Signed   By: Bretta Bang III M.D.   On: 09/15/2020 09:35    Procedures Procedures   Medications Ordered in ED Medications  ketorolac (TORADOL) 30 MG/ML injection 30 mg (30 mg Intravenous Given 09/15/20 0926)  dexamethasone (DECADRON) injection 8 mg (8 mg Intravenous Given 09/15/20 1237)  morphine 4 MG/ML injection 4 mg (4 mg Intravenous Given 09/15/20 1237)    ED Course  I have reviewed the triage vital signs and the nursing notes.  Pertinent labs & imaging results that were available during my care of the patient were reviewed by me and considered in my medical decision making (see chart for details).  DDx for patient complaint  includes UTI vs viral infection vs referred thoracic spine pain vs PNA vs other  I ordered, reviewed, and interpreted labs.  No life-threatening abnormalities were noted on these tests.  Trop <2, ECG per my interpretation does not show ischemic disease - doubt ACS. WBC normal.  No evidence of PNA on dg chest.   K mildly low at 3.0.  Can start on oral K. Mono negative. Abd labs and lipase wnl - doubt pancreatitis, acute biliary disease UA without evidence of infection - doubt pyelo at this time  TSH and T4 levels sent - later reviewed, showing normal levels on her current synthroid regimen.  After the interventions stated above, I reevaluated the patient and found she had some symptomatic improvement.  We discussed NSAIDS and a shot of decadron here for suspected neuropathic or referred pain from the spine, given how positional this is, worse with axial loading on spine.  I advised PCP follow up.         Final Clinical Impression(s) / ED Diagnoses Final diagnoses:  Acute left-sided back pain, unspecified back location  Other fatigue  Stomach upset  Hypokalemia    Rx / DC Orders ED Discharge Orders         Ordered    dicyclomine (BENTYL) 20 MG tablet  2 times daily        09/15/20 1235    potassium chloride (KLOR-CON) 10 MEQ tablet  Daily        09/15/20 1235    ibuprofen (ADVIL) 600 MG tablet  Every 6 hours PRN        09/15/20 1237           Terald Sleeper, MD 09/15/20 1746

## 2020-12-21 ENCOUNTER — Ambulatory Visit: Payer: Medicaid Other | Admitting: Neurology

## 2021-01-15 ENCOUNTER — Telehealth (INDEPENDENT_AMBULATORY_CARE_PROVIDER_SITE_OTHER): Payer: 59 | Admitting: Psychiatry

## 2021-01-15 ENCOUNTER — Encounter (HOSPITAL_COMMUNITY): Payer: Self-pay | Admitting: Psychiatry

## 2021-01-15 DIAGNOSIS — F1910 Other psychoactive substance abuse, uncomplicated: Secondary | ICD-10-CM | POA: Diagnosis not present

## 2021-01-15 DIAGNOSIS — F332 Major depressive disorder, recurrent severe without psychotic features: Secondary | ICD-10-CM | POA: Diagnosis not present

## 2021-01-15 DIAGNOSIS — F4310 Post-traumatic stress disorder, unspecified: Secondary | ICD-10-CM

## 2021-01-15 DIAGNOSIS — F319 Bipolar disorder, unspecified: Secondary | ICD-10-CM

## 2021-01-15 DIAGNOSIS — F411 Generalized anxiety disorder: Secondary | ICD-10-CM

## 2021-01-15 DIAGNOSIS — F1410 Cocaine abuse, uncomplicated: Secondary | ICD-10-CM

## 2021-01-15 DIAGNOSIS — F419 Anxiety disorder, unspecified: Secondary | ICD-10-CM

## 2021-01-15 DIAGNOSIS — F5105 Insomnia due to other mental disorder: Secondary | ICD-10-CM

## 2021-01-15 MED ORDER — FLUOXETINE HCL 10 MG PO CAPS: 10.0000 mg | ORAL_CAPSULE | Freq: Every day | ORAL | 0 refills | Status: DC

## 2021-01-15 MED ORDER — QUETIAPINE FUMARATE 25 MG PO TABS: 50.0000 mg | ORAL_TABLET | Freq: Every day | ORAL | 0 refills | Status: DC
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
## 2021-01-18 ENCOUNTER — Ambulatory Visit (INDEPENDENT_AMBULATORY_CARE_PROVIDER_SITE_OTHER): Payer: 59 | Admitting: Licensed Clinical Social Worker

## 2021-01-18 DIAGNOSIS — F411 Generalized anxiety disorder: Secondary | ICD-10-CM | POA: Diagnosis not present

## 2021-01-18 DIAGNOSIS — F331 Major depressive disorder, recurrent, moderate: Secondary | ICD-10-CM | POA: Diagnosis not present

## 2021-01-18 DIAGNOSIS — F319 Bipolar disorder, unspecified: Secondary | ICD-10-CM | POA: Diagnosis not present
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
## 2021-02-15 ENCOUNTER — Ambulatory Visit: Payer: Medicaid Other | Admitting: Neurology

## 2021-02-16 ENCOUNTER — Ambulatory Visit (HOSPITAL_COMMUNITY): Payer: 59 | Admitting: Psychiatry

## 2021-02-24 ENCOUNTER — Ambulatory Visit (INDEPENDENT_AMBULATORY_CARE_PROVIDER_SITE_OTHER): Payer: 59 | Admitting: Licensed Clinical Social Worker

## 2021-02-24 DIAGNOSIS — F4310 Post-traumatic stress disorder, unspecified: Secondary | ICD-10-CM

## 2021-02-24 DIAGNOSIS — F319 Bipolar disorder, unspecified: Secondary | ICD-10-CM

## 2021-02-24 DIAGNOSIS — F411 Generalized anxiety disorder: Secondary | ICD-10-CM | POA: Diagnosis not present

## 2021-02-24 DIAGNOSIS — F331 Major depressive disorder, recurrent, moderate: Secondary | ICD-10-CM | POA: Diagnosis not present
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
## 2021-02-25 ENCOUNTER — Ambulatory Visit (INDEPENDENT_AMBULATORY_CARE_PROVIDER_SITE_OTHER): Payer: 59 | Admitting: Psychiatry

## 2021-02-25 ENCOUNTER — Encounter (HOSPITAL_COMMUNITY): Payer: Self-pay | Admitting: Psychiatry

## 2021-02-25 VITALS — BP 108/64 | HR 86 | Ht 61.0 in | Wt 134.0 lb

## 2021-02-25 DIAGNOSIS — F331 Major depressive disorder, recurrent, moderate: Secondary | ICD-10-CM

## 2021-02-25 DIAGNOSIS — F1910 Other psychoactive substance abuse, uncomplicated: Secondary | ICD-10-CM | POA: Diagnosis not present

## 2021-02-25 DIAGNOSIS — F411 Generalized anxiety disorder: Secondary | ICD-10-CM

## 2021-02-25 DIAGNOSIS — F319 Bipolar disorder, unspecified: Secondary | ICD-10-CM

## 2021-02-25 MED ORDER — QUETIAPINE FUMARATE 100 MG PO TABS: 100.0000 mg | ORAL_TABLET | Freq: Every day | ORAL | 0 refills | Status: DC

## 2021-02-25 MED ORDER — BUSPIRONE HCL 7.5 MG PO TABS: 7.5000 mg | ORAL_TABLET | Freq: Every day | ORAL | 0 refills | Status: DC

## 2021-02-25 MED ORDER — FLUOXETINE HCL 20 MG PO CAPS: 20.0000 mg | ORAL_CAPSULE | Freq: Every day | ORAL | 0 refills | Status: DC
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
## 2021-03-02 ENCOUNTER — Telehealth (HOSPITAL_COMMUNITY): Payer: Self-pay | Admitting: Psychiatry

## 2021-03-02 NOTE — Telephone Encounter (Signed)
Left vm asking if someone can call her - she has questions about her medication.

## 2021-03-03 ENCOUNTER — Telehealth (HOSPITAL_COMMUNITY): Payer: Self-pay | Admitting: Psychiatry

## 2021-03-03 ENCOUNTER — Other Ambulatory Visit (HOSPITAL_COMMUNITY): Payer: Self-pay | Admitting: *Deleted

## 2021-03-03 ENCOUNTER — Ambulatory Visit (HOSPITAL_COMMUNITY): Payer: 59 | Admitting: Licensed Clinical Social Worker

## 2021-03-03 DIAGNOSIS — F331 Major depressive disorder, recurrent, moderate: Secondary | ICD-10-CM

## 2021-03-03 DIAGNOSIS — F4310 Post-traumatic stress disorder, unspecified: Secondary | ICD-10-CM

## 2021-03-03 DIAGNOSIS — F319 Bipolar disorder, unspecified: Secondary | ICD-10-CM

## 2021-03-03 MED ORDER — QUETIAPINE FUMARATE 200 MG PO TABS
200.0000 mg | ORAL_TABLET | Freq: Every day | ORAL | 0 refills | Status: DC
Start: 2021-03-03 — End: 2021-03-30

## 2021-03-03 NOTE — Telephone Encounter (Signed)
Pt wanted to request more because she was given 30 and has to take 2 a day. She will run out before next appt.  She also said that this is working for her better than anything she has tried  Refill: QUEtiapine (SEROQUEL) 100 MG tablet  Send to: [facility], [address] [place], [place] [address]

## 2021-03-03 NOTE — Telephone Encounter (Signed)
Sent Seroquel 200 mg --take 1 tab at night ok by Dr. Lynnae Sandhoff

## 2021-03-10 ENCOUNTER — Ambulatory Visit (HOSPITAL_COMMUNITY): Payer: 59 | Admitting: Licensed Clinical Social Worker

## 2021-03-17 ENCOUNTER — Ambulatory Visit (INDEPENDENT_AMBULATORY_CARE_PROVIDER_SITE_OTHER): Payer: 59 | Admitting: Licensed Clinical Social Worker

## 2021-03-17 DIAGNOSIS — F331 Major depressive disorder, recurrent, moderate: Secondary | ICD-10-CM | POA: Diagnosis not present

## 2021-03-17 DIAGNOSIS — F1410 Cocaine abuse, uncomplicated: Secondary | ICD-10-CM | POA: Diagnosis not present

## 2021-03-17 DIAGNOSIS — F319 Bipolar disorder, unspecified: Secondary | ICD-10-CM

## 2021-03-17 DIAGNOSIS — F411 Generalized anxiety disorder: Secondary | ICD-10-CM
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
## 2021-03-19 ENCOUNTER — Encounter (HOSPITAL_COMMUNITY): Payer: Self-pay | Admitting: Licensed Clinical Social Worker

## 2021-03-30 ENCOUNTER — Ambulatory Visit (INDEPENDENT_AMBULATORY_CARE_PROVIDER_SITE_OTHER): Payer: 59 | Admitting: Psychiatry

## 2021-03-30 ENCOUNTER — Encounter (HOSPITAL_COMMUNITY): Payer: Self-pay | Admitting: Psychiatry

## 2021-03-30 DIAGNOSIS — F4310 Post-traumatic stress disorder, unspecified: Secondary | ICD-10-CM | POA: Diagnosis not present

## 2021-03-30 DIAGNOSIS — F319 Bipolar disorder, unspecified: Secondary | ICD-10-CM

## 2021-03-30 DIAGNOSIS — F331 Major depressive disorder, recurrent, moderate: Secondary | ICD-10-CM

## 2021-03-30 MED ORDER — FLUOXETINE HCL 20 MG PO CAPS: 20.0000 mg | ORAL_CAPSULE | Freq: Every day | ORAL | 1 refills | Status: DC

## 2021-03-30 MED ORDER — QUETIAPINE FUMARATE 200 MG PO TABS: 200.0000 mg | ORAL_TABLET | Freq: Every day | ORAL | 1 refills | Status: DC
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
## 2021-03-31 ENCOUNTER — Ambulatory Visit (HOSPITAL_COMMUNITY): Payer: 59 | Admitting: Licensed Clinical Social Worker

## 2021-03-31 NOTE — Progress Notes (Signed)
Therapist contacted patient by email and text and she did not respond session is a no show.

## 2021-04-02 ENCOUNTER — Emergency Department (HOSPITAL_BASED_OUTPATIENT_CLINIC_OR_DEPARTMENT_OTHER)
Admission: EM | Admit: 2021-04-02 | Discharge: 2021-04-02 | Disposition: A | Discharge status: Home / Self Care | Payer: 59 | Attending: Emergency Medicine | Admitting: Emergency Medicine

## 2021-04-02 ENCOUNTER — Other Ambulatory Visit: Payer: Self-pay

## 2021-04-02 ENCOUNTER — Encounter (HOSPITAL_BASED_OUTPATIENT_CLINIC_OR_DEPARTMENT_OTHER): Payer: Self-pay

## 2021-04-02 DIAGNOSIS — R42 Dizziness and giddiness: Secondary | ICD-10-CM | POA: Diagnosis present

## 2021-04-02 DIAGNOSIS — Z5321 Procedure and treatment not carried out due to patient leaving prior to being seen by health care provider: Secondary | ICD-10-CM | POA: Insufficient documentation

## 2021-04-02 DIAGNOSIS — R630 Anorexia: Secondary | ICD-10-CM | POA: Insufficient documentation

## 2021-04-02 DIAGNOSIS — R197 Diarrhea, unspecified: Secondary | ICD-10-CM | POA: Insufficient documentation

## 2021-04-02 LAB — COMPREHENSIVE METABOLIC PANEL
ALT: 17 U/L (ref 0–44)
AST: 17 U/L (ref 15–41)
Albumin: 3.7 g/dL (ref 3.5–5.0)
Alkaline Phosphatase: 66 U/L (ref 38–126)
Anion gap: 9 (ref 5–15)
BUN: 8 mg/dL (ref 6–20)
CO2: 26 mmol/L (ref 22–32)
Calcium: 8.9 mg/dL (ref 8.9–10.3)
Chloride: 103 mmol/L (ref 98–111)
Creatinine, Ser: 0.74 mg/dL (ref 0.44–1.00)
GFR, Estimated: >60 mL/min (ref >60)
Glucose, Bld: 125 mg/dL — ABNORMAL HIGH (ref 70–99)
Potassium: 3.7 mmol/L (ref 3.5–5.1)
Sodium: 138 mmol/L (ref 135–145)
Total Bilirubin: 0.3 mg/dL (ref 0.3–1.2)
Total Protein: 6.2 g/dL — ABNORMAL LOW (ref 6.5–8.1)

## 2021-04-02 LAB — URINALYSIS, ROUTINE W REFLEX MICROSCOPIC
Bilirubin Urine: NEGATIVE
Glucose, UA: NEGATIVE mg/dL
Hgb urine dipstick: NEGATIVE
Ketones, ur: NEGATIVE mg/dL
Leukocytes,Ua: NEGATIVE
Nitrite: NEGATIVE
Protein, ur: NEGATIVE mg/dL
Specific Gravity, Urine: 1.025 (ref 1.005–1.030)
pH: 6 (ref 5.0–8.0)

## 2021-04-02 LAB — CBC
HCT: 42.9 % (ref 36.0–46.0)
Hemoglobin: 14.4 g/dL (ref 12.0–15.0)
MCH: 29.8 pg (ref 26.0–34.0)
MCHC: 33.6 g/dL (ref 30.0–36.0)
MCV: 88.6 fL (ref 80.0–100.0)
Platelets: 361 10*3/uL (ref 150–400)
RBC: 4.84 MIL/uL (ref 3.87–5.11)
RDW: 12.4 % (ref 11.5–15.5)
WBC: 9 10*3/uL (ref 4.0–10.5)
nRBC: 0 % (ref 0.0–0.2)

## 2021-04-02 LAB — PREGNANCY, URINE: Preg Test, Ur: NEGATIVE

## 2021-04-02 LAB — LIPASE, BLOOD: Lipase: 28 U/L (ref 11–51)

## 2021-04-02 NOTE — ED Triage Notes (Addendum)
Pt c/o dizziness, diarrhea, decreased appetite x 3 days-NAD-slow gait

## 2021-04-04 ENCOUNTER — Emergency Department (HOSPITAL_BASED_OUTPATIENT_CLINIC_OR_DEPARTMENT_OTHER): Payer: 59

## 2021-04-04 ENCOUNTER — Other Ambulatory Visit: Payer: Self-pay

## 2021-04-04 ENCOUNTER — Emergency Department (HOSPITAL_BASED_OUTPATIENT_CLINIC_OR_DEPARTMENT_OTHER)
Admission: EM | Admit: 2021-04-04 | Discharge: 2021-04-04 | Disposition: A | Discharge status: Home / Self Care | Payer: 59 | Attending: Student | Admitting: Student

## 2021-04-04 ENCOUNTER — Encounter (HOSPITAL_BASED_OUTPATIENT_CLINIC_OR_DEPARTMENT_OTHER): Payer: Self-pay | Admitting: Emergency Medicine

## 2021-04-04 DIAGNOSIS — R42 Dizziness and giddiness: Secondary | ICD-10-CM | POA: Insufficient documentation

## 2021-04-04 DIAGNOSIS — Z20822 Contact with and (suspected) exposure to covid-19: Secondary | ICD-10-CM | POA: Insufficient documentation

## 2021-04-04 DIAGNOSIS — K0889 Other specified disorders of teeth and supporting structures: Secondary | ICD-10-CM | POA: Diagnosis present

## 2021-04-04 DIAGNOSIS — I10 Essential (primary) hypertension: Secondary | ICD-10-CM | POA: Insufficient documentation

## 2021-04-04 DIAGNOSIS — R197 Diarrhea, unspecified: Secondary | ICD-10-CM | POA: Insufficient documentation

## 2021-04-04 DIAGNOSIS — H538 Other visual disturbances: Secondary | ICD-10-CM | POA: Diagnosis not present

## 2021-04-04 DIAGNOSIS — K029 Dental caries, unspecified: Secondary | ICD-10-CM | POA: Diagnosis not present

## 2021-04-04 DIAGNOSIS — Z79899 Other long term (current) drug therapy: Secondary | ICD-10-CM | POA: Diagnosis not present

## 2021-04-04 DIAGNOSIS — R519 Headache, unspecified: Secondary | ICD-10-CM | POA: Diagnosis not present

## 2021-04-04 DIAGNOSIS — F17210 Nicotine dependence, cigarettes, uncomplicated: Secondary | ICD-10-CM | POA: Diagnosis not present

## 2021-04-04 LAB — PREGNANCY, URINE: Preg Test, Ur: NEGATIVE

## 2021-04-04 LAB — COMPREHENSIVE METABOLIC PANEL
ALT: 15 U/L (ref 0–44)
AST: 14 U/L — ABNORMAL LOW (ref 15–41)
Albumin: 4.7 g/dL (ref 3.5–5.0)
Alkaline Phosphatase: 74 U/L (ref 38–126)
Anion gap: 9 (ref 5–15)
BUN: 10 mg/dL (ref 6–20)
CO2: 29 mmol/L (ref 22–32)
Calcium: 9.8 mg/dL (ref 8.9–10.3)
Chloride: 99 mmol/L (ref 98–111)
Creatinine, Ser: 0.69 mg/dL (ref 0.44–1.00)
GFR, Estimated: >60 mL/min (ref >60)
Glucose, Bld: 120 mg/dL — ABNORMAL HIGH (ref 70–99)
Potassium: 3.4 mmol/L — ABNORMAL LOW (ref 3.5–5.1)
Sodium: 137 mmol/L (ref 135–145)
Total Bilirubin: 0.6 mg/dL (ref 0.3–1.2)
Total Protein: 7 g/dL (ref 6.5–8.1)

## 2021-04-04 LAB — CBC WITH DIFFERENTIAL/PLATELET
Abs Immature Granulocytes: 0.05 10*3/uL (ref 0.00–0.07)
Basophils Absolute: 0.1 10*3/uL (ref 0.0–0.1)
Basophils Relative: 0 %
Eosinophils Absolute: 0 10*3/uL (ref 0.0–0.5)
Eosinophils Relative: 0 %
HCT: 44.6 % (ref 36.0–46.0)
Hemoglobin: 14.9 g/dL (ref 12.0–15.0)
Immature Granulocytes: 0 %
Lymphocytes Relative: 16 %
Lymphs Abs: 2.2 10*3/uL (ref 0.7–4.0)
MCH: 29.3 pg (ref 26.0–34.0)
MCHC: 33.4 g/dL (ref 30.0–36.0)
MCV: 87.6 fL (ref 80.0–100.0)
Monocytes Absolute: 0.6 10*3/uL (ref 0.1–1.0)
Monocytes Relative: 5 %
Neutro Abs: 10.6 10*3/uL — ABNORMAL HIGH (ref 1.7–7.7)
Neutrophils Relative %: 79 %
Platelets: 382 10*3/uL (ref 150–400)
RBC: 5.09 MIL/uL (ref 3.87–5.11)
RDW: 12.4 % (ref 11.5–15.5)
WBC: 13.6 10*3/uL — ABNORMAL HIGH (ref 4.0–10.5)
nRBC: 0 % (ref 0.0–0.2)

## 2021-04-04 LAB — URINALYSIS, ROUTINE W REFLEX MICROSCOPIC
Bilirubin Urine: NEGATIVE
Glucose, UA: NEGATIVE mg/dL
Hgb urine dipstick: NEGATIVE
Ketones, ur: NEGATIVE mg/dL
Leukocytes,Ua: NEGATIVE
Nitrite: NEGATIVE
Specific Gravity, Urine: 1.018 (ref 1.005–1.030)
pH: 6.5 (ref 5.0–8.0)

## 2021-04-04 LAB — RESP PANEL BY RT-PCR (FLU A&B, COVID) ARPGX2
Influenza A by PCR: NEGATIVE
Influenza B by PCR: NEGATIVE
SARS Coronavirus 2 by RT PCR: NEGATIVE

## 2021-04-04 LAB — LIPASE, BLOOD: Lipase: 17 U/L (ref 11–51)

## 2021-04-04 LAB — TSH: TSH: 2.028 u[IU]/mL (ref 0.350–4.500)

## 2021-04-04 IMAGING — CT CT HEAD W/O CM
4 series · 17 of 47 positions shown, 19 images · non-contrast
Comparison: None.

CLINICAL DATA: Headache, new or worsening, neuro deficit (Age
19-49y)

EXAM:
CT HEAD WITHOUT CONTRAST
TECHNIQUE: Contiguous axial images were obtained from the base of the skull
through the vertex without intravenous contrast.

[Series 2: head wo · axial · 0.41mm/px · z∈[-142,-32]mm · 7 of 30 slices shown, 9 images]
[im 4/30  brain]
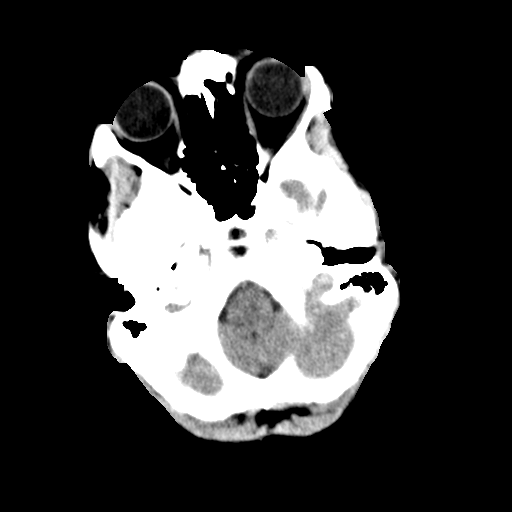
[im 4/30  bone]
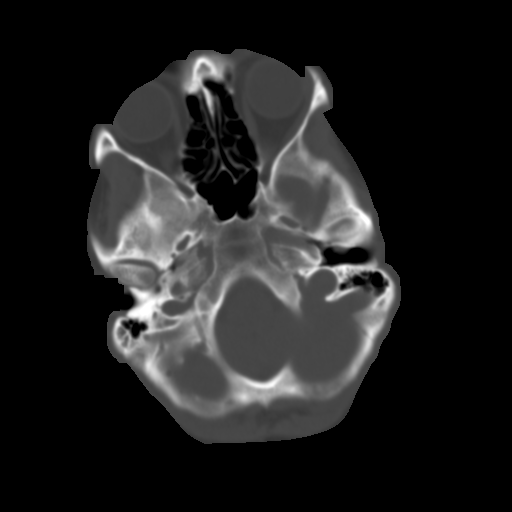
[im 8/30  brain]
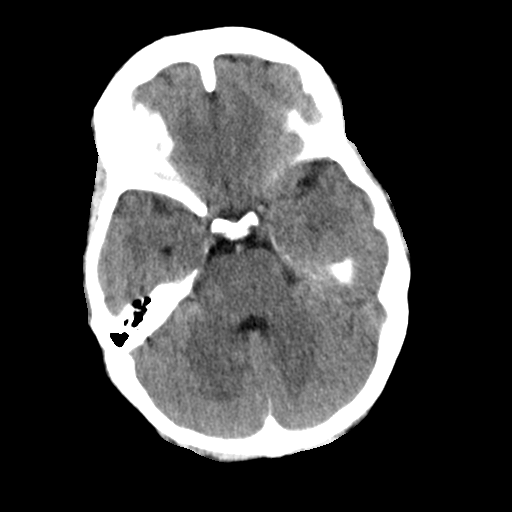
[im 11/30  brain]
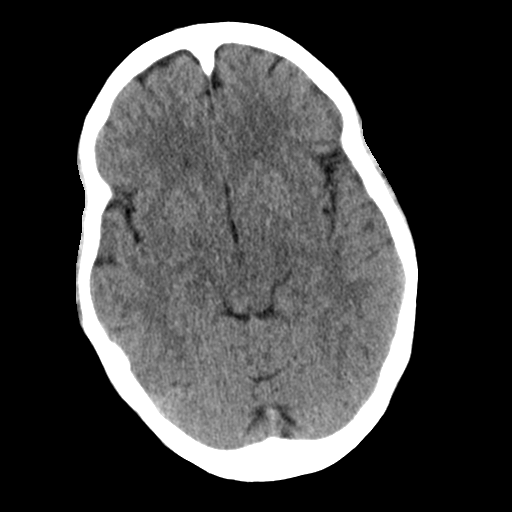
[im 15/30  brain]
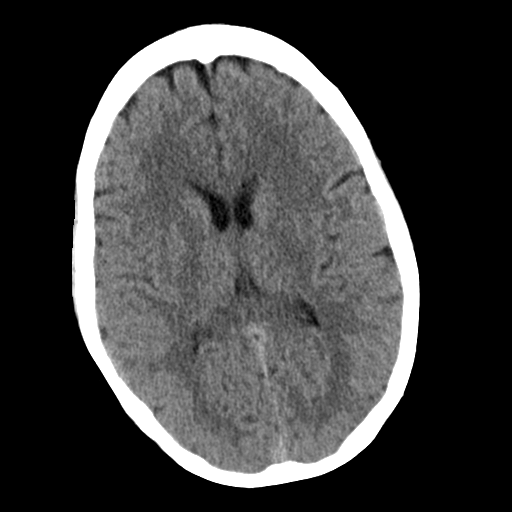
[im 19/30  brain]
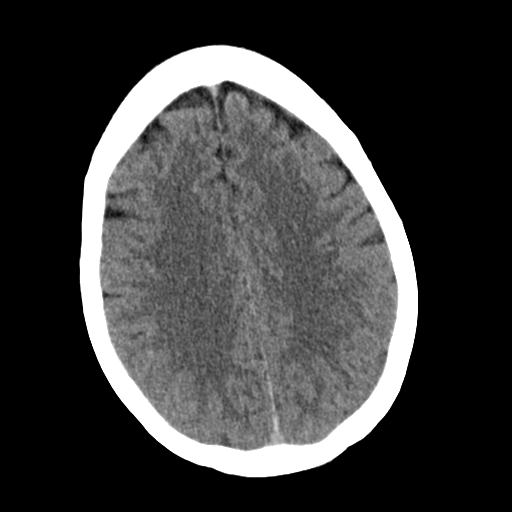
[im 19/30  bone]
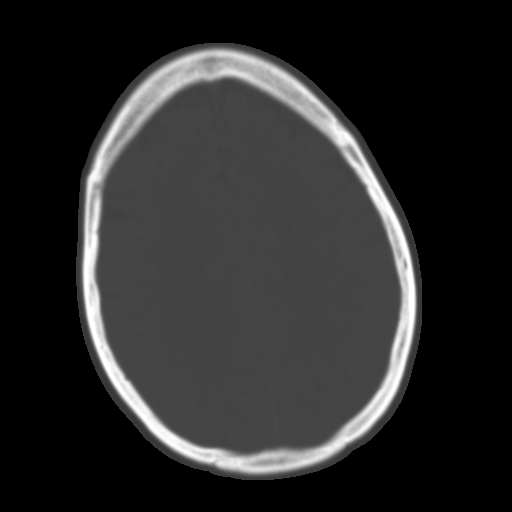
[im 22/30  brain]
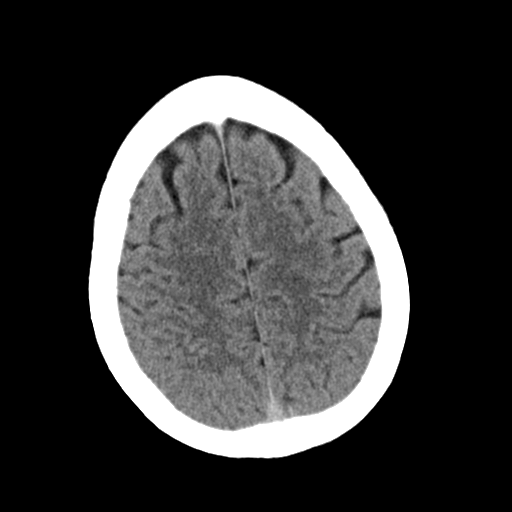
[im 26/30  brain]
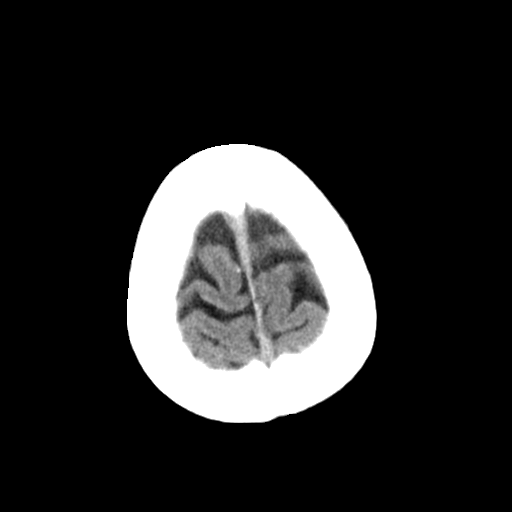

[Series 3: head bone · axial · 0.41mm/px · z∈[-143,-91]mm · 4 of 75 slices shown]
[im 8/75  bone]
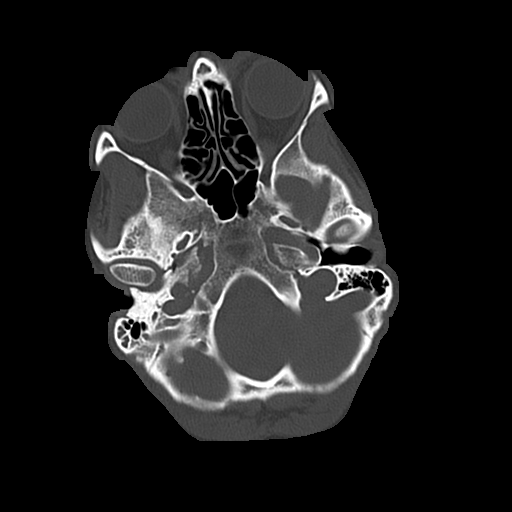
[im 15/75  bone]
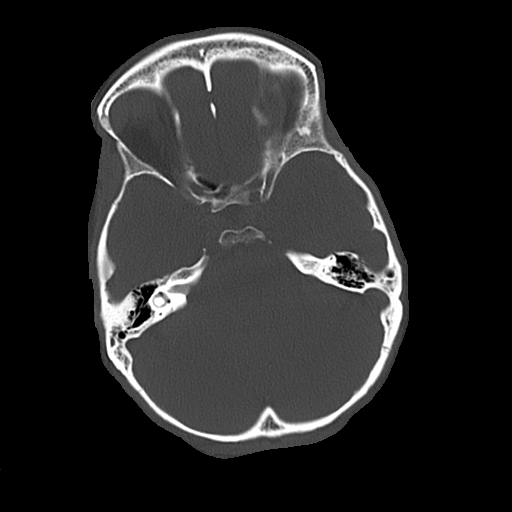
[im 23/75  bone]
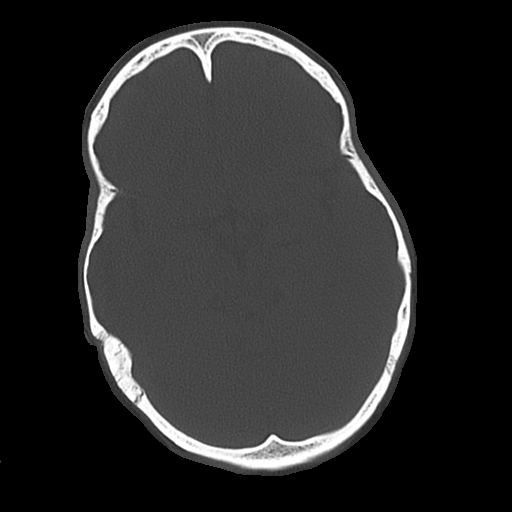
[im 34/75  bone]
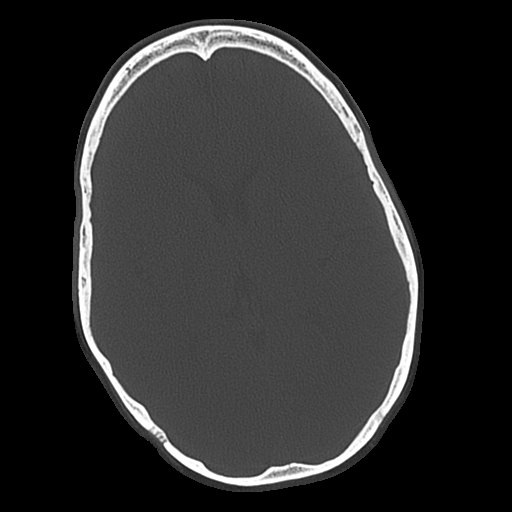

[Series 4: coronal soft · coronal · 0.30mm/px · 3 of 65 slices shown]
[im 22/65  brain]
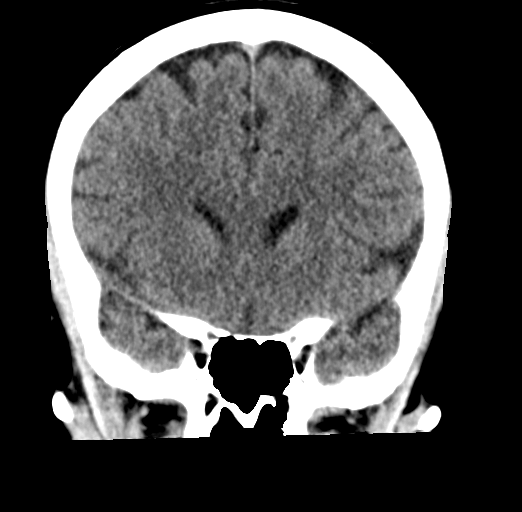
[im 29/65  brain]
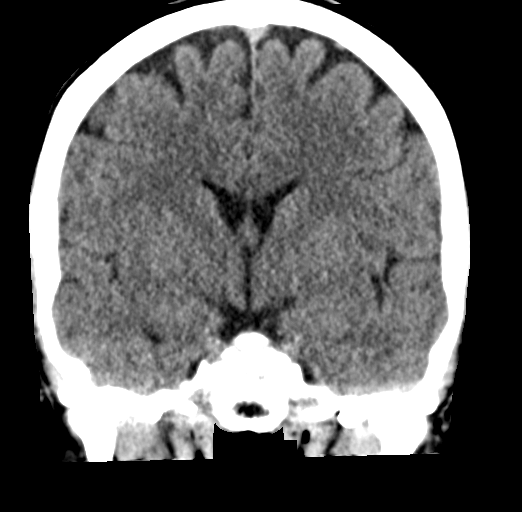
[im 36/65  brain]
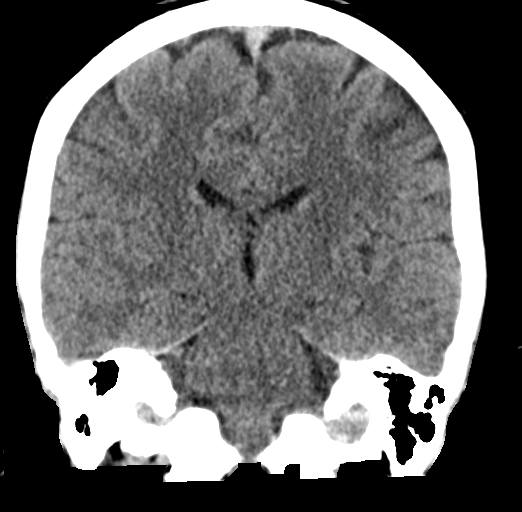

[Series 5: sagittal soft · sagittal · 0.30mm/px · 3 of 50 slices shown]
[im 17/50  brain]
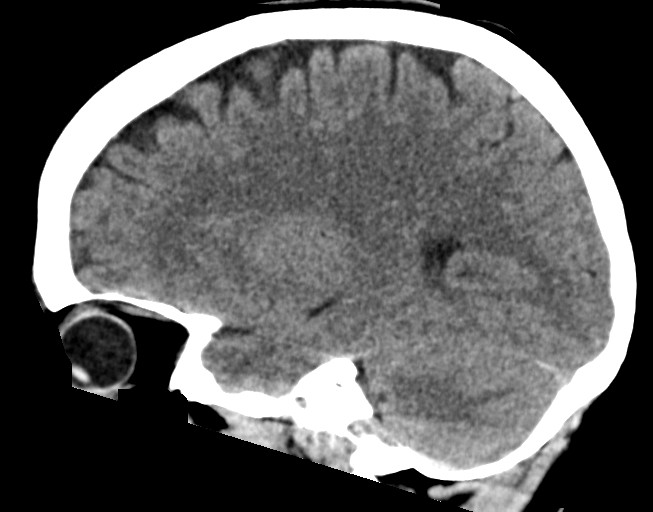
[im 25/50  brain]
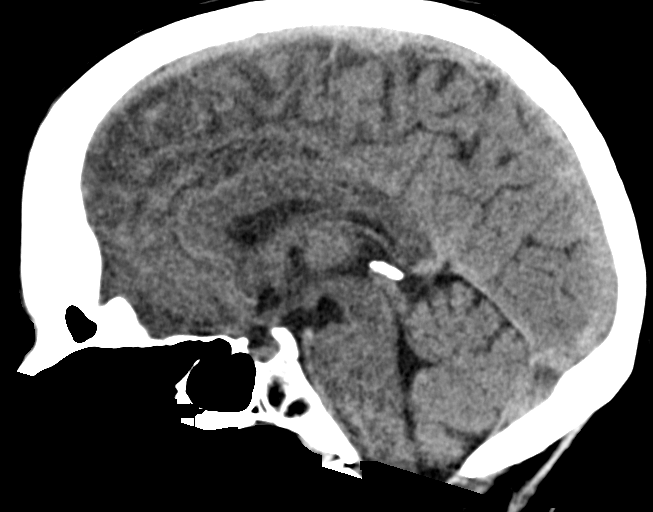
[im 33/50  brain]
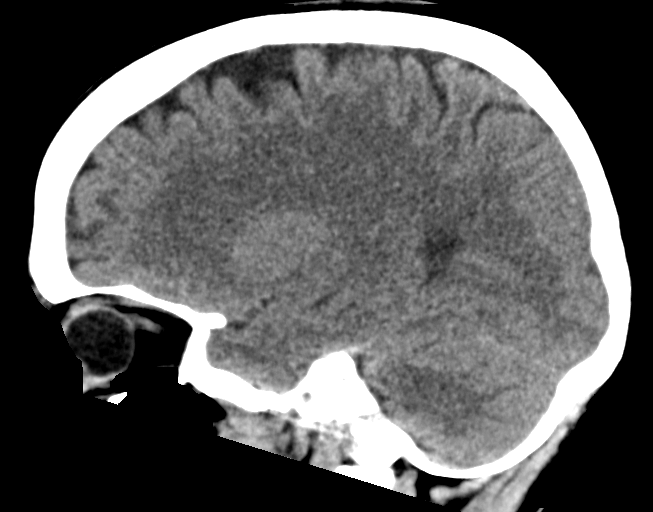

[17 of 47 positions shown; findings below may reference images not displayed]

FINDINGS: Brain:

No evidence of large-territorial acute infarction. No parenchymal
hemorrhage. No mass lesion. No extra-axial collection.

No mass effect or midline shift. No hydrocephalus. Basilar cisterns
are patent.

Vascular: No hyperdense vessel.

Skull: No acute fracture or focal lesion.

Sinuses/Orbits: Paranasal sinuses and mastoid air cells are clear.
The orbits are unremarkable.

Other: None.
IMPRESSION: No acute intracranial abnormality.

## 2021-04-04 MED ORDER — PROCHLORPERAZINE EDISYLATE 10 MG/2ML IJ SOLN
10.0000 mg | Freq: Once | INTRAMUSCULAR | Status: AC
  Administered 2021-04-04: 17:00:00 10 mg via INTRAVENOUS
  Filled 2021-04-04: qty 2

## 2021-04-04 MED ORDER — DIPHENHYDRAMINE HCL 50 MG/ML IJ SOLN
25.0000 mg | Freq: Once | INTRAMUSCULAR | Status: AC
  Administered 2021-04-04: 17:00:00 25 mg via INTRAVENOUS
  Filled 2021-04-04: qty 1

## 2021-04-04 MED ORDER — AMOXICILLIN-POT CLAVULANATE 875-125 MG PO TABS: 1.0000 | ORAL_TABLET | Freq: Two times a day (BID) | ORAL | 0 refills | Status: DC

## 2021-04-04 MED ORDER — MORPHINE SULFATE (PF) 2 MG/ML IV SOLN
2.0000 mg | Freq: Once | INTRAVENOUS | Status: AC
  Administered 2021-04-04: 17:00:00 2 mg via INTRAVENOUS
  Filled 2021-04-04: qty 1

## 2021-04-04 MED ORDER — KETOROLAC TROMETHAMINE 15 MG/ML IJ SOLN
15.0000 mg | Freq: Once | INTRAMUSCULAR | Status: AC
  Administered 2021-04-04: 17:00:00 15 mg via INTRAVENOUS
  Filled 2021-04-04: qty 1

## 2021-04-04 MED ORDER — SODIUM CHLORIDE 0.9 % IV BOLUS
1000.0000 mL | Freq: Once | INTRAVENOUS | Status: AC
  Administered 2021-04-04: 17:00:00 1000 mL via INTRAVENOUS

## 2021-04-04 NOTE — ED Notes (Signed)
Patient comfortable at this Time.

## 2021-04-04 NOTE — ED Provider Notes (Signed)
[facility] EMERGENCY DEPT Provider Note   CSN: [id number] Arrival date & time: 04/04/21  1206     History Chief Complaint  Patient presents with   Dizziness    Alice Martinez is a 45 y.o. female with past medical history significant for ED, depression, substance abuse, PTSD who presents with 1 week of headache, dizziness, blurry vision, diarrhea. Patient describes her headache as intense pressure, that came on gradually, is centered behind the eyes, 8/10 in severity.  He recently had a significant dental procedure with removal of an infected tooth on the right side, and subsequent pain, swelling, potential infection.  Patient reports that she had 4 days of antibiotics which she has been taking by splitting them in half and taking 1 a day for the last 4 days.  Scribes her diarrhea as green, watery, denies any blood.  Patient denies significant abdominal cramping.  Patient also endorses that she has been dizzy on her feet, with balance issues, feeling like she is going to fall, and some minor stumbles over the last week.  Patient presented to med [facility] 2 days ago, screening blood work and urinalysis done, however patient left without being seen.  Has history of stroke, heart attack, diabetes, peripheral vascular disease.  Patient denies any numbness or tingling throughout the entire body.  Denies dysuria, urinary frequency, hematuria.   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     Family History  Family history unknown: Yes    Social History   Tobacco Use   Smoking status: Every Day    Packs/day: 0.50    Types: Cigarettes   Smokeless tobacco: Never  Vaping Use   Vaping Use: Never used  Substance Use Topics   Alcohol use: Not Currently   Drug use: No    Home Medications Prior to Admission medications   Medication Sig Start Date End Date Taking? Authorizing Provider  amoxicillin-clavulanate (AUGMENTIN) 875-125 MG tablet Take 1 tablet by mouth every 12 (twelve) hours. 04/04/21  Yes Kanon Novosel H, PA-C  dicyclomine (BENTYL) 20 MG tablet Take 1 tablet (20 mg total) by mouth 2 (two) times daily for 15 days. For stomach cramping and upset 09/15/20 09/30/20  Terald Sleeper, MD  FLUoxetine (PROZAC) 20 MG capsule Take 1 capsule (20 mg total) by mouth daily. 03/30/21   Thresa Ross, MD  hydrochlorothiazide (HYDRODIURIL) 25 MG tablet Take 25 mg by mouth daily. 01/28/19   [provider]  levothyroxine (SYNTHROID) 25 MCG tablet Take by mouth.  09/11/20   [provider]  potassium chloride (KLOR-CON) 10 MEQ tablet Take 1 tablet (10 mEq total) by mouth daily. 09/15/20 10/15/20  Wyvonnia Dusky, MD  pravastatin (PRAVACHOL) 20 MG tablet Take 1 tablet (20 mg total) by mouth daily at 6 PM. 06/09/20   Ethelene Hal, NP  QUEtiapine (SEROQUEL) 200 MG tablet Take 1 tablet (200 mg total) by mouth at bedtime. 03/30/21   Merian Capron, MD  Vitamin D, Ergocalciferol, (DRISDOL) 1.25 MG  (50000 UNIT) CAPS capsule Take 1 capsule (50,000 Units total) by mouth every 7 (seven) days. 06/15/20   Ethelene Hal, NP    Allergies    Patient has no known allergies.  Review of Systems   Review of Systems  HENT:  Positive for dental problem.   Gastrointestinal:  Positive for diarrhea.  Neurological:  Positive for dizziness and headaches.  All other systems reviewed and are negative.  Physical Exam Updated Vital Signs BP 112/65 (BP Location: Right Arm)   Pulse 82   Temp 97.9 F (36.6 C) (Oral)   Resp 12   Ht 5\' 1"  (1.549 m)   Wt 58.5 kg   LMP 03/27/2021 (Exact Date)   SpO2 100%   BMI 24.37 kg/m   Physical Exam Vitals and nursing note reviewed.  Constitutional:      General: She is not in acute distress.    Appearance: Normal appearance.  HENT:     Head: Normocephalic and atraumatic.     Mouth/Throat:     Comments: Poor dentition throughout, with significant dental caries noted on the right side.  There is no significant dental abscess, or floor of mouth tenderness distal just Ludwig angina. Eyes:     General:        Right eye: No discharge.        Left eye: No discharge.  Neck:     Comments: Palpable rubbery cervical adenopathy in the anterior chain on the right side Cardiovascular:     Rate and Rhythm: Normal rate and regular rhythm.     Heart sounds: No murmur heard.   No friction rub. No gallop.  Pulmonary:     Effort: Pulmonary effort is normal.     Breath sounds: Normal breath sounds.  Abdominal:     General: Bowel sounds are normal.     Palpations: Abdomen is soft.     Comments: Some tenderness palpation suprapubically, no CVA tenderness.  Musculoskeletal:     Cervical back: Neck supple. No rigidity.  Skin:    General: Skin is warm and dry.     Capillary Refill: Capillary refill takes less than 2 seconds.  Neurological:     Mental Status: She is alert and oriented to person, place, and time.     Comments: CN III through XII grossly intact.   Intact finger-nose, intact heel-to-shin.  Alert and oriented x3.  Romberg negative, gait is somewhat wobbly without clear pathologic pattern.  Intact strength 5 out of 5 bilateral upper and lower extremities.  Psychiatric:        Mood and Affect: Mood normal.        Behavior: Behavior normal.    ED Results / Procedures / Treatments   Labs (all labs ordered are listed, but only abnormal results are displayed) Labs Reviewed  CBC WITH DIFFERENTIAL/PLATELET - Abnormal; Notable for the following components:      Result Value   WBC 13.6 (*)    Neutro Abs 10.6 (*)    All other  components within normal limits  COMPREHENSIVE METABOLIC PANEL - Abnormal; Notable for the following components:   Potassium 3.4 (*)    Glucose, Bld 120 (*)    AST 14 (*)    All other components within normal limits  URINALYSIS, ROUTINE W REFLEX MICROSCOPIC - Abnormal; Notable for the following components:   Protein, ur TRACE (*)    All other components within normal limits  RESP PANEL BY RT-PCR (FLU A&B, COVID) ARPGX2  TSH  LIPASE, BLOOD  PREGNANCY, URINE    EKG None  Radiology CT Head Wo Contrast  Result Date: 04/04/2021 CLINICAL DATA:  Headache, new or worsening, neuro deficit (Age 32-49y) EXAM: CT HEAD WITHOUT CONTRAST TECHNIQUE: Contiguous axial images were obtained from the base of the skull through the vertex without intravenous contrast. COMPARISON:  None. FINDINGS: Brain: No evidence of large-territorial acute infarction. No parenchymal hemorrhage. No mass lesion. No extra-axial collection. No mass effect or midline shift. No hydrocephalus. Basilar cisterns are patent. Vascular: No hyperdense vessel. Skull: No acute fracture or focal lesion. Sinuses/Orbits: Paranasal sinuses and mastoid air cells are clear. The orbits are unremarkable. Other: None. IMPRESSION: No acute intracranial abnormality. Electronically Signed   By: Iven Finn M.D.   On: 04/04/2021 16:17    Procedures Procedures    Medications Ordered in ED Medications  sodium chloride 0.9 % bolus 1,000 mL (0 mLs Intravenous Stopped 04/04/21 1757)  ketorolac (TORADOL) 15 MG/ML injection 15 mg (15 mg Intravenous Given 04/04/21 1652)  diphenhydrAMINE (BENADRYL) injection 25 mg (25 mg Intravenous Given 04/04/21 1648)  prochlorperazine (COMPAZINE) injection 10 mg (10 mg Intravenous Given 04/04/21 1651)  morphine 2 MG/ML injection 2 mg (2 mg Intravenous Given 04/04/21 1653)    ED Course  I have reviewed the triage vital signs and the nursing notes.  Pertinent labs & imaging results that were available during my care of the patient were reviewed by me and considered in my medical decision making (see chart for details).    MDM Rules/Calculators/A&P                          Overall well-appearing patient with multiple complaints including weakness, dizziness, diarrhea, headache.  Patient with stable vital signs throughout her visit.  Patient with description of presumed infectious diarrhea, nonperitonitic abdominal exam.  Patient with no focal neurodeficits other than some gait imbalance.  CT head does not show any intracranial abnormality.  Screening lab work without significant abnormality including CBC, CMP, UA, lipase, U pregnant, respiratory virus panel, TSH.  Patient does have a minimally elevated white count with some left shift.  Given patient's weakness, balance issues some concern for intracranial abnormality, however CT head without any evidence of intracranial mass or stroke.  We will administer migraine cocktail with to help with patient's decreased fluid status, dry mucous membranes, as well as to help with her headache.  Patient's headache is resolved with migraine cocktail, however she continues to report some pain in the right jaw.  There is noted dental cary burden on the right side without any evidence of dental abscess, peritonsillar abscess, Ludwig angina.  There is no swelling of the face, buccal  mucosa.  Minimal clinical concern for acute or surgical abdomen at this time.  Minimal clinical concern for red flag causes of headache.  Patient without meningeal signs, no neck tenderness, stiffness.  Patient is afebrile.  Patient without any focal neurologic deficit, and clear CT scan.  Patient with resolution of headache  with migraine cocktail.  Discussed patient's white count is likely secondary to her ongoing dental pain, with possible infection.  Given that she inappropriately took an incomplete course of antibiotics on her own without input from a physician, it is possible that she has some continuing bacterial burden in the right jaw associated with her dental disease.  Will prescribe Augmentin.  Follow-up with dentistry, PCP for further evaluation.  Patient discharged in stable condition at this time, return precautions given. Final Clinical Impression(s) / ED Diagnoses Final diagnoses:  Pain due to dental caries  Diarrhea of presumed infectious origin  Acute nonintractable headache, unspecified headache type    Rx / DC Orders ED Discharge Orders          Ordered    amoxicillin-clavulanate (AUGMENTIN) 875-125 MG tablet  Every 12 hours        04/04/21 1830             Jalyric Kaestner, Jackalyn Lombard 04/04/21 Vincente Liberty, MD 04/07/21 1023

## 2021-04-04 NOTE — ED Notes (Signed)
Pt checked in at [facility] 2 days ago, blood work & urinalysis done, LWBS

## 2021-04-04 NOTE — ED Notes (Signed)
Covid swab collected and sent to lab.

## 2021-04-04 NOTE — ED Notes (Signed)
RN provided AVS using Teachback Method. Patient verbalizes understanding of Discharge Instructions. Opportunity for Questioning and Answers were provided by RN. Patient Discharged from ED ambulatory to home via Self. 

## 2021-04-04 NOTE — Discharge Instructions (Signed)
Please take the entire course of antibiotics that I prescribed, and follow up with dentistry as we discussed. I recommend increasing fluid intake, take ibuprofen / tylenol as needed for headaches. Follow up with your PCP.

## 2021-04-04 NOTE — ED Triage Notes (Signed)
Pt reports headaches and pressure since Monday and feels unsteady on her feet since.  Pt reports taking goody's powder and advil at home with minimal relief.

## 2021-04-04 NOTE — ED Notes (Signed)
Patient transported to and from CT at this Time.

## 2021-04-07 ENCOUNTER — Ambulatory Visit (HOSPITAL_COMMUNITY): Payer: 59 | Admitting: Licensed Clinical Social Worker

## 2021-04-07 NOTE — Progress Notes (Signed)
Therapist contacted patient by email and text and she did not respond session is a no show.  

## 2021-04-14 ENCOUNTER — Ambulatory Visit (HOSPITAL_COMMUNITY): Payer: 59 | Admitting: Licensed Clinical Social Worker

## 2021-04-14 NOTE — Progress Notes (Signed)
Therapist contacted patient through text and email and she did not respond. Session is a no show.

## 2021-04-15 ENCOUNTER — Encounter (HOSPITAL_COMMUNITY): Payer: Self-pay | Admitting: Licensed Clinical Social Worker

## 2021-04-21 ENCOUNTER — Ambulatory Visit (HOSPITAL_COMMUNITY): Payer: 59 | Admitting: Licensed Clinical Social Worker

## 2021-04-28 ENCOUNTER — Other Ambulatory Visit (HOSPITAL_COMMUNITY): Payer: Self-pay | Admitting: Psychiatry

## 2021-04-28 ENCOUNTER — Ambulatory Visit (HOSPITAL_COMMUNITY): Payer: 59 | Admitting: Licensed Clinical Social Worker

## 2021-04-28 DIAGNOSIS — F4310 Post-traumatic stress disorder, unspecified: Secondary | ICD-10-CM

## 2021-04-28 DIAGNOSIS — F319 Bipolar disorder, unspecified: Secondary | ICD-10-CM

## 2021-04-28 DIAGNOSIS — F331 Major depressive disorder, recurrent, moderate: Secondary | ICD-10-CM

## 2021-05-05 ENCOUNTER — Ambulatory Visit (INDEPENDENT_AMBULATORY_CARE_PROVIDER_SITE_OTHER): Payer: 59 | Admitting: Licensed Clinical Social Worker

## 2021-05-05 ENCOUNTER — Encounter (HOSPITAL_COMMUNITY): Payer: Self-pay

## 2021-05-05 DIAGNOSIS — F411 Generalized anxiety disorder: Secondary | ICD-10-CM

## 2021-05-05 DIAGNOSIS — F319 Bipolar disorder, unspecified: Secondary | ICD-10-CM

## 2021-05-05 NOTE — Plan of Care (Signed)
°  Problem: Anxiety Disorder CCP Problem  1 Coping Goal:  Lorely will manage anxiety and mood as evidenced by coping with things beyond her control, coping with daily stressors, and regulating sleep for 5 out of 7 days for 60 days.  Outcome: Not Progressing Goal: STG: Patient will practice problem solving skills 3 times per week for the next 4 weeks Outcome: Not Progressing Goal: STG: Patient will reduce frequency of avoidant behaviors by 50% as evidenced by self-report in therapy sessions Outcome: Not Progressing
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
## 2021-05-13 NOTE — Progress Notes (Deleted)
[facility] Clinic Note - Initial Visit   Date: 05/13/21  JULIANIE SINNETT MRN: [id number] DOB: [birth year]   Dear Dr Marland KitchenCarolee Rota, NP:  Thank you for your kind referral of Edmonia Caprio for consultation of ***. Although her history is well known to you, please allow [place] to reiterate it for the purpose of our medical record. The patient was accompanied to the clinic by *** who also provides collateral information.     History of Present Illness: Alice Martinez is a 46 y.o. ***-handed female with bipolar disorder,ADHD, hypertension, hypothyroidism, and tobacco use presenting for evaluation of neuropathy.    ***  Out-side paper records, electronic medical record, and images have been reviewed where available and summarized as: ***  MRI brain wo contrast 04/16/2020:  Normal  MRI lumbar spine wo contrast 04/16/2020: 1. Positive for right side lumbosacral Acute Myositis. Trace contralateral left myositis at the L3 level. This is nonspecific with regard to etiology. There is no acute osseous abnormality, no intramuscular fluid collection, and the other paraspinal soft tissues remain normal.   2. No lumbar disc degeneration, spinal stenosis or neural impingement. Up to moderate lower lumbar facet hypertrophy.  MRI lumbar spine wo contrast 04/22/2020: 1. Improving right greater than left paraspinal muscle edema. 2. No acute abnormality or osteomyelitis-discitis.  Routine EEG 04/22/2020: normal   Lab Results  Component Value Date   HGBA1C 5.7 (H) 06/07/2020   No results found for: VITAMINB12 Lab Results  Component Value Date   TSH 2.028 04/04/2021   No results found for: ESRSEDRATE, POCTSEDRATE  Past Medical History:  Diagnosis Date   ADHD (attention deficit hyperactivity disorder)    Depression    High cholesterol    Hypertension    Thyroid disease     Past Surgical History:  Procedure Laterality Date   TONSILLECTOMY     TUBAL  LIGATION       Medications:  Outpatient Encounter Medications as of 05/14/2021  Medication Sig   amoxicillin-clavulanate (AUGMENTIN) 875-125 MG tablet Take 1 tablet by mouth every 12 (twelve) hours.   dicyclomine (BENTYL) 20 MG tablet Take 1 tablet (20 mg total) by mouth 2 (two) times daily for 15 days. For stomach cramping and upset   FLUoxetine (PROZAC) 20 MG capsule Take 1 capsule (20 mg total) by mouth daily.   hydrochlorothiazide (HYDRODIURIL) 25 MG tablet Take 25 mg by mouth daily.   levothyroxine (SYNTHROID) 25 MCG tablet Take by mouth.   potassium chloride (KLOR-CON) 10 MEQ tablet Take 1 tablet (10 mEq total) by mouth daily.   pravastatin (PRAVACHOL) 20 MG tablet Take 1 tablet (20 mg total) by mouth daily at 6 PM.   QUEtiapine (SEROQUEL) 200 MG tablet Take 1 tablet (200 mg total) by mouth at bedtime.   Vitamin D, Ergocalciferol, (DRISDOL) 1.25 MG (50000 UNIT) CAPS capsule Take 1 capsule (50,000 Units total) by mouth every 7 (seven) days.   No facility-administered encounter medications on file as of 05/14/2021.    Allergies: No Known Allergies  Family History: Family History  Family history unknown: Yes    Social History: Social History   Tobacco Use   Smoking status: Every Day    Packs/day: 0.50    Types: Cigarettes   Smokeless tobacco: Never  Vaping Use   Vaping Use: Never used  Substance Use Topics   Alcohol use: Not Currently   Drug use: No   Social History   Social History Narrative   Not on file  Vital Signs:  There were no vitals taken for this visit.   General Medical Exam:  *** General:  Well appearing, comfortable.   Eyes/ENT: see cranial nerve examination.   Neck:   No carotid bruits. Respiratory:  Clear to auscultation, good air entry bilaterally.   Cardiac:  Regular rate and rhythm, no murmur.   Extremities:  No deformities, edema, or skin discoloration.  Skin:  No rashes or lesions.  Neurological Exam: MENTAL STATUS including  orientation to time, place, person, recent and remote memory, attention span and concentration, language, and fund of knowledge is ***normal.  Speech is not dysarthric.  CRANIAL NERVES: II:  No visual field defects.  Unremarkable fundi.   III-IV-VI: Pupils equal round and reactive to light.  Normal conjugate, extra-ocular eye movements in all directions of gaze.  No nystagmus.  No ptosis***.   V:  Normal facial sensation.    VII:  Normal facial symmetry and movements.   VIII:  Normal hearing and vestibular function.   IX-X:  Normal palatal movement.   XI:  Normal shoulder shrug and head rotation.   XII:  Normal tongue strength and range of motion, no deviation or fasciculation.  MOTOR:  No atrophy, fasciculations or abnormal movements.  No pronator drift.   Upper Extremity:  Right  Left  Deltoid  5/5   5/5   Biceps  5/5   5/5   Triceps  5/5   5/5   Infraspinatus 5/5  5/5  Medial pectoralis 5/5  5/5  Wrist extensors  5/5   5/5   Wrist flexors  5/5   5/5   Finger extensors  5/5   5/5   Finger flexors  5/5   5/5   Dorsal interossei  5/5   5/5   Abductor pollicis  5/5   5/5   Tone (Ashworth scale)  0  0   Lower Extremity:  Right  Left  Hip flexors  5/5   5/5   Hip extensors  5/5   5/5   Adductor 5/5  5/5  Abductor 5/5  5/5  Knee flexors  5/5   5/5   Knee extensors  5/5   5/5   Dorsiflexors  5/5   5/5   Plantarflexors  5/5   5/5   Toe extensors  5/5   5/5   Toe flexors  5/5   5/5   Tone (Ashworth scale)  0  0   MSRs:  Right        Left                  brachioradialis 2+  2+  biceps 2+  2+  triceps 2+  2+  patellar 2+  2+  ankle jerk 2+  2+  Hoffman no  no  plantar response down  down   SENSORY:  Normal and symmetric perception of light touch, pinprick, vibration, and proprioception.  Romberg's sign absent.   COORDINATION/GAIT: Normal finger-to- nose-finger and heel-to-shin.  Intact rapid alternating movements bilaterally.  Able to rise from a chair without using arms.   Gait narrow based and stable. Tandem and stressed gait intact.    IMPRESSION: ***  PLAN/RECOMMENDATIONS:  *** Return to clinic in *** months.  Total time spent: ***   Thank you for allowing me to participate in patient's care.  If I can answer any additional questions, I would be pleased to do so.    Sincerely,    Farris Blash K. Posey Pronto, DO

## 2021-05-14 ENCOUNTER — Ambulatory Visit: Payer: Self-pay | Admitting: Neurology

## 2021-05-26 ENCOUNTER — Ambulatory Visit (INDEPENDENT_AMBULATORY_CARE_PROVIDER_SITE_OTHER): Payer: 59 | Admitting: Licensed Clinical Social Worker

## 2021-05-26 DIAGNOSIS — F411 Generalized anxiety disorder: Secondary | ICD-10-CM | POA: Diagnosis not present

## 2021-05-26 NOTE — Progress Notes (Signed)
Virtual Visit via Telephone Note  I connected with Alice Martinez on 05/26/21 at  1:00 PM EST by telephone and verified that I am speaking with the correct person using two identifiers.  Location: Patient: Home Provider: Office   I discussed the limitations, risks, security and privacy concerns of performing an evaluation and management service by telephone and the availability of in person appointments. I also discussed with the patient that there may be a patient responsible charge related to this service. The patient expressed understanding and agreed to proceed.   THERAPIST PROGRESS NOTE  Session Time: 1:00 pm-1:20 pm  Type of Therapy: Individual Therapy  Purpose of session: Alice Martinez will manage anxiety and mood as evidenced by coping with things beyond her control, coping with daily stressors, and regulating sleep for 5 out of 7 days for 60 days  Interventions: Therapist utilized CBT and Solution focused brief therapy to address anxiety and mood. Therapist provided support and empathy to patient during session. Therapist worked with patient to identify the major components of a recent episode of anxiety: physical symptoms, major thoughts and images, and major behaviors they experienced related to their worry over their daughter going into labor early. Therapist worked with patient on acceptance.  Effectiveness: Patient was oriented x5 (person, place, situation, time, and object). Patient was alert, engaged, worried, and cooperative. Patient noted that both her daughters went into labor. One daughter was supposed to give birth and the other went into labor at 80 weeks. Patient is watching her granddaughter so her daughter in the hospital doesn't have to worry. Patient is worried about what may happen to her daughter and the baby. Patient patient has had worse case scenario thoughts in her mind but is trying to take things one day at a time. Patient is focusing on what is in her control such as  taking care of her granddaughter, and checking in with her daughters.   Patient was engaged in session. Patient responded well to interventions. Patient continues to meet criteria for Bipolar 1 and Generalized Anxiety Disorder. Patient will continue in outpatient therapy due to being the least restrictive service to meet her needs. Patient made minimal progress on her goals at this time.    Suicidal/Homicidal: Negativewithout intent/plan  Plan: Return again in 1-2 weeks.  Diagnosis: Axis I: Bipolar, Depressed and Generalized Anxiety Disorder    Axis II: No diagnosis   I discussed the assessment and treatment plan with the patient. The patient was provided an opportunity to ask questions and all were answered. The patient agreed with the plan and demonstrated an understanding of the instructions.   The patient was advised to call back or seek an in-person evaluation if the symptoms worsen or if the condition fails to improve as anticipated.  I provided 20 minutes of non-face-to-face time during this encounter.   Alice Bickers, LCSW 05/26/2021

## 2021-06-02 ENCOUNTER — Telehealth (INDEPENDENT_AMBULATORY_CARE_PROVIDER_SITE_OTHER): Payer: 59 | Admitting: Psychiatry

## 2021-06-02 ENCOUNTER — Encounter (HOSPITAL_COMMUNITY): Payer: Self-pay | Admitting: Psychiatry

## 2021-06-02 DIAGNOSIS — F331 Major depressive disorder, recurrent, moderate: Secondary | ICD-10-CM

## 2021-06-02 DIAGNOSIS — F319 Bipolar disorder, unspecified: Secondary | ICD-10-CM

## 2021-06-02 DIAGNOSIS — F411 Generalized anxiety disorder: Secondary | ICD-10-CM

## 2021-06-02 DIAGNOSIS — F4310 Post-traumatic stress disorder, unspecified: Secondary | ICD-10-CM

## 2021-06-02 MED ORDER — FLUOXETINE HCL 20 MG PO CAPS: 20.0000 mg | ORAL_CAPSULE | Freq: Every day | ORAL | 1 refills | Status: DC

## 2021-06-02 MED ORDER — QUETIAPINE FUMARATE 200 MG PO TABS: 200.0000 mg | ORAL_TABLET | Freq: Every day | ORAL | 1 refills | Status: DC

## 2021-06-02 NOTE — Progress Notes (Signed)
[facility] Follow up visit  Patient Identification: Alice Martinez P Lovick MRN:  [id number] Date of Evaluation:  06/02/2021 Referral Source: primary care Chief Complaint:  follow up , depression/bipolar Visit Diagnosis:    ICD-10-CM   1. Bipolar I disorder (HCC)  F31.9 QUEtiapine (SEROQUEL) 200 MG tablet    2. PTSD (post-traumatic stress disorder)  F43.10 QUEtiapine (SEROQUEL) 200 MG tablet    3. GAD (generalized anxiety disorder)  F41.1     4. Major depressive disorder, recurrent episode, moderate (HCC)  F33.1 QUEtiapine (SEROQUEL) 200 MG tablet     Virtual Visit via Video Note  I connected with Whitney Postonya P Tetrick on 06/02/21 at  2:00 PM EST by a video enabled telemedicine application and verified that I am speaking with the correct person using two identifiers.  Location: Patient: parked car Provider: home office   I discussed the limitations of evaluation and management by telemedicine and the availability of in person appointments. The patient expressed understanding and agreed to proceed.     I discussed the assessment and treatment plan with the patient. The patient was provided an opportunity to ask questions and all were answered. The patient agreed with the plan and demonstrated an understanding of the instructions.   The patient was advised to call back or seek an in-person evaluation if the symptoms worsen or if the condition fails to improve as anticipated.  I provided 15 minutes of non-face-to-face time during this encounter.    History of Present Illness: Patient is a 46 years old currently married Caucasian female initially referred by primary care physician office to establish care for depression, bipolar  Still stress related to relationship, he doesn't add much for finances.  Her 2 daughters went thru labor, young one was 21 weeks so they are reviewing options with her Overlal med keep some balance, feels tired at times Therapy helps  Avoids crowds, related to triggers from  past  Denies recent use of drugs  Aggravating factors; [past abuse, mom's death difficult dealing with daughter who may have mood symptoms Modifying factors; husband but feels it not going well. 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    Consequences of Substance Abuse: Have used heroine, cocaine, alcohol. Discussed its effect to depression, impaired judjement  Past Medical History:  Past Medical History:  Diagnosis Date   ADHD (attention deficit hyperactivity disorder)    Depression    High cholesterol    Hypertension    Thyroid disease     Past Surgical History:  Procedure Laterality Date   TONSILLECTOMY     TUBAL LIGATION      Family Psychiatric History: mom: bipolar  Family History:  Family History  Family history unknown: Yes    Social History:   Social History   Socioeconomic History   Marital status: Divorced    Spouse name: Not on file   Number of children: Not on file   Years of education: Not on file   Highest education level: Not on file  Occupational History   Not on file  Tobacco Use   Smoking status: Every Day    Packs/day: 0.50    Types: Cigarettes   Smokeless tobacco: Never  Vaping Use   Vaping Use: Never used  Substance and Sexual Activity   Alcohol use: Not Currently   Drug use: No   Sexual activity: Yes    Birth control/protection: Surgical  Other Topics Concern   Not on file  Social History Narrative   Not on file   Social Determinants of Health   Financial Resource Strain: Not on file  Food Insecurity: Not on file  Transportation Needs: Not on file  Physical Activity: Not on file  Stress: Not on file  Social Connections: Not on file     Allergies:  No Known  Allergies  Metabolic Disorder Labs: Lab Results  Component Value Date   HGBA1C 5.7 (H) 06/07/2020   MPG 116.89 06/07/2020   No results found for: PROLACTIN Lab Results  Component Value Date   CHOL 278 (H) 06/07/2020   TRIG 214 (H) 06/07/2020   HDL 61 06/07/2020   CHOLHDL 4.6 06/07/2020   VLDL 43 (H) 06/07/2020   LDLCALC 174 (H) 06/07/2020   Lab Results  Component Value Date   TSH 2.028 04/04/2021    Therapeutic Level Labs: No results found for: LITHIUM No results found for: CBMZ No results found for: VALPROATE  Current Medications: Current Outpatient Medications  Medication Sig Dispense Refill   amoxicillin-clavulanate (AUGMENTIN) 875-125 MG tablet Take 1 tablet by mouth every 12 (twelve) hours. 14 tablet 0   dicyclomine (BENTYL) 20 MG tablet Take 1 tablet (20 mg total) by mouth 2 (two) times daily for 15 days. For stomach cramping and upset 30 tablet 0   FLUoxetine (PROZAC) 20 MG capsule Take 1 capsule (20 mg total) by mouth daily. 30 capsule 1   hydrochlorothiazide (HYDRODIURIL) 25 MG tablet Take 25 mg by mouth daily.     levothyroxine (SYNTHROID) 25 MCG tablet Take by mouth.     potassium chloride (KLOR-CON) 10 MEQ tablet Take 1 tablet (10 mEq total) by mouth daily. 30 tablet 0   pravastatin (PRAVACHOL) 20 MG tablet Take 1 tablet (20 mg total) by mouth daily at 6 PM. 30 tablet 0   QUEtiapine (SEROQUEL) 200 MG tablet Take 1 tablet (200 mg total) by mouth at bedtime. 30 tablet 1   Vitamin D, Ergocalciferol, (DRISDOL) 1.25 MG (50000 UNIT) CAPS capsule Take 1 capsule (50,000 Units total) by mouth every 7 (seven) days. 5 capsule 0   No current facility-administered medications for this visit.     Psychiatric Specialty Exam: Review of Systems  Cardiovascular:  Negative for chest pain.  Neurological:  Negative for tremors.  Psychiatric/Behavioral:  Negative for agitation, self-injury and suicidal ideas.    There were no vitals taken for this visit.There is no height or  weight on file to calculate BMI.  General Appearance: Casual  Eye Contact:  Fair  Speech:  Normal Rate  Volume:  Normal  Mood: somewhat subdued  Affect:  Congruent  Thought Process:  Goal Directed  Orientation:  Full (Time, Place, and Person)  Thought Content:  Rumination  Suicidal Thoughts:  No  Homicidal Thoughts:  No  Memory:  Immediate;   Fair  Judgement:  Fair  Insight:  Shallow  Psychomotor Activity:  Normal  Concentration:  Concentration: Fair  Recall:  Fair  Fund of Knowledge:Good  Language: Good  Akathisia:  No  Handed:    AIMS (if indicated):  no involuntary movements  Assets:  Desire for Improvement Housing Social Support  ADL's:  Intact  Cognition: WNL  Sleep:  Poor   Screenings: AIMS    Flowsheet Row Ad[facility] (Discharged) [facility]  AIMS Total Score 0      AUDIT    Flowsheet Row Admission (Discharged) [facility]  Alcohol Use Disorder Identification Test Final Score (AUDIT) 0      GAD-7    Flowsheet Row Office Visit [facility] 02[facility]25/2021 [facility] [place] Office Visit from 05/26/2016 in [facility] [place]  Total GAD-7 Score 21 18      PHQ2-9    Flowsheet Row Counselor from 05/05/2021 in [facility] [place] Office Visit from 02/25/2021 in [facility] [place] Counselor from 01/18/2021 in [facility] [place] Video Visit from 01/15/2021 in [facility] [place] Office Visit from 05/26/2016 in [facility] [place]  PHQ-2 Total Score 2 4 1 1 2   PHQ-9 Total Score 8 16 -- -- 13      Flowsheet Row Video Visit from 06/02/2021 in [facility] [place] Counselor from 05/05/2021 in  [facility] [place] ED from 04/04/2021 in [facility]  C-SSRS RISK CATEGORY Error: Q3, 4, or 5 should not be populated when Q2 is No Error: Q3, 4, or 5 should not be populated when Q2 is No No Risk       Assessment and Plan: as follows  Prior documentation reviewed   Mood disorder possible bipolar disorder current episode depressed; somewhat subdued due to stressors but managing it. Continue prozac, seroquel Have better sleep wake cycle    Depressive disorder recurrent with psychotic features; see above    Possible PTSD : avoids crowds, continue therapy and prozac  Mixed anxiety symptoms:work on coping skill and continue prozac and therapy  Fu 81m  1m, MD 2/1/20232:45 PM

## 2021-06-03 ENCOUNTER — Telehealth (HOSPITAL_COMMUNITY): Payer: Self-pay | Admitting: Licensed Clinical Social Worker

## 2021-06-03 NOTE — Telephone Encounter (Signed)
Her PO left a vm and wants to talk to [facility]. I am including Dr. De Nurse in this message   [email or web address]    She told her PO she is taking adderall but tested positive today for cocaine

## 2021-06-08 ENCOUNTER — Ambulatory Visit (HOSPITAL_COMMUNITY): Payer: 59 | Admitting: Licensed Clinical Social Worker

## 2021-06-15 ENCOUNTER — Ambulatory Visit (HOSPITAL_COMMUNITY): Payer: 59 | Admitting: Licensed Clinical Social Worker

## 2021-06-15 ENCOUNTER — Other Ambulatory Visit: Payer: Self-pay

## 2021-06-22 ENCOUNTER — Telehealth (HOSPITAL_COMMUNITY): Payer: Self-pay | Admitting: Licensed Clinical Social Worker

## 2021-06-22 ENCOUNTER — Ambulatory Visit (HOSPITAL_COMMUNITY): Payer: 59 | Admitting: Licensed Clinical Social Worker

## 2021-06-22 NOTE — Telephone Encounter (Signed)
Called pt to let her know the provider thinks it best to do in office visits due to there being missed appointments when they are virtual.   Left a vm stating that Ivin Booty would like to do in office visits going forward and if pt is unwilling we will have to discharge from the provider.   Pt also has several (2 No Shows) Missed/No Shows and due to policy after 3 No Shows - pt is dismissed from practice.

## 2021-06-29 ENCOUNTER — Ambulatory Visit (INDEPENDENT_AMBULATORY_CARE_PROVIDER_SITE_OTHER): Payer: No Payment, Other | Admitting: Licensed Clinical Social Worker

## 2021-06-29 DIAGNOSIS — F319 Bipolar disorder, unspecified: Secondary | ICD-10-CM | POA: Diagnosis not present

## 2021-06-30 NOTE — Progress Notes (Signed)
Virtual Visit via Telephone Note  I connected with [name] on 06/30/21 at  9:00 AM EST by telephone and verified that I am speaking with the correct person using two identifiers.  Location: Patient: Alice Martinez, parked Provider: Office   I discussed the limitations, risks, security and privacy concerns of performing an evaluation and management service by telephone and the availability of in person appointments. I also discussed with the patient that there may be a patient responsible charge related to this service. The patient expressed understanding and agreed to proceed.   THERAPIST PROGRESS NOTE  Session Time: 9:00 am-9:20 am  Type of Therapy: Individual Therapy  Purpose of session: Bryana will manage anxiety and mood as evidenced by coping with things beyond her control, coping with daily stressors, and regulating sleep for 5 out of 7 days for 60 days  Interventions: Therapist utilized CBT and Solution focused brief therapy to address anxiety and mood. Therapist provided support and empathy to patient during session. Therapist explored patient's triggers for mood and processed her feelings to mood. Therapist worked with patient to identify her immediate needs for today.   Effectiveness: Patient was oriented x5 (person, place, situation, time, and object). Patient was casually dressed, and appropriately groomed. Patient was alert, engaged, depressed, and cooperative. Patient was hurt and sad. She had just left the hospital. Her daughter is still in the hospital and they are waiting for her baby to be born then pass. She noted that the baby has deformities or is not fully developed. Patient also found out that her husband left her for another woman the previous night. He left the home while she was asleep and moved out. She randomly pulled into a gas station and found her husband with another woman. Patient was devastated. Patient was did not sleep and cried many times throughout the night.  She felt bad because she was getting sad while visiting her daughter. Patient was exhausted and felt like what she needed was time alone and to sleep. She was headed home to sleep.   Patient engaged in session. Patient responded well to interventions. Patient continues to meet criteria for Bipolar 1 and Generalized Anxiety Disorder. Patient will continue in outpatient therapy due to being the least restrictive service to meet her needs. Patient made minimal progress on her goals at this time.    Suicidal/Homicidal: Negativewithout intent/plan  Plan: Return again in 1-2 weeks.  Diagnosis: Axis I: Bipolar, Depressed and Generalized Anxiety Disorder    Axis II: No diagnosis   I discussed the assessment and treatment plan with the patient. The patient was provided an opportunity to ask questions and all were answered. The patient agreed with the plan and demonstrated an understanding of the instructions.   The patient was advised to call back or seek an in-person evaluation if the symptoms worsen or if the condition fails to improve as anticipated.  I provided 20 minutes of non-face-to-face time during this encounter.   Bynum Bellows, LCSW 06/30/2021

## 2021-07-19 ENCOUNTER — Ambulatory Visit: Payer: Self-pay | Admitting: Neurology

## 2021-09-21 ENCOUNTER — Ambulatory Visit (HOSPITAL_COMMUNITY): Payer: 59 | Admitting: Psychiatry

## 2021-11-12 ENCOUNTER — Encounter (HOSPITAL_COMMUNITY): Payer: Self-pay | Admitting: Student in an Organized Health Care Education/Training Program

## 2021-11-12 ENCOUNTER — Ambulatory Visit (INDEPENDENT_AMBULATORY_CARE_PROVIDER_SITE_OTHER): Payer: No Payment, Other | Admitting: Student in an Organized Health Care Education/Training Program

## 2021-11-12 VITALS — BP 126/70 | HR 92 | Wt 170.6 lb

## 2021-11-12 DIAGNOSIS — F4312 Post-traumatic stress disorder, chronic: Secondary | ICD-10-CM

## 2021-11-12 DIAGNOSIS — F4310 Post-traumatic stress disorder, unspecified: Secondary | ICD-10-CM

## 2021-11-12 DIAGNOSIS — F319 Bipolar disorder, unspecified: Secondary | ICD-10-CM | POA: Diagnosis not present

## 2021-11-12 DIAGNOSIS — F411 Generalized anxiety disorder: Secondary | ICD-10-CM

## 2021-11-12 DIAGNOSIS — F3170 Bipolar disorder, currently in remission, most recent episode unspecified: Secondary | ICD-10-CM | POA: Diagnosis not present

## 2021-11-12 DIAGNOSIS — F331 Major depressive disorder, recurrent, moderate: Secondary | ICD-10-CM

## 2021-11-12 DIAGNOSIS — G4700 Insomnia, unspecified: Secondary | ICD-10-CM

## 2021-11-12 DIAGNOSIS — F1910 Other psychoactive substance abuse, uncomplicated: Secondary | ICD-10-CM

## 2021-11-12 MED ORDER — QUETIAPINE FUMARATE 200 MG PO TABS: 200.0000 mg | ORAL_TABLET | Freq: Every day | ORAL | 1 refills | Status: DC

## 2021-11-12 MED ORDER — TRAZODONE HCL 50 MG PO TABS: 50.0000 mg | ORAL_TABLET | Freq: Every day | ORAL | 0 refills | Status: DC

## 2021-11-12 NOTE — Progress Notes (Signed)
Psychiatric Initial Adult Assessment   Patient Identification: Alice Martinez MRN:  [id number] Date of Evaluation:  11/12/2021 Referral Source: Mitzi Hansen NP Chief Complaint:  No chief complaint on file.  Visit Diagnosis:    ICD-10-CM   1. Bipolar affective disorder in remission (HCC)  F31.70 QUEtiapine (SEROQUEL) 200 MG tablet    2. Chronic post-traumatic stress disorder (PTSD)  F43.12 QUEtiapine (SEROQUEL) 200 MG tablet    3. GAD (generalized anxiety disorder)  F41.1 QUEtiapine (SEROQUEL) 200 MG tablet    4. Bipolar I disorder (HCC)  F31.9     5. PTSD (post-traumatic stress disorder)  F43.10     6. Major depressive disorder, recurrent episode, moderate (HCC)  F33.1     7. Insomnia, unspecified type  G47.00 traZODone (DESYREL) 50 MG tablet      History of Present Illness:   Alice Martinez is a 46 yr old female who presents to establish care and for medication management.  PPHx is significant for Bipolar Disorder, PTSD, History of Polysubstance Abuse (EtOH, Fentanyl, Heroin, Xanax, Cocaine), GAD, MDD, and OCD, prior suicide attempts (OD x2 Dec 2022), 2 hospitalizations (latest [facility] 06/2020), and Residential Rehab [facility] 08/2021).  She reports that she would like to establish care today because she is running out of her medications.  She reports that she had been in [place] for Rehab and while there she had been prescribed her medications but that now she has moved back to [place] she does not have a provider.  She reports that she has been stable on her current regiment.  She reports her mood stays stable, she has no depression, and she has no anxiety.  She reports that her sleep has been good.  She reports a past psychiatric history of Bipolar Disorder, PTSD, History of Polysubstance Abuse (EtOH, Fentanyl, Heroin, Xanax, Cocaine), GAD, MDD, and OCD.  She reports prior suicide attempts 2 times in December 2022 via overdose.  She does report 2 prior hospitalizations the latest  being February 2022.  She reports no history of self-injurious behavior.  She reports being in residential rehab in [place] earlier this year.  She reports a family history significant for substance use in her mother and father and an unknown diagnosis in her mother.  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  She reports past surgical history of tonsil removal and tubal ligation.  She reports no history of head trauma.  She does report a history of withdrawal seizures when she was withdrawing from alcohol and Xanax.  She reports NKDA.  She reports she is currently living with her sister.  She reports she works at the [employer] course.  She reports graduating high school.  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  She does report a significant substance use history.  She reports starting alcohol around age 24.  She reports past use of fentanyl, heroin, Xanax, THC, and cocaine.  She reports she has been clean for 5 months.  She reports she will be entering a plea on Monday for probation for 3 years.  She reports no access to firearms.  She reports no issues with food insecurity.  She reports having 3 daughters who have all moved out.  She reports no current symptoms of depression, anxiety, mania, or psychosis.  She does report sporadic dreams from PTSD but otherwise has no other symptoms.  She reports that she has gained weight since entering rehab and becoming clean.  Discussed that  Seroquel can cause weight gain which she states she is aware of.  Discussed we could trial Abilify as this is weight neutral and would still provide mood stabilization and treatment for her depression and anxiety.  She reports that she is very nervous at this time to make any changes because she is stable and doing so well.  Discussed that we can continue with Seroquel at this time.  She reports her trazodone is  helping her sleep  and so would like to continue.  She reports no SI, HI, or AVH.  She reports that her sleep is good.  She reports her appetite is good.  She reports no side effects from her medications.  Discussed with her what to do in the event of a future crisis.  Discussed that she can return to [facility], go to [facility], go to the nearest ED, or call 911 or [phone].   She reported understanding and had no concerns.   Associated Signs/Symptoms: Depression Symptoms:   Reports None since Rehab (Hypo) Manic Symptoms:   Reports None since Rehab Anxiety Symptoms:   Reports None since Rehab Psychotic Symptoms:   Reports None PTSD Symptoms: Sporadic dreams  Past Psychiatric History: Bipolar Disorder, PTSD, History of Polysubstance Abuse (EtOH, Fentanyl, Heroin, Xanax, Cocaine), GAD, MDD, and OCD, prior suicide attempts (OD x2 Dec 2022), 2 hospitalizations (latest [facility] 06/2020), and Residential Rehab ([place] 08/2021).  Previous Psychotropic Medications: Yes Seroquel, BuSpar, Trazodone, Prozac, Lamictal, Effexor.  Substance Abuse History in the last 12 months:  Yes.    Consequences of Substance Abuse: In Rehab [place] 2023  Past Medical History:  Past Medical History:  Diagnosis Date   ADHD (attention deficit hyperactivity disorder)    Depression    High cholesterol    Hypertension    Thyroid disease     Past Surgical History:  Procedure Laterality Date   TONSILLECTOMY     TUBAL LIGATION      Family Psychiatric History: Mother- unknown diagnosis and Substance Use Father- Depression and EtOH Abuse No Known Suicides  Family History:  Family History  Family history unknown: Yes    Social History:   Social History   Socioeconomic History   Marital status: Divorced    Spouse name: Not on file   Number of children: Not on file   Years of education: Not on file   Highest education level: Not on file  Occupational History   Not on file  Tobacco Use   Smoking status: Every Day    Packs/day: 0.50     Types: Cigarettes   Smokeless tobacco: Never  Vaping Use   Vaping Use: Never used  Substance and Sexual Activity   Alcohol use: Not Currently   Drug use: No   Sexual activity: Yes    Birth control/protection: Surgical  Other Topics Concern   Not on file  Social History Narrative   Not on file   Social Determinants of Health   Financial Resource Strain: Not on file  Food Insecurity: Not on file  Transportation Needs: Not on file  Physical Activity: Not on file  Stress: Not on file  Social Connections: Not on file    Additional Social History: Lives with sister Recently in [place] for Rehab, 5 months sober.  Allergies:  No Known Allergies  Metabolic Disorder Labs: Lab Results  Component Value Date   HGBA1C 5.7 (H) 06/07/2020   MPG 116.89 06/07/2020   No results found for: "PROLACTIN" Lab Results  Component Value Date  CHOL 278 (H) 06/07/2020   TRIG 214 (H) 06/07/2020   HDL 61 06/07/2020   CHOLHDL 4.6 06/07/2020   VLDL 43 (H) 06/07/2020   LDLCALC 174 (H) 06/07/2020   Lab Results  Component Value Date   TSH 2.028 04/04/2021    Therapeutic Level Labs: No results found for: "LITHIUM" No results found for: "CBMZ" No results found for: "VALPROATE"  Current Medications: Current Outpatient Medications  Medication Sig Dispense Refill   traZODone (DESYREL) 50 MG tablet Take 1 tablet (50 mg total) by mouth at bedtime. 30 tablet 0   levothyroxine (SYNTHROID) 25 MCG tablet Take by mouth.     pravastatin (PRAVACHOL) 20 MG tablet Take 1 tablet (20 mg total) by mouth daily at 6 PM. 30 tablet 0   QUEtiapine (SEROQUEL) 200 MG tablet Take 1 tablet (200 mg total) by mouth at bedtime. 30 tablet 1   No current facility-administered medications for this visit.    Musculoskeletal: Strength & Muscle Tone: within normal limits Gait & Station: normal Patient leans: N/A  Psychiatric Specialty Exam: Review of Systems  Respiratory:  Negative for shortness of breath.    Cardiovascular:  Negative for chest pain.  Gastrointestinal:  Negative for abdominal pain, constipation, diarrhea, nausea and vomiting.  Neurological:  Negative for dizziness, weakness and headaches.  Psychiatric/Behavioral:  Negative for dysphoric mood, hallucinations, self-injury, sleep disturbance and suicidal ideas. The patient is not nervous/anxious.     There were no vitals taken for this visit.There is no height or weight on file to calculate BMI.  General Appearance: Casual and Fairly Groomed  Eye Contact:  Good  Speech:  Clear and Coherent and Normal Rate  Volume:  Normal  Mood:  Euthymic  Affect:  Appropriate and Congruent  Thought Process:  Coherent and Goal Directed  Orientation:  Full (Time, Place, and Person)  Thought Content:  Logical  Suicidal Thoughts:  No  Homicidal Thoughts:  No  Memory:  Immediate;   Good Recent;   Good  Judgement:  Good  Insight:  Good  Psychomotor Activity:  Normal  Concentration:  Concentration: Good and Attention Span: Good  Recall:  Good  Fund of Knowledge:Good  Language: Good  Akathisia:  Negative  Handed:  Right  AIMS (if indicated):  done AIMS=0  Assets:  Communication Skills Desire for Improvement Resilience Social Support Vocational/Educational  ADL's:  Intact  Cognition: WNL  Sleep:  Good   Screenings: AIMS    Flowsheet Row Ad[facility] (Discharged) [facility]  AIMS Total Score 0      AUDIT    Flowsheet Row Admission (Discharged) from [facility]  Alcohol Use Disorder Identification Test Final Score (AUDIT) 0      GAD-7    Flowsheet Row Office Visit from 11/12/2021 in [facility] Office Visit from 02/25/2021 in [facility] [place] Office Visit from 05/26/2016 in [facility] [place]  Total GAD-7 Score 0  21 18      PHQ2-9    Flowsheet Row Office Visit from 11/12/2021 in [facility] Counselor from 05/05/2021 in [facility] [place] Office Visit from 02/25/2021 in [facility] AT [place] Counselor from 01/18/2021 in [facility] AT [place] Video Visit from 01/15/2021 in [facility] AT [place]  PHQ-2 Total Score 0 2 4 1 1   PHQ-9  Total Score -- 8 16 -- --      Flowsheet Row Video Visit from 06/02/2021 in [facility] AT [place] Counselor from 05/05/2021 in [facility] AT [place] ED from 04/04/2021 in [facility]  C-SSRS RISK CATEGORY Error: Q3, 4, or 5 should not be populated when Q2 is No Error: Q3, 4, or 5 should not be populated when Q2 is No No Risk       Assessment and Plan:  Novalyn Lajara is a 46 yr old female who presents to establish care and for medication management.  PPHx is significant for Bipolar Disorder, PTSD, History of Polysubstance Abuse (EtOH, Fentanyl, Heroin, Xanax, Cocaine), GAD, MDD, and OCD, prior suicide attempts (OD x2 Dec 2022), 2 hospitalizations (latest [facility] 06/2020), and Residential Rehab [facility] 08/2021).   Justyce has been stable on her Seroquel with no symptoms PHQ2= 0 and GAD7= 0.  Though she has gained weight since becoming clean she does think it is healthy weight and is happy with where she is.  Given her concerns of destabilizing with medication change we will not make changes at this time.  She will look into Abilify and we will discuss potential changes at a later time. She will return to the office in 4 weeks.   Bipolar Disorder  Chronic PTSD: -Continue Seroquel 200 mg QHS.  30 tablets with 0 refills sent.   History of Polysubstance Abuse (EtOH, Fentanyl, Heroin, Xanax, Cocaine): -Sober for 5 months -Encouraged continuing abstinence  from all substances    Insomnia: -Continue Trazodone 50 mg QHS.  30 tablets with 0 refills.     Collaboration of Care:   Patient/Guardian was advised Release of Information must be obtained prior to any record release in order to collaborate their care with an outside provider. Patient/Guardian was advised if they have not already done so to contact the registration department to sign all necessary forms in order for [place] to release information regarding their care.   Consent: Patient/Guardian gives verbal consent for treatment and assignment of benefits for services provided during this visit. Patient/Guardian expressed understanding and agreed to proceed.   Lauro Franklin, MD 7/14/20238:49 AM

## 2021-12-09 ENCOUNTER — Telehealth (HOSPITAL_COMMUNITY): Payer: Self-pay | Admitting: *Deleted

## 2021-12-09 DIAGNOSIS — G4700 Insomnia, unspecified: Secondary | ICD-10-CM

## 2021-12-09 MED ORDER — TRAZODONE HCL 50 MG PO TABS: 50.0000 mg | ORAL_TABLET | Freq: Every day | ORAL | 0 refills | Status: DC

## 2021-12-09 NOTE — Telephone Encounter (Signed)
Received message that patient needed refill of Trazodone.  This was sent.   Sent: -Trazodone 50 mg QHS.  30 tablets with 0 refills.    Arna Snipe MD Resident

## 2021-12-09 NOTE — Telephone Encounter (Signed)
Rx Refill Request traZODone (DESYREL) 50 MG tablet 

## 2021-12-09 NOTE — Addendum Note (Signed)
Addended by: Lauro Franklin on: 12/09/2021 12:20 PM   Modules accepted: Orders

## 2021-12-14 ENCOUNTER — Telehealth (HOSPITAL_COMMUNITY): Payer: Self-pay | Admitting: *Deleted

## 2021-12-14 DIAGNOSIS — G4700 Insomnia, unspecified: Secondary | ICD-10-CM

## 2021-12-14 NOTE — Telephone Encounter (Signed)
Rx Refill Request traZODone (DESYREL) 50 MG tablet 

## 2021-12-20 ENCOUNTER — Telehealth (HOSPITAL_COMMUNITY): Payer: Self-pay

## 2021-12-20 DIAGNOSIS — G4700 Insomnia, unspecified: Secondary | ICD-10-CM

## 2021-12-20 MED ORDER — TRAZODONE HCL 50 MG PO TABS: 50.0000 mg | ORAL_TABLET | Freq: Every day | ORAL | 0 refills | Status: DC

## 2021-12-20 NOTE — Addendum Note (Signed)
Addended by: Lauro Franklin on: 12/20/2021 07:56 AM   Modules accepted: Orders

## 2021-12-20 NOTE — Telephone Encounter (Signed)
Requesting refill

## 2021-12-20 NOTE — Telephone Encounter (Signed)
Patient called requesting refill of her trazodone.  This was sent.    Sent: Trazodone 50 mg QHS.  30 tablets with 0 refills.    Arna Snipe MD Resident

## 2021-12-21 ENCOUNTER — Encounter (HOSPITAL_COMMUNITY): Payer: No Payment, Other | Admitting: Student in an Organized Health Care Education/Training Program

## 2022-01-01 ENCOUNTER — Inpatient Hospital Stay (HOSPITAL_COMMUNITY): Payer: Commercial Managed Care - HMO

## 2022-01-01 ENCOUNTER — Inpatient Hospital Stay (HOSPITAL_BASED_OUTPATIENT_CLINIC_OR_DEPARTMENT_OTHER)
Admission: EM | Admit: 2022-01-01 | Discharge: 2022-01-04 | DRG: 157 | Disposition: A | Discharge status: Home / Self Care | Payer: Commercial Managed Care - HMO | Attending: Internal Medicine | Admitting: Internal Medicine

## 2022-01-01 ENCOUNTER — Encounter (HOSPITAL_COMMUNITY): Payer: Self-pay

## 2022-01-01 ENCOUNTER — Emergency Department (HOSPITAL_BASED_OUTPATIENT_CLINIC_OR_DEPARTMENT_OTHER): Payer: Commercial Managed Care - HMO

## 2022-01-01 DIAGNOSIS — Z79890 Hormone replacement therapy (postmenopausal): Secondary | ICD-10-CM

## 2022-01-01 DIAGNOSIS — R809 Proteinuria, unspecified: Secondary | ICD-10-CM | POA: Diagnosis present

## 2022-01-01 DIAGNOSIS — F411 Generalized anxiety disorder: Secondary | ICD-10-CM | POA: Diagnosis present

## 2022-01-01 DIAGNOSIS — E039 Hypothyroidism, unspecified: Secondary | ICD-10-CM | POA: Diagnosis present

## 2022-01-01 DIAGNOSIS — F909 Attention-deficit hyperactivity disorder, unspecified type: Secondary | ICD-10-CM | POA: Diagnosis present

## 2022-01-01 DIAGNOSIS — R200 Anesthesia of skin: Secondary | ICD-10-CM | POA: Diagnosis present

## 2022-01-01 DIAGNOSIS — J810 Acute pulmonary edema: Secondary | ICD-10-CM | POA: Diagnosis present

## 2022-01-01 DIAGNOSIS — R0601 Orthopnea: Secondary | ICD-10-CM | POA: Diagnosis present

## 2022-01-01 DIAGNOSIS — Z79899 Other long term (current) drug therapy: Secondary | ICD-10-CM | POA: Diagnosis not present

## 2022-01-01 DIAGNOSIS — E7800 Pure hypercholesterolemia, unspecified: Secondary | ICD-10-CM | POA: Diagnosis present

## 2022-01-01 DIAGNOSIS — I10 Essential (primary) hypertension: Secondary | ICD-10-CM | POA: Diagnosis present

## 2022-01-01 DIAGNOSIS — M79662 Pain in left lower leg: Secondary | ICD-10-CM | POA: Diagnosis present

## 2022-01-01 DIAGNOSIS — M79661 Pain in right lower leg: Secondary | ICD-10-CM | POA: Diagnosis present

## 2022-01-01 DIAGNOSIS — L0291 Cutaneous abscess, unspecified: Secondary | ICD-10-CM | POA: Diagnosis present

## 2022-01-01 DIAGNOSIS — F419 Anxiety disorder, unspecified: Secondary | ICD-10-CM | POA: Diagnosis present

## 2022-01-01 DIAGNOSIS — R202 Paresthesia of skin: Secondary | ICD-10-CM | POA: Diagnosis present

## 2022-01-01 DIAGNOSIS — K122 Cellulitis and abscess of mouth: Secondary | ICD-10-CM | POA: Diagnosis present

## 2022-01-01 DIAGNOSIS — L0390 Cellulitis, unspecified: Secondary | ICD-10-CM | POA: Diagnosis present

## 2022-01-01 DIAGNOSIS — K029 Dental caries, unspecified: Secondary | ICD-10-CM | POA: Diagnosis present

## 2022-01-01 DIAGNOSIS — R7989 Other specified abnormal findings of blood chemistry: Secondary | ICD-10-CM | POA: Diagnosis present

## 2022-01-01 DIAGNOSIS — E876 Hypokalemia: Secondary | ICD-10-CM | POA: Diagnosis present

## 2022-01-01 DIAGNOSIS — R599 Enlarged lymph nodes, unspecified: Secondary | ICD-10-CM | POA: Diagnosis present

## 2022-01-01 DIAGNOSIS — F1910 Other psychoactive substance abuse, uncomplicated: Secondary | ICD-10-CM | POA: Diagnosis present

## 2022-01-01 DIAGNOSIS — L03211 Cellulitis of face: Secondary | ICD-10-CM

## 2022-01-01 DIAGNOSIS — M7989 Other specified soft tissue disorders: Secondary | ICD-10-CM | POA: Diagnosis present

## 2022-01-01 DIAGNOSIS — F32A Depression, unspecified: Secondary | ICD-10-CM | POA: Diagnosis present

## 2022-01-01 DIAGNOSIS — F17210 Nicotine dependence, cigarettes, uncomplicated: Secondary | ICD-10-CM | POA: Diagnosis present

## 2022-01-01 LAB — CBC WITH DIFFERENTIAL/PLATELET
Abs Immature Granulocytes: 0.02 10*3/uL (ref 0.00–0.07)
Basophils Absolute: 0 10*3/uL (ref 0.0–0.1)
Basophils Relative: 1 %
Eosinophils Absolute: 0.3 10*3/uL (ref 0.0–0.5)
Eosinophils Relative: 4 %
HCT: 38.6 % (ref 36.0–46.0)
Hemoglobin: 13.1 g/dL (ref 12.0–15.0)
Immature Granulocytes: 0 %
Lymphocytes Relative: 21 %
Lymphs Abs: 1.6 10*3/uL (ref 0.7–4.0)
MCH: 29.9 pg (ref 26.0–34.0)
MCHC: 33.9 g/dL (ref 30.0–36.0)
MCV: 88.1 fL (ref 80.0–100.0)
Monocytes Absolute: 0.6 10*3/uL (ref 0.1–1.0)
Monocytes Relative: 8 %
Neutro Abs: 4.9 10*3/uL (ref 1.7–7.7)
Neutrophils Relative %: 66 %
Platelets: 267 10*3/uL (ref 150–400)
RBC: 4.38 MIL/uL (ref 3.87–5.11)
RDW: 13.1 % (ref 11.5–15.5)
WBC: 7.4 10*3/uL (ref 4.0–10.5)
nRBC: 0 % (ref 0.0–0.2)

## 2022-01-01 LAB — COMPREHENSIVE METABOLIC PANEL
ALT: 15 U/L (ref 0–44)
AST: 22 U/L (ref 15–41)
Albumin: 3.8 g/dL (ref 3.5–5.0)
Alkaline Phosphatase: 52 U/L (ref 38–126)
Anion gap: 7 (ref 5–15)
BUN: 6 mg/dL (ref 6–20)
CO2: 28 mmol/L (ref 22–32)
Calcium: 8.5 mg/dL — ABNORMAL LOW (ref 8.9–10.3)
Chloride: 104 mmol/L (ref 98–111)
Creatinine, Ser: 0.75 mg/dL (ref 0.44–1.00)
GFR, Estimated: >60 mL/min (ref >60)
Glucose, Bld: 93 mg/dL (ref 70–99)
Potassium: 3.5 mmol/L (ref 3.5–5.1)
Sodium: 139 mmol/L (ref 135–145)
Total Bilirubin: 0.6 mg/dL (ref 0.3–1.2)
Total Protein: 5.6 g/dL — ABNORMAL LOW (ref 6.5–8.1)

## 2022-01-01 LAB — HIV ANTIBODY (ROUTINE TESTING W REFLEX): HIV Screen 4th Generation wRfx: NONREACTIVE

## 2022-01-01 LAB — MAGNESIUM: Magnesium: 1.9 mg/dL (ref 1.7–2.4)

## 2022-01-01 LAB — HCG, QUANTITATIVE, PREGNANCY: hCG, Beta Chain, Quant, S: <1 m[IU]/mL (ref <5)

## 2022-01-01 LAB — T4, FREE: Free T4: 0.63 ng/dL (ref 0.61–1.12)

## 2022-01-01 MED ORDER — LORAZEPAM 2 MG/ML IJ SOLN
1.0000 mg | Freq: Once | INTRAMUSCULAR | Status: AC
  Administered 2022-01-01: 1 mg via INTRAVENOUS
  Filled 2022-01-01: qty 1

## 2022-01-01 MED ORDER — METHADONE HCL 10 MG PO TABS
5.0000 mg | ORAL_TABLET | Freq: Once | ORAL | Status: AC
  Administered 2022-01-01: 5 mg via ORAL
  Filled 2022-01-01 (×2): qty 1

## 2022-01-01 MED ORDER — CALCIUM GLUCONATE-NACL 1-0.675 GM/50ML-% IV SOLN
1.0000 g | Freq: Once | INTRAVENOUS | Status: AC
Start: 2022-01-01 — End: 2022-01-01
  Administered 2022-01-01: 1000 mg via INTRAVENOUS
  Filled 2022-01-01: qty 50

## 2022-01-01 MED ORDER — SODIUM CHLORIDE 0.9 % IV BOLUS
1000.0000 mL | Freq: Once | INTRAVENOUS | Status: AC
  Administered 2022-01-01: 1000 mL via INTRAVENOUS

## 2022-01-01 MED ORDER — QUETIAPINE FUMARATE 100 MG PO TABS
200.0000 mg | ORAL_TABLET | Freq: Every day | ORAL | Status: DC
  Administered 2022-01-01 – 2022-01-03 (×3): 200 mg via ORAL
  Filled 2022-01-01 (×3): qty 2

## 2022-01-01 MED ORDER — ORAL CARE MOUTH RINSE: 15.0000 mL | OROMUCOSAL | Status: DC | PRN

## 2022-01-01 MED ORDER — METHADONE HCL 10 MG PO TABS
30.0000 mg | ORAL_TABLET | Freq: Once | ORAL | Status: AC
  Administered 2022-01-01: 30 mg via ORAL
  Filled 2022-01-01 (×2): qty 3

## 2022-01-01 MED ORDER — METHADONE HCL 10 MG PO TABS
30.0000 mg | ORAL_TABLET | Freq: Every day | ORAL | Status: DC
  Administered 2022-01-04: 30 mg via ORAL
  Filled 2022-01-01: qty 3

## 2022-01-01 MED ORDER — KETOROLAC TROMETHAMINE 30 MG/ML IJ SOLN
30.0000 mg | Freq: Once | INTRAMUSCULAR | Status: AC
  Administered 2022-01-01: 30 mg via INTRAVENOUS
  Filled 2022-01-01: qty 1

## 2022-01-01 MED ORDER — IOHEXOL 300 MG/ML  SOLN
100.0000 mL | Freq: Once | INTRAMUSCULAR | Status: AC | PRN
  Administered 2022-01-01: 75 mL via INTRAVENOUS

## 2022-01-01 MED ORDER — METHADONE HCL 5 MG PO TABS
35.0000 mg | ORAL_TABLET | Freq: Once | ORAL | Status: DC
  Filled 2022-01-01 (×2): qty 1

## 2022-01-01 MED ORDER — SODIUM CHLORIDE 0.9 % IV SOLN
3.0000 g | Freq: Four times a day (QID) | INTRAVENOUS | Status: DC
  Administered 2022-01-01 – 2022-01-04 (×11): 3 g via INTRAVENOUS
  Filled 2022-01-01 (×11): qty 8

## 2022-01-01 MED ORDER — SODIUM CHLORIDE 0.9 % IV SOLN
3.0000 g | Freq: Once | INTRAVENOUS | Status: AC
  Administered 2022-01-01: 3 g via INTRAVENOUS

## 2022-01-01 MED ORDER — ENOXAPARIN SODIUM 40 MG/0.4ML IJ SOSY
40.0000 mg | PREFILLED_SYRINGE | INTRAMUSCULAR | Status: DC
  Administered 2022-01-01: 40 mg via SUBCUTANEOUS
  Filled 2022-01-01 (×2): qty 0.4

## 2022-01-01 NOTE — Assessment & Plan Note (Signed)
odontogenic cellulitis with deep tissue infection along the right mandible and submandibular space.  No issues with dysphagia or respiratory distress at this time.  Continue to monitor closely. -Continue IV Unasyn -EDP discussed with  Dr. Jenne Pane and no intervention recommended since she does not have airway emergency or drainable abscess. Dr. Ross Marcus with oral surgery contacted but was actually not on call and on vacation [place] of [place].Discussed with adult dentistry Dr. Rocky Morel who agrees with antibiotics observation for 24 to 48 hours. Dr. Chales Salmon is on call [place] and is the hospitalist dentist.

## 2022-01-01 NOTE — Assessment & Plan Note (Signed)
Replete with IV calcium.  Check IV magnesium

## 2022-01-01 NOTE — ED Triage Notes (Signed)
POV, c/o right sided neck swelling and pain. Started yesterday and has gotten worse. Alert and oriented x 4, ambulatory to triage.

## 2022-01-01 NOTE — Assessment & Plan Note (Addendum)
-   Continue home meds °

## 2022-01-01 NOTE — ED Provider Notes (Signed)
[facility] EMERGENCY DEPT Provider Note  CSN: [id number] Arrival date & time: 01/01/22 3151  Chief Complaint(s) Facial Swelling  HPI Alice Martinez is a 46 y.o. female with a past medical history listed below including polysubstance abuse who has reportedly been clean for 6 months here for right-sided facial swelling for the past 2 days.  She reports that she has runny nose and a sore throat.  Reports that she typically gets swollen lymph nodes with infections specifically the right submandibular.  However the swelling is more than usual.  She denies any fevers or chills.  No throat swelling, trouble breathing, trouble swallowing, shortness of breath.  No coughing or chest congestion.  No abdominal pain.  No nausea or vomiting.  The history is provided by the patient.    Past Medical History Past Medical History:  Diagnosis Date   ADHD (attention deficit hyperactivity disorder)    Depression    High cholesterol    Hypertension    Thyroid disease    Patient Active Problem List   Diagnosis Date Noted   Polysubstance abuse (HCC) 06/07/2020   Major depressive disorder, recurrent severe without psychotic features (HCC) 06/06/2020   Syncope 04/16/2020   Rhabdomyolysis 04/16/2020   Cocaine abuse (HCC) 04/16/2020   Weakness of right lower extremity 04/16/2020   PTSD (post-traumatic stress disorder) 05/27/2016   Dysthymia 05/27/2016   GAD (generalized anxiety disorder) 01/15/2016   Major depressive disorder, recurrent episode, moderate (HCC) 01/15/2016   Essential hypertension 01/15/2016   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 Authorizing Provider  levothyroxine (SYNTHROID) 25 MCG tablet Take by mouth. 09/11/20   [provider]  pravastatin (PRAVACHOL) 20 MG tablet Take 1 tablet (20 mg total) by mouth daily at 6 PM. 06/09/20    Laveda Abbe, NP  QUEtiapine (SEROQUEL) 200 MG tablet Take 1 tablet (200 mg total) by mouth at bedtime. 11/12/21   Lauro Franklin, MD  traZODone (DESYREL) 50 MG tablet Take 1 tablet (50 mg total) by mouth at bedtime. 12/20/21 01/19/22  Lauro Franklin, MD                                                                                                                                    Allergies Patient has no known allergies.  Review of Systems Review of Systems As noted in HPI  Physical Exam Vital Signs  I have reviewed the triage vital signs BP 126/75   Pulse 97   Temp 98.1 F (36.7 C) (Oral)   Resp 18   Ht 5\' 1"  (1.549 m)   Wt 77.1 kg   LMP 12/20/2021   SpO2 98%   BMI 32.12 kg/m   Physical Exam Vitals reviewed.  Constitutional:      General: She is not in acute distress.  Appearance: She is well-developed. She is not diaphoretic.  HENT:     Head: Normocephalic and atraumatic.     Right Ear: External ear normal.     Left Ear: External ear normal.     Nose: Nose normal.     Mouth/Throat:     Mouth: No angioedema.     Dentition: Abnormal dentition. Dental tenderness (mild) present. No dental abscesses.     Pharynx: No pharyngeal swelling, posterior oropharyngeal erythema or uvula swelling.      Comments: Right sublingual area with mild edema. Submandibular swelling crossing midline. Eyes:     General: No scleral icterus.    Conjunctiva/sclera: Conjunctivae normal.  Neck:     Trachea: Phonation normal.  Cardiovascular:     Rate and Rhythm: Normal rate and regular rhythm.  Pulmonary:     Effort: Pulmonary effort is normal. No respiratory distress.     Breath sounds: No stridor.  Abdominal:     General: There is no distension.  Musculoskeletal:        General: Normal range of motion.     Cervical back: Normal range of motion.  Neurological:     Mental Status: She is alert and oriented to person, place, and time.  Psychiatric:         Behavior: Behavior normal.     ED Results and Treatments Labs (all labs ordered are listed, but only abnormal results are displayed) Labs Reviewed  COMPREHENSIVE METABOLIC PANEL - Abnormal; Notable for the following components:      Result Value   Calcium 8.5 (*)    Total Protein 5.6 (*)    All other components within normal limits  CBC WITH DIFFERENTIAL/PLATELET  HCG, QUANTITATIVE, PREGNANCY                                                                                                                         EKG  EKG Interpretation  Date/Time:    Ventricular Rate:    PR Interval:    QRS Duration:   QT Interval:    QTC Calculation:   R Axis:     Text Interpretation:         Radiology CT Soft Tissue Neck W Contrast  Result Date: 01/01/2022 CLINICAL DATA:  46 year old female with right side neck pain and swelling. EXAM: CT NECK WITH CONTRAST TECHNIQUE: Multidetector CT imaging of the neck was performed using the standard protocol following the bolus administration of intravenous contrast. RADIATION DOSE REDUCTION: This exam was performed according to the departmental dose-optimization program which includes automated exposure control, adjustment of the mA and/or kV according to patient size and/or use of iterative reconstruction technique. CONTRAST:  68mL OMNIPAQUE IOHEXOL 300 MG/ML  SOLN COMPARISON:  None Available. FINDINGS: Pharynx and larynx: Within normal limits. Negative parapharyngeal and retropharyngeal space. Salivary glands: Most of the sublingual space remains normal. However, both submandibular glands appear to be hyperenhancing and inflamed. There is mild ductal dilatation in the right gland (series 3, image  55). But no dilatation of the more anterior submandibular ducts. No sialolithiasis identified. Furthermore there is a 2 cm heterogeneously enhancing phlegmon tracking inferior to the body of the right mandible (series 5 image 77) with thickening of the platysma. And  there is abnormal right anterior sublingual space soft tissue edema or subperiosteal fluid on image 86. See right dental findings below. Remainder of the right masticator space is normal. Left masticator in both parotid spaces remain normal. Thyroid: Negative. Lymph nodes: Reactive appearing level 1 and right level 2 lymph nodes. No cystic or necrotic nodes. Lymph node size up to 7 mm short axis. Thickening of the platysma in the submental region greater on the right. Generalized subcutaneous soft tissue swelling, stranding. Vascular: Major vascular structures in the neck and at the skull base remain patent. Limited intracranial: Negative. Visualized orbits: Negative. Mastoids and visualized paranasal sinuses: Clear throughout. Skeleton: Right mandible posterior molar periapical lucency and medial cortical dehiscence of the mandible alveolar process (series 5, image 75 and series 4, image 42). Regional soft tissue inflammation and heterogeneity. No other acute dental finding. No other No acute osseous abnormality identified. Upper chest: Negative. IMPRESSION: Odontogenic Cellulitis. Medial mandible dehiscence at the roots of the posterior right mandible molar with inflamed right sublingual and right > left submandibular spaces. Subperiosteal edema tracking along the medial mandible (series 5, image 86), and a 2 cm phlegmon tracking inferiorly from the body of the right mandible (series 5, image 77). But no well organized abscess at this time. Secondary inflammation of the submandibular glands. Reactive regional lymph nodes. Electronically Signed   By: Odessa Fleming M.D.   On: 01/01/2022 06:15    Medications Ordered in ED Medications  methadone (DOLOPHINE) tablet 35 mg (has no administration in time range)  sodium chloride 0.9 % bolus 1,000 mL (0 mLs Intravenous Stopped 01/01/22 0622)  iohexol (OMNIPAQUE) 300 MG/ML solution 100 mL (75 mLs Intravenous Contrast Given 01/01/22 0547)  Ampicillin-Sulbactam (UNASYN) 3 g in  sodium chloride 0.9 % 100 mL IVPB (3 g Intravenous New Bag/Given 01/01/22 0709)                                                                                                                                     Procedures .Critical Care  Performed by: Nira Conn, MD Authorized by: Nira Conn, MD   Critical care provider statement:    Critical care time (minutes):  45   Critical care time was exclusive of:  Separately billable procedures and treating other patients   Critical care was necessary to treat or prevent imminent or life-threatening deterioration of the following conditions: Ludwig's.   Critical care was time spent personally by me on the following activities:  Development of treatment plan with patient or surrogate, discussions with consultants, evaluation of patient's response to treatment, examination of patient, obtaining history from patient or surrogate, review of old charts, re-evaluation of patient's  condition, pulse oximetry, ordering and review of radiographic studies, ordering and review of laboratory studies and ordering and performing treatments and interventions Busy during the day my walking into my hands will be seen in early trickle trickle of the continuous flow throughout the night  (including critical care time)  Medical Decision Making / ED Course   Medical Decision Making Amount and/or Complexity of Data Reviewed Labs: ordered. Decision-making details documented in ED Course. Radiology: ordered and independent interpretation performed. Decision-making details documented in ED Course.  Risk Prescription drug management. Decision regarding hospitalization.    Right-sided submandibular swelling and tenderness. Possible lymphadenopathy but will need to assess for deep tissue infection especially with the dental tenderness. Labs and imaging ordered.  CBC without leukocytosis or anemia Metabolic panel without significant electrolyte  derangements or renal sufficiency CT soft tissue neck with deep tissue infection. No obvious abscess. Radiology confirmed and noted odontogenic cellulitis with periosteal edema and 2 cm phlegmon without obvious abscess.  Patient started on Unasyn. Consulted on-call oral surgeon Dr. Ross Marcus, but he is not supposed to be on call since he is out of the country. Will attempt to find correct on-cal oral surgeon or call ENT if needed.  Patient will need to be admitted for IV abx and close monitoring for possible surgical need.  Patient care turned over to oncoming provider. Patient case and results discussed in detail; please see their note for further ED managment.      Final Clinical Impression(s) / ED Diagnoses Final diagnoses:  Dentoalveolar cellulitis  Phlegmonous cellulitis           This chart was dictated using voice recognition software.  Despite best efforts to proofread,  errors can occur which can change the documentation meaning.    Nira Conn, MD 01/01/22 [phone]

## 2022-01-01 NOTE — Assessment & Plan Note (Signed)
-   Continue home meds °

## 2022-01-01 NOTE — H&P (Signed)
History and Physical    Patient: Alice Martinez QIH:[id number] DOB: 11-22-1975 DOA: 01/01/2022 DOS: the patient was seen and examined on 01/01/2022 PCP: Mitzi Hansen, NP  Patient coming from: Home  Chief Complaint:  Chief Complaint  Patient presents with   Facial Swelling   HPI: Alice Martinez is a 46 y.o. female with medical history significant of 46 yo F with hx of HTN, Depression, GAD, polysubstance abuse on methadone who presents with right sided facial swelling for the past 2 days.   Reports issues with cavity since last year but did not get it repaired. Last 2 days and not felt well with decrease appetite. Also complains of increase swelling to hands and feet for 2 weeks worse when laying down but improves on standing. Has numbness worse on right hand going into her first and second digit, slightly worse with wrist flexion. More numb on right foot. Has bilateral calf pain. Denies chest pain, shortness of breath. No neck pain or trauma. Uses tobacco but has been clean from alcohol or illicit drug use for over a year.    CT soft tissue neck with odontogenic cellulitis with deep tissue infection but no abscess.  Afebrile, CBC with no leukocytosis.  CMP unremarkable other than hypokalemia of 2.5.   EDP initially discussed with ENT Dr. Jenne Pane and no intervention recommended since she does not have airway emergency or drainable abscess. Dr. Ross Marcus with oral surgery contacted but was actually not on call and on vacation out of country.  Discussed with adult dentistry Dr. Rocky Morel who agrees with antibiotics observation for 24 to 48 hours. Patient started on IV Unasyn.  Transfer then made here to [facility] for continual management.   Review of Systems: As mentioned in the history of present illness. All other systems reviewed and are negative. Past Medical History:  Diagnosis Date   ADHD (attention deficit hyperactivity disorder)    Depression    High cholesterol    Hypertension     Thyroid disease    Past Surgical History:  Procedure Laterality Date   TONSILLECTOMY     TUBAL LIGATION     Social History:  reports that she has been smoking cigarettes. She has been smoking an average of .5 packs per day. She has never used smokeless tobacco. She reports that she does not currently use alcohol. She reports that she does not use drugs.  No Known Allergies  Family History  Family history unknown: Yes    Prior to Admission medications   Medication Sig Start Date End Date Taking? Authorizing Provider  levothyroxine (SYNTHROID) 25 MCG tablet Take by mouth. 09/11/20   [provider]  pravastatin (PRAVACHOL) 20 MG tablet Take 1 tablet (20 mg total) by mouth daily at 6 PM. 06/09/20   Laveda Abbe, NP  QUEtiapine (SEROQUEL) 200 MG tablet Take 1 tablet (200 mg total) by mouth at bedtime. 11/12/21   Lauro Franklin, MD  traZODone (DESYREL) 50 MG tablet Take 1 tablet (50 mg total) by mouth at bedtime. 12/20/21 01/19/22  Lauro Franklin, MD    Physical Exam: Vitals:   01/01/22 0700 01/01/22 0730 01/01/22 0930 01/01/22 1034  BP: 129/68 128/72 131/75 137/69  Pulse: (!) 101 (!) 103 98 93  Resp:  16 16 16   Temp:    98.7 F (37.1 C)  TempSrc:    Oral  SpO2: 100% 99%  98%  Weight:      Height:       Constitutional: NAD,  calm, comfortable,middle age female laying flat in bed Eyes: PERRL, lids and conjunctivae normal ENMT: Mucous membranes are moist.  Neck: right maxillary edema tracking down to right submandibular region.  Respiratory: clear to auscultation bilaterally, no wheezing, no crackles. Normal respiratory effort. No accessory muscle use. Trachea midline.  Cardiovascular: Regular rate and rhythm, no murmurs / rubs / gallops. Non-pitting edema of bilateral hands and feet.  Abdomen: no tenderness, no masses palpated.  Bowel sounds positive.  Musculoskeletal: no clubbing / cyanosis. No joint deformity upper and lower extremities. Good ROM, no  contractures. Normal muscle tone.  Skin: no rashes, lesions, ulcers. No induration Neurologic: CN 2-12 grossly intact. Strength 5/5 in all 4. Reports decrease sensation to right posterior 2nd and 3rd digit.  Psychiatric: Normal judgment and insight. Alert and oriented x 3. Normal mood. Data Reviewed:   See HPI  Assessment and Plan: * Cellulitis odontogenic cellulitis with deep tissue infection along the right mandible and submandibular space.  No issues with dysphagia or respiratory distress at this time.  Continue to monitor closely. -Continue IV Unasyn -EDP discussed with  Dr. Jenne Pane and no intervention recommended since she does not have airway emergency or drainable abscess. Dr. Ross Marcus with oral surgery contacted but was actually not on call and on vacation out of country.  Discussed with adult dentistry Dr. Rocky Morel who agrees with antibiotics observation for 24 to 48 hours. Dr. Chales Salmon is on call [place] and is the hospitalist dentist.     Hypothyroidism Continue home levothyroxine  Hypocalcemia Replete with IV calcium.  Check IV magnesium  Paresthesia Bilateral upper and lower extremity edema worse with laying and improves at rest. -Right hand paresthesia to the second to third digit with worsening on flexion of the wrists most concerning for carpal tunnel syndrome.  However symptoms are both upper and lower extremity.  Check cervical spine x-ray. -Also check TSH with history of hypothyroidism -Also has hypocalcemia.  Will replete and check magnesium.  Depression Continue home meds  Polysubstance abuse (HCC) History of polysubstance abuse currently in remission.  Continue methadone prescribed by [facility].  GAD (generalized anxiety disorder) Continue home meds      Advance Care Planning:   Code Status: Full Code   Consults: Dentistry   Family Communication: none at bedside   Severity of Illness: The appropriate patient status for this patient is INPATIENT.  Inpatient status is judged to be reasonable and necessary in order to provide the required intensity of service to ensure the patient's safety. The patient's presenting symptoms, physical exam findings, and initial radiographic and laboratory data in the context of their chronic comorbidities is felt to place them at high risk for further clinical deterioration. Furthermore, it is not anticipated that the patient will be medically stable for discharge from the hospital within 2 midnights of admission.   * I certify that at the point of admission it is my clinical judgment that the patient will require inpatient hospital care spanning beyond 2 midnights from the point of admission due to high intensity of service, high risk for further deterioration and high frequency of surveillance required.*  Author: Anselm Jungling, DO 01/01/2022 12:07 PM  For on call review [email or web address].

## 2022-01-01 NOTE — Assessment & Plan Note (Signed)
Continue home levothyroxine 

## 2022-01-01 NOTE — ED Provider Notes (Signed)
I assumed care of the patient from Dr Eudelia Bunch at 7:30 AM.  Briefly this is a 46 year old female presenting with facial pain and lower jaw swelling about 2 days ago.  Hx of right lower molar root canal about 1-2 years ago per her report, but no more recent surgeries.  She presents with brawny edema of the lower face on exam.  Airway is patent; no stridor; no uvula swelling; no respiratory distress.  WBC wnl.  Afebrile.  Does not appear septic.  CT imaging of the face demonstrates tissue swelling and 2 cm phelgmon along the mandible, impression:  " Medial mandible dehiscence at the roots of the posterior right  mandible molar with inflamed right sublingual and right > left  submandibular spaces.  Subperiosteal edema tracking along the medial mandible (series 5,  image 86), and a 2 cm phlegmon tracking inferiorly from the body of  the right mandible (series 5, image 77). But no well organized  abscess at this time.  "  Physical Exam  BP 126/75   Pulse 97   Temp 98.1 F (36.7 C) (Oral)   Resp 18   Ht 5\' 1"  (1.549 m)   Wt 77.1 kg   LMP 12/20/2021   SpO2 98%   BMI 32.12 kg/m   Physical Exam  Procedures  Procedures  ED Course / MDM    Medical Decision Making Amount and/or Complexity of Data Reviewed Labs: ordered. Radiology: ordered.  Risk Prescription drug management. Decision regarding hospitalization.   Dr 12/22/2021 discussed case with Dr Eudelia Bunch for ENT who reports their service can be consulted if there is concern for deep space abscess development or airway emergency; otherwise plan for a trial of antibiotics which may improve symptoms and infection.  Dr Jenne Pane also spoke to the listed on-call Oral surgeon Dr Eudelia Bunch, who reports he is out of the country, and should not be taking call currently.  I contacted Dr [facility] who reports there are no other on-call providers.  The patient may require further dental care for her right posterior molar after hospital  treatment; this could potentially be arranged as outpatient if she is stable on oral antibiotics at the time of discharge.  Patient reports she is on methadone 35 mg daily, which has been ordered and will need to be sent from [facility].  Pharmacy was not able to reach [facility] to verify dosing due to very limited clinic hours; they will dispense a single dose methadone today and try to reach the clinic tomorrow again, as the patient will be admitted to the hospital overnight.  I spoke to Dr [facility] the oncall dentist who agrees with a plan of 24-48 hours observation on IV antibiotics.  The preference would be outpatient management by an oral surgeon if the patient is stable; otherwise inpatient team can contact Dr Rocky Morel for dental on Tuesday to discuss management options.  I spoke to the admitting hospitalist at [facility] at 830 am, Dr [facility]   Patient updated and agrees for hospital stay.   Cyndia Bent, MD 01/01/22 [phone]

## 2022-01-01 NOTE — ED Notes (Signed)
She tells me the name of her methadone clinic is "[facility]". I have contacted [facility] pharmacy, who tell me they will send the methadone by courier asap.

## 2022-01-01 NOTE — Progress Notes (Signed)
Plan of Care Note for accepted transfer   Patient: Alice Martinez MRN: [id number]   DOA: 01/01/2022  Facility requesting transfer: [facility] ED Requesting Provider: Dr. Renaye Rakers Reason for transfer: Odontogenic cellulitis  Facility course: 46 yo F with hx of HTN, Depression, GAD, polysubstance abuse on methadone who presents with right sided facial swelling for the past 2 days.   CT soft tissue neck with odontogenic cellulitis with  'Medial mandible dehiscence at the roots of the posterior right mandible molar with inflamed right sublingual and right > left submandibular spaces. Subperiosteal edema tracking along the medial mandible (series 5, image 86), and a 2 cm phlegmon tracking inferiorly from the body of the right mandible (series 5, image 77). But no well organized abscess at this time.' Afebrile, CBC with no leukocytosis.  CMP unremarkable other than hypokalemia of 2.5.  EDP initially discussed with ENT Dr. Jenne Pane and no recommendation from then since she does not have airway emergency or drainable abscess. Dr. Ross Marcus with oral surgery contacted but was actually not on call and on vacation out of country.  Discussed with adult dentistry Dr. Rocky Morel who agrees with antibiotics observation for 24 to 48 hours. Patient is a started on IV Unasyn.  Plan of care: The patient is accepted for admission to Progressive unit, at [facility]  EDP to continue management of symptoms while pt remains in the ED.   Author: Anselm Jungling, DO 01/01/2022  Check [email or web address] for on-call coverage.  Nursing staff, Please call [facility] Admits & Consults System-Wide number on Amion as soon as patient's arrival, so appropriate admitting provider can evaluate the pt.

## 2022-01-01 NOTE — Assessment & Plan Note (Signed)
History of polysubstance abuse currently in remission.  Continue methadone prescribed by [facility].

## 2022-01-01 NOTE — ED Notes (Signed)
Called [facility] for Hospitalist for consult spoke with Phoebe Sharps

## 2022-01-01 NOTE — Assessment & Plan Note (Addendum)
Bilateral upper and lower extremity edema worse with laying and improves at rest. -Right hand paresthesia to the second to third digit with worsening on flexion of the wrists most concerning for carpal tunnel syndrome.  However symptoms are both upper and lower extremity.  Check cervical spine x-ray. -Also check TSH with history of hypothyroidism -Also has hypocalcemia.  Will replete and check magnesium.

## 2022-01-02 ENCOUNTER — Inpatient Hospital Stay (HOSPITAL_COMMUNITY): Payer: Commercial Managed Care - HMO

## 2022-01-02 DIAGNOSIS — J810 Acute pulmonary edema: Secondary | ICD-10-CM

## 2022-01-02 DIAGNOSIS — E8770 Fluid overload, unspecified: Secondary | ICD-10-CM

## 2022-01-02 LAB — BASIC METABOLIC PANEL
Anion gap: 3 — ABNORMAL LOW (ref 5–15)
BUN: 7 mg/dL (ref 6–20)
CO2: 25 mmol/L (ref 22–32)
Calcium: 8.2 mg/dL — ABNORMAL LOW (ref 8.9–10.3)
Chloride: 108 mmol/L (ref 98–111)
Creatinine, Ser: 0.9 mg/dL (ref 0.44–1.00)
GFR, Estimated: >60 mL/min (ref >60)
Glucose, Bld: 95 mg/dL (ref 70–99)
Potassium: 3.5 mmol/L (ref 3.5–5.1)
Sodium: 136 mmol/L (ref 135–145)

## 2022-01-02 LAB — BRAIN NATRIURETIC PEPTIDE: B Natriuretic Peptide: 117.6 pg/mL — ABNORMAL HIGH (ref 0.0–100.0)

## 2022-01-02 LAB — TSH: TSH: 0.862 u[IU]/mL (ref 0.350–4.500)

## 2022-01-02 MED ORDER — KETOROLAC TROMETHAMINE 15 MG/ML IJ SOLN
15.0000 mg | Freq: Once | INTRAMUSCULAR | Status: AC | PRN
  Administered 2022-01-02: 15 mg via INTRAVENOUS
  Filled 2022-01-02: qty 1

## 2022-01-02 MED ORDER — FUROSEMIDE 10 MG/ML IJ SOLN
20.0000 mg | Freq: Two times a day (BID) | INTRAMUSCULAR | Status: DC
  Administered 2022-01-02 – 2022-01-04 (×4): 20 mg via INTRAVENOUS
  Filled 2022-01-02 (×4): qty 2

## 2022-01-02 MED ORDER — ACETAMINOPHEN 500 MG PO TABS
1000.0000 mg | ORAL_TABLET | Freq: Three times a day (TID) | ORAL | Status: DC | PRN
Start: 2022-01-02 — End: 2022-01-04
  Administered 2022-01-02 (×2): 1000 mg via ORAL
  Filled 2022-01-02 (×2): qty 2

## 2022-01-02 NOTE — Plan of Care (Signed)

## 2022-01-02 NOTE — Progress Notes (Signed)
PROGRESS NOTE    Alice Martinez  ZTI:[id number] DOB: 15-Oct-1975 DOA: 01/01/2022 PCP: Mitzi Hansen, NP  Outpatient Specialists:     Brief Narrative:  As per H&P done on admission: "Alice Martinez is a 46 y.o. female with medical history significant of 46 yo F with hx of HTN, Depression, GAD, polysubstance abuse on methadone who presents with right sided facial swelling for the past 2 days.    Reports issues with cavity since last year but did not get it repaired. Last 2 days and not felt well with decrease appetite. Also complains of increase swelling to hands and feet for 2 weeks worse when laying down but improves on standing. Has numbness worse on right hand going into her first and second digit, slightly worse with wrist flexion. More numb on right foot. Has bilateral calf pain. Denies chest pain, shortness of breath. No neck pain or trauma. Uses tobacco but has been clean from alcohol or illicit drug use for over a year.    CT soft tissue neck with odontogenic cellulitis with deep tissue infection but no abscess.  Afebrile, CBC with no leukocytosis.  CMP unremarkable other than hypokalemia of 2.5.   EDP initially discussed with ENT Dr. Jenne Pane and no intervention recommended since she does not have airway emergency or drainable abscess. Dr. Ross Marcus with oral surgery contacted but was actually not on call and on vacation out of country.  Discussed with adult dentistry Dr. Rocky Morel who agrees with antibiotics observation for 24 to 48 hours. Patient started on IV Unasyn.   Transfer then made here to [facility] for continual management".  01/02/2022: Patient seen.  No new complaints.  Etiology of the extremity swollen remains unclear.  We will check urinalysis for protein, will also check cardiac BNP and chest x-ray.  Further management will depend on above.  Continue antibiotics.  Assessment & Plan:   Principal Problem:   Cellulitis Active Problems:   GAD (generalized anxiety disorder)    Polysubstance abuse (HCC)   Depression   Paresthesia   Hypocalcemia   Hypothyroidism  Cellulitis odontogenic cellulitis with deep tissue infection along the right mandible and submandibular space.  No issues with dysphagia or respiratory distress at this time.  Continue to monitor closely. -Continue IV Unasyn -EDP discussed with  Dr. Jenne Pane and no intervention recommended since she does not have airway emergency or drainable abscess. Dr. Ross Marcus with oral surgery contacted but was actually not on call and on vacation out of country.  Discussed with adult dentistry Dr. Rocky Morel who agrees with antibiotics observation for 24 to 48 hours. Dr. Chales Salmon is on call [place] and is the hospitalist dentist.  01/02/2022: Continue antibiotics.       Hypothyroidism Continue home levothyroxine   Hypocalcemia Replete with IV calcium.  Magnesium is 1.9. We will check 25-hydroxy vitamin D level.   Paresthesia Bilateral upper and lower extremity edema worse with laying and improves at rest. -Right hand paresthesia to the second to third digit with worsening on flexion of the wrists most concerning for carpal tunnel syndrome.  However symptoms are both upper and lower extremity.  Check cervical spine x-ray. -Also check TSH with history of hypothyroidism -Also has hypocalcemia.  Will replete and check magnesium. 01/02/2022: Cervical x-rays nonrevealing.  TSH and free T4 both normal.  Continue to assess.    Depression Continue home meds   Polysubstance abuse (HCC) History of polysubstance abuse currently in remission.  Continue methadone prescribed by [facility].   GAD (  generalized anxiety disorder) Continue home meds   Edema of the extremities: -Patient reports orthopnea. -Check BMP, chest x-ray and urine protein. -Rule out CHF and significant proteinuria.  Pulmonary edema: Chest x-ray reveals pulmonary edema. We will start patient on diuretics. Follow cardiac BNP Echocardiogram.   DVT  prophylaxis: Subcu Lovenox Code Status: Full code Family Communication:  Disposition Plan: Home eventually   Consultants:  None  Procedures:  None  Antimicrobials:  IV Unasyn   Subjective: No new complaints  Objective: Vitals:   01/01/22 2100 01/02/22 0234 01/02/22 0526 01/02/22 0600  BP: (!) 142/75 132/69 (!) 101/53   Pulse: 93 91 93 94  Resp:  14  15  Temp: 98.1 F (36.7 C) 99.1 F (37.3 C) 98.9 F (37.2 C)   TempSrc: Oral Oral Oral   SpO2: 99% 96% 96% 93%  Weight:      Height:        Intake/Output Summary (Last 24 hours) at 01/02/2022 1534 Last data filed at 01/02/2022 0404 Gross per 24 hour  Intake 220 ml  Output --  Net 220 ml   Filed Weights   01/01/22 0046  Weight: 77.1 kg    Examination:  General exam: Appears calm and comfortable.  Swelling of the upper and lower extremities.  Mild right-sided facial swelling. Respiratory system: Clear to auscultation. Respiratory effort normal. Cardiovascular system: S1 & S2 heard Gastrointestinal system: Abdomen is nondistended, soft and nontender. No organomegaly or masses felt. Normal bowel sounds heard. Central nervous system: Alert and oriented.  Patient moves all extremities. Extremities: Edema of the upper and lower extremities.  Data Reviewed: I have personally reviewed following labs and imaging studies  CBC: Recent Labs  Lab 01/01/22 0445  WBC 7.4  NEUTROABS 4.9  HGB 13.1  HCT 38.6  MCV 88.1  PLT 267   Basic Metabolic Panel: Recent Labs  Lab 01/01/22 0445 01/01/22 1242 01/02/22 0052  NA 139  --  136  K 3.5  --  3.5  CL 104  --  108  CO2 28  --  25  GLUCOSE 93  --  95  BUN 6  --  7  CREATININE 0.75  --  0.90  CALCIUM 8.5*  --  8.2*  MG  --  1.9  --    GFR: Estimated Creatinine Clearance: 74.9 mL/min (by C-G formula based on SCr of 0.9 mg/dL). Liver Function Tests: Recent Labs  Lab 01/01/22 0445  AST 22  ALT 15  ALKPHOS 52  BILITOT 0.6  PROT 5.6*  ALBUMIN 3.8   No results  for input(s): "LIPASE", "AMYLASE" in the last 168 hours. No results for input(s): "AMMONIA" in the last 168 hours. Coagulation Profile: No results for input(s): "INR", "PROTIME" in the last 168 hours. Cardiac Enzymes: No results for input(s): "CKTOTAL", "CKMB", "CKMBINDEX", "TROPONINI" in the last 168 hours. BNP (last 3 results) No results for input(s): "PROBNP" in the last 8760 hours. HbA1C: No results for input(s): "HGBA1C" in the last 72 hours. CBG: No results for input(s): "GLUCAP" in the last 168 hours. Lipid Profile: No results for input(s): "CHOL", "HDL", "LDLCALC", "TRIG", "CHOLHDL", "LDLDIRECT" in the last 72 hours. Thyroid Function Tests: Recent Labs    01/01/22 1242  TSH 0.862  FREET4 0.63   Anemia Panel: No results for input(s): "VITAMINB12", "FOLATE", "FERRITIN", "TIBC", "IRON", "RETICCTPCT" in the last 72 hours. Urine analysis:    Component Value Date/Time   COLORURINE YELLOW 04/04/2021 1457   APPEARANCEUR CLEAR 04/04/2021 1457   LABSPEC 1.018  04/04/2021 1457   PHURINE 6.5 04/04/2021 1457   GLUCOSEU NEGATIVE 04/04/2021 1457   HGBUR NEGATIVE 04/04/2021 1457   BILIRUBINUR NEGATIVE 04/04/2021 1457   KETONESUR NEGATIVE 04/04/2021 1457   PROTEINUR TRACE (A) 04/04/2021 1457   UROBILINOGEN 0.2 08/28/2010 1100   NITRITE NEGATIVE 04/04/2021 1457   LEUKOCYTESUR NEGATIVE 04/04/2021 1457   Sepsis Labs: @LABRCNTIP (procalcitonin:4,lacticidven:4)  )No results found for this or any previous visit (from the past 240 hour(s)).       Radiology Studies: DG CHEST PORT 1 VIEW  Result Date: 01/02/2022 CLINICAL DATA:  Orthopnea, facial swelling, shortness of breath EXAM: PORTABLE CHEST 1 VIEW COMPARISON:  Radiograph 09/15/2020 FINDINGS: Unchanged cardiomediastinal silhouette. There are mild diffuse interstitial opacities and lower lung predominant alveolar opacities. No large pleural effusion. No pneumothorax. No acute osseous abnormality. IMPRESSION: Mild to moderate  pulmonary edema. Electronically Signed   By: 09/17/2020 M.D.   On: 01/02/2022 13:43   DG Cervical Spine 2 or 3 views  Result Date: 01/01/2022 CLINICAL DATA:  Paresthesia, facial swelling. EXAM: CERVICAL SPINE - 2-3 VIEW COMPARISON:  None Available. FINDINGS: There is no evidence of cervical spine fracture or prevertebral soft tissue swelling. Alignment is normal. No other significant bone abnormalities are identified. IMPRESSION: Negative cervical spine radiographs. Electronically Signed   By: 03/03/2022 M.D.   On: 01/01/2022 14:36   CT Soft Tissue Neck W Contrast  Result Date: 01/01/2022 CLINICAL DATA:  46 year old female with right side neck pain and swelling. EXAM: CT NECK WITH CONTRAST TECHNIQUE: Multidetector CT imaging of the neck was performed using the standard protocol following the bolus administration of intravenous contrast. RADIATION DOSE REDUCTION: This exam was performed according to the departmental dose-optimization program which includes automated exposure control, adjustment of the mA and/or kV according to patient size and/or use of iterative reconstruction technique. CONTRAST:  66mL OMNIPAQUE IOHEXOL 300 MG/ML  SOLN COMPARISON:  None Available. FINDINGS: Pharynx and larynx: Within normal limits. Negative parapharyngeal and retropharyngeal space. Salivary glands: Most of the sublingual space remains normal. However, both submandibular glands appear to be hyperenhancing and inflamed. There is mild ductal dilatation in the right gland (series 3, image 55). But no dilatation of the more anterior submandibular ducts. No sialolithiasis identified. Furthermore there is a 2 cm heterogeneously enhancing phlegmon tracking inferior to the body of the right mandible (series 5 image 77) with thickening of the platysma. And there is abnormal right anterior sublingual space soft tissue edema or subperiosteal fluid on image 86. See right dental findings below. Remainder of the right masticator space  is normal. Left masticator in both parotid spaces remain normal. Thyroid: Negative. Lymph nodes: Reactive appearing level 1 and right level 2 lymph nodes. No cystic or necrotic nodes. Lymph node size up to 7 mm short axis. Thickening of the platysma in the submental region greater on the right. Generalized subcutaneous soft tissue swelling, stranding. Vascular: Major vascular structures in the neck and at the skull base remain patent. Limited intracranial: Negative. Visualized orbits: Negative. Mastoids and visualized paranasal sinuses: Clear throughout. Skeleton: Right mandible posterior molar periapical lucency and medial cortical dehiscence of the mandible alveolar process (series 5, image 75 and series 4, image 42). Regional soft tissue inflammation and heterogeneity. No other acute dental finding. No other No acute osseous abnormality identified. Upper chest: Negative. IMPRESSION: Odontogenic Cellulitis. Medial mandible dehiscence at the roots of the posterior right mandible molar with inflamed right sublingual and right > left submandibular spaces. Subperiosteal edema tracking along the medial  mandible (series 5, image 86), and a 2 cm phlegmon tracking inferiorly from the body of the right mandible (series 5, image 77). But no well organized abscess at this time. Secondary inflammation of the submandibular glands. Reactive regional lymph nodes. Electronically Signed   By: Odessa Fleming M.D.   On: 01/01/2022 06:15        Scheduled Meds:  enoxaparin (LOVENOX) injection  40 mg Subcutaneous Q24H   methadone  35 mg Oral Daily   QUEtiapine  200 mg Oral QHS   Continuous Infusions:  ampicillin-sulbactam (UNASYN) IV 3 g (01/02/22 0404)     LOS: 1 day    Time spent: 35 minutes.    Berton Mount, MD  [facility] Pager #: [phone] 7PM-7AM contact night coverage as above

## 2022-01-03 ENCOUNTER — Inpatient Hospital Stay (HOSPITAL_COMMUNITY): Payer: Commercial Managed Care - HMO

## 2022-01-03 DIAGNOSIS — I5021 Acute systolic (congestive) heart failure: Secondary | ICD-10-CM

## 2022-01-03 LAB — RENAL FUNCTION PANEL
Albumin: 3 g/dL — ABNORMAL LOW (ref 3.5–5.0)
Anion gap: 8 (ref 5–15)
BUN: 9 mg/dL (ref 6–20)
CO2: 27 mmol/L (ref 22–32)
Calcium: 8.5 mg/dL — ABNORMAL LOW (ref 8.9–10.3)
Chloride: 105 mmol/L (ref 98–111)
Creatinine, Ser: 0.8 mg/dL (ref 0.44–1.00)
GFR, Estimated: >60 mL/min (ref >60)
Glucose, Bld: 94 mg/dL (ref 70–99)
Phosphorus: 3.5 mg/dL (ref 2.5–4.6)
Potassium: 3.6 mmol/L (ref 3.5–5.1)
Sodium: 140 mmol/L (ref 135–145)

## 2022-01-03 LAB — URINALYSIS, ROUTINE W REFLEX MICROSCOPIC
Bacteria, UA: NONE SEEN
Bilirubin Urine: NEGATIVE
Glucose, UA: NEGATIVE mg/dL
Ketones, ur: NEGATIVE mg/dL
Leukocytes,Ua: NEGATIVE
Nitrite: NEGATIVE
Protein, ur: 30 mg/dL — AB
RBC / HPF: >50 RBC/hpf — ABNORMAL HIGH (ref 0–5)
Specific Gravity, Urine: 1.018 (ref 1.005–1.030)
pH: 9 — ABNORMAL HIGH (ref 5.0–8.0)

## 2022-01-03 LAB — ECHOCARDIOGRAM COMPLETE
Area-P 1/2: 2.95 cm2
Calc EF: 58.5 %
Height: 61 in
S' Lateral: 2.9 cm
Single Plane A2C EF: 55.8 %
Single Plane A4C EF: 61.5 %
Weight: 2720 oz

## 2022-01-03 LAB — MAGNESIUM: Magnesium: 1.8 mg/dL (ref 1.7–2.4)

## 2022-01-03 MED ORDER — METHADONE HCL 10 MG PO TABS
35.0000 mg | ORAL_TABLET | Freq: Once | ORAL | Status: AC
  Administered 2022-01-03: 35 mg via ORAL
  Filled 2022-01-03: qty 4

## 2022-01-03 NOTE — Progress Notes (Signed)
PROGRESS NOTE    PASQUALINA COLASURDO  RUE:[id number] DOB: 04-10-1976 DOA: 01/01/2022 PCP: Mitzi Hansen, NP  Outpatient Specialists:     Brief Narrative:  As per H&P done on admission: "[name] is a 46 y.o. female with medical history significant of 46 yo F with hx of HTN, Depression, GAD, polysubstance abuse on methadone who presents with right sided facial swelling for the past 2 days.    Reports issues with cavity since last year but did not get it repaired. Last 2 days and not felt well with decrease appetite. Also complains of increase swelling to hands and feet for 2 weeks worse when laying down but improves on standing. Has numbness worse on right hand going into her first and second digit, slightly worse with wrist flexion. More numb on right foot. Has bilateral calf pain. Denies chest pain, shortness of breath. No neck pain or trauma. Uses tobacco but has been clean from alcohol or illicit drug use for over a year.    CT soft tissue neck with odontogenic cellulitis with deep tissue infection but no abscess.  Afebrile, CBC with no leukocytosis.  CMP unremarkable other than hypokalemia of 2.5.   EDP initially discussed with ENT Dr. Jenne Pane and no intervention recommended since she does not have airway emergency or drainable abscess. Dr. Ross Marcus with oral surgery contacted but was actually not on call and on vacation out of country.  Discussed with adult dentistry Dr. Rocky Morel who agrees with antibiotics observation for 24 to 48 hours. Patient started on IV Unasyn.   Transfer then made here to [facility] cone for continual management".  01/02/2022: Patient seen.  No new complaints.  Etiology of the extremity swollen remains unclear.  We will check urinalysis for protein, will also check cardiac BNP and chest x-ray.  Further management will depend on above.  Continue antibiotics.  01/03/2022: Patient seen.  Edema has improved.  Patient is still on IV Lasix.  Echocardiogram came back normal.   Urinalysis revealed protein.  We will proceed with urine protein creatinine ratio.  No new complaints today.  Assessment & Plan:   Principal Problem:   Cellulitis Active Problems:   GAD (generalized anxiety disorder)   Polysubstance abuse (HCC)   Depression   Paresthesia   Hypocalcemia   Hypothyroidism  Cellulitis odontogenic cellulitis with deep tissue infection along the right mandible and submandibular space.  No issues with dysphagia or respiratory distress at this time.  Continue to monitor closely. -Continue IV Unasyn -EDP discussed with  Dr. Jenne Pane and no intervention recommended since she does not have airway emergency or drainable abscess. Dr. Ross Marcus with oral surgery contacted but was actually not on call and on vacation out of country.  Discussed with adult dentistry Dr. Rocky Morel who agrees with antibiotics observation for 24 to 48 hours. Dr. Chales Salmon is on call [place] and is the hospitalist dentist.  01/03/2022: Continue antibiotics.       Hypothyroidism Continue home levothyroxine   Hypocalcemia Replete with IV calcium.  Magnesium is 1.9. We will check 25-hydroxy vitamin D level.   Paresthesia Bilateral upper and lower extremity edema worse with laying and improves at rest. -Right hand paresthesia to the second to third digit with worsening on flexion of the wrists most concerning for carpal tunnel syndrome.  However symptoms are both upper and lower extremity.  Check cervical spine x-ray. -Also check TSH with history of hypothyroidism -Also has hypocalcemia.  Will replete and check magnesium. 01/02/2022: Cervical x-rays nonrevealing.  TSH  and free T4 both normal.  Continue to assess.    Depression Continue home meds   Polysubstance abuse (HCC) History of polysubstance abuse currently in remission.  Continue methadone prescribed by [facility].   GAD (generalized anxiety disorder) Continue home meds   Edema of the extremities: -Patient reports  orthopnea. -Check BMP, chest x-ray and urine protein. -Rule out CHF and significant proteinuria.  Pulmonary edema/peripheral edema: Chest x-ray reveals pulmonary edema. We will start patient on diuretics. Follow cardiac BNP Echocardiogram. 01/03/2022: BNP is minimally elevated.  Echocardiogram was normal.  Urinalysis revealed some protein.  We will proceed with urine protein creatinine ratio.  Continue IV Lasix for now.   DVT prophylaxis: Subcu Lovenox Code Status: Full code Family Communication:  Disposition Plan: Home eventually   Consultants:  None  Procedures:  None  Antimicrobials:  IV Unasyn   Subjective: No new complaints  Objective: Vitals:   01/02/22 2327 01/03/22 0247 01/03/22 0825 01/03/22 1551  BP:  107/61 118/62 (!) 127/58  Pulse:  78 72 65  Resp: 12 11 20 16   Temp:  98.4 F (36.9 C) 98.6 F (37 C) 98.6 F (37 C)  TempSrc:  Oral Oral Oral  SpO2:  94% 97% 99%  Weight:      Height:        Intake/Output Summary (Last 24 hours) at 01/03/2022 1557 Last data filed at 01/03/2022 0620 Gross per 24 hour  Intake 740 ml  Output --  Net 740 ml    Filed Weights   01/01/22 0046  Weight: 77.1 kg    Examination:  General exam: Appears calm and comfortable.  Swelling of the upper and lower extremities is improving.   Respiratory system: Clear to auscultation. Respiratory effort normal. Cardiovascular system: S1 & S2 heard Gastrointestinal system: Abdomen is nondistended, soft and nontender. No organomegaly or masses felt. Normal bowel sounds heard. Central nervous system: Alert and oriented.  Patient moves all extremities. Extremities: Edema of the upper and lower extremities is improving.  Data Reviewed: I have personally reviewed following labs and imaging studies  CBC: Recent Labs  Lab 01/01/22 0445  WBC 7.4  NEUTROABS 4.9  HGB 13.1  HCT 38.6  MCV 88.1  PLT 267    Basic Metabolic Panel: Recent Labs  Lab 01/01/22 0445 01/01/22 1242  01/02/22 0052 01/03/22 0100  NA 139  --  136 140  K 3.5  --  3.5 3.6  CL 104  --  108 105  CO2 28  --  25 27  GLUCOSE 93  --  95 94  BUN 6  --  7 9  CREATININE 0.75  --  0.90 0.80  CALCIUM 8.5*  --  8.2* 8.5*  MG  --  1.9  --  1.8  PHOS  --   --   --  3.5    GFR: Estimated Creatinine Clearance: 84.3 mL/min (by C-G formula based on SCr of 0.8 mg/dL). Liver Function Tests: Recent Labs  Lab 01/01/22 0445 01/03/22 0100  AST 22  --   ALT 15  --   ALKPHOS 52  --   BILITOT 0.6  --   PROT 5.6*  --   ALBUMIN 3.8 3.0*    No results for input(s): "LIPASE", "AMYLASE" in the last 168 hours. No results for input(s): "AMMONIA" in the last 168 hours. Coagulation Profile: No results for input(s): "INR", "PROTIME" in the last 168 hours. Cardiac Enzymes: No results for input(s): "CKTOTAL", "CKMB", "CKMBINDEX", "TROPONINI" in the last 168  hours. BNP (last 3 results) No results for input(s): "PROBNP" in the last 8760 hours. HbA1C: No results for input(s): "HGBA1C" in the last 72 hours. CBG: No results for input(s): "GLUCAP" in the last 168 hours. Lipid Profile: No results for input(s): "CHOL", "HDL", "LDLCALC", "TRIG", "CHOLHDL", "LDLDIRECT" in the last 72 hours. Thyroid Function Tests: Recent Labs    01/01/22 1242  TSH 0.862  FREET4 0.63    Anemia Panel: No results for input(s): "VITAMINB12", "FOLATE", "FERRITIN", "TIBC", "IRON", "RETICCTPCT" in the last 72 hours. Urine analysis:    Component Value Date/Time   COLORURINE YELLOW 01/03/2022 1410   APPEARANCEUR HAZY (A) 01/03/2022 1410   LABSPEC 1.018 01/03/2022 1410   PHURINE 9.0 (H) 01/03/2022 1410   GLUCOSEU NEGATIVE 01/03/2022 1410   HGBUR MODERATE (A) 01/03/2022 1410   BILIRUBINUR NEGATIVE 01/03/2022 1410   KETONESUR NEGATIVE 01/03/2022 1410   PROTEINUR 30 (A) 01/03/2022 1410   UROBILINOGEN 0.2 08/28/2010 1100   NITRITE NEGATIVE 01/03/2022 1410   LEUKOCYTESUR NEGATIVE 01/03/2022 1410   Sepsis  Labs: @LABRCNTIP (procalcitonin:4,lacticidven:4)  )No results found for this or any previous visit (from the past 240 hour(s)).       Radiology Studies: ECHOCARDIOGRAM COMPLETE  Result Date: 01/03/2022    ECHOCARDIOGRAM REPORT   Patient Name:   CHARLISHA MARKET [facility] Date of Exam: 01/03/2022 Medical Rec #:  03/05/2022       Height:       61.0 in Accession #:    [id number]      Weight:       170.0 lb Date of Birth:  Jan 21, 1976        BSA:          1.763 m Patient Age:    46 years        BP:           107/61 mmHg Patient Gender: F               HR:           74 bpm. Exam Location:  Inpatient Procedure: 2D Echo, Cardiac Doppler and Color Doppler Indications:    CHF-Acute Systolic I50.21  History:        Patient has no prior history of Echocardiogram examinations.                 Risk Factors:Hypertension.  Sonographer:    03/02[birth year]1978 RDCS Referring Phys: Eulah Pont Kalil Woessner I Jamareon Shimel IMPRESSIONS  1. Left ventricular ejection fraction, by estimation, is 60 to 65%. The left ventricle has normal function. The left ventricle has no regional wall motion abnormalities. Left ventricular diastolic parameters were normal.  2. Right ventricular systolic function is normal. The right ventricular size is normal. There is normal pulmonary artery systolic pressure. The estimated right ventricular systolic pressure is 21.8 mmHg.  3. The mitral valve is normal in structure. Trivial mitral valve regurgitation. No evidence of mitral stenosis.  4. The aortic valve is tricuspid. Aortic valve regurgitation is not visualized. No aortic stenosis is present.  5. The inferior vena cava is normal in size with greater than 50% respiratory variability, suggesting right atrial pressure of 3 mmHg. FINDINGS  Left Ventricle: Left ventricular ejection fraction, by estimation, is 60 to 65%. The left ventricle has normal function. The left ventricle has no regional wall motion abnormalities. The left ventricular internal cavity size was normal in size.  There is  no left ventricular hypertrophy. Left ventricular diastolic parameters were normal. Right Ventricle: The right ventricular size is normal.  No increase in right ventricular wall thickness. Right ventricular systolic function is normal. There is normal pulmonary artery systolic pressure. The tricuspid regurgitant velocity is 2.17 m/s, and  with an assumed right atrial pressure of 3 mmHg, the estimated right ventricular systolic pressure is 21.8 mmHg. Left Atrium: Left atrial size was normal in size. Right Atrium: Right atrial size was normal in size. Pericardium: There is no evidence of pericardial effusion. Mitral Valve: The mitral valve is normal in structure. Trivial mitral valve regurgitation. No evidence of mitral valve stenosis. Tricuspid Valve: The tricuspid valve is normal in structure. Tricuspid valve regurgitation is trivial. No evidence of tricuspid stenosis. Aortic Valve: The aortic valve is tricuspid. Aortic valve regurgitation is not visualized. No aortic stenosis is present. Pulmonic Valve: The pulmonic valve was normal in structure. Pulmonic valve regurgitation is trivial. No evidence of pulmonic stenosis. Aorta: The aortic root is normal in size and structure. Venous: The inferior vena cava is normal in size with greater than 50% respiratory variability, suggesting right atrial pressure of 3 mmHg. IAS/Shunts: No atrial level shunt detected by color flow Doppler.  LEFT VENTRICLE PLAX 2D LVIDd:         4.40 cm     Diastology LVIDs:         2.90 cm     LV e' medial:    9.11 cm/s LV PW:         1.00 cm     LV E/e' medial:  10.9 LV IVS:        0.60 cm     LV e' lateral:   15.10 cm/s LVOT diam:     1.90 cm     LV E/e' lateral: 6.6 LV SV:         54 LV SV Index:   30 LVOT Area:     2.84 cm  LV Volumes (MOD) LV vol d, MOD A2C: 70.6 ml LV vol d, MOD A4C: 71.1 ml LV vol s, MOD A2C: 31.2 ml LV vol s, MOD A4C: 27.4 ml LV SV MOD A2C:     39.4 ml LV SV MOD A4C:     71.1 ml LV SV MOD BP:      43.3 ml  RIGHT VENTRICLE RV S prime:     12.60 cm/s TAPSE (M-mode): 2.2 cm LEFT ATRIUM             Index        RIGHT ATRIUM           Index LA diam:        3.00 cm 1.70 cm/m   RA Area:     11.30 cm LA Vol (A2C):   41.5 ml 23.54 ml/m  RA Volume:   21.40 ml  12.14 ml/m LA Vol (A4C):   40.8 ml 23.15 ml/m LA Biplane Vol: 41.3 ml 23.43 ml/m  AORTIC VALVE LVOT Vmax:   86.40 cm/s LVOT Vmean:  58.600 cm/s LVOT VTI:    0.189 m  AORTA Ao Root diam: 2.90 cm Ao Asc diam:  2.90 cm MITRAL VALVE               TRICUSPID VALVE MV Area (PHT): 2.95 cm    TR Peak grad:   18.8 mmHg MV Decel Time: 257 msec    TR Vmax:        217.00 cm/s MV E velocity: 99.60 cm/s MV A velocity: 63.40 cm/s  SHUNTS MV E/A ratio:  1.57  Systemic VTI:  0.19 m                            Systemic Diam: 1.90 cm Weston Brass MD Electronically signed by Weston Brass MD Signature Date/Time: 01/03/2022/12:43:03 PM    Final    DG CHEST PORT 1 VIEW  Result Date: 01/02/2022 CLINICAL DATA:  Orthopnea, facial swelling, shortness of breath EXAM: PORTABLE CHEST 1 VIEW COMPARISON:  Radiograph 09/15/2020 FINDINGS: Unchanged cardiomediastinal silhouette. There are mild diffuse interstitial opacities and lower lung predominant alveolar opacities. No large pleural effusion. No pneumothorax. No acute osseous abnormality. IMPRESSION: Mild to moderate pulmonary edema. Electronically Signed   By: Caprice Renshaw M.D.   On: 01/02/2022 13:43        Scheduled Meds:  enoxaparin (LOVENOX) injection  40 mg Subcutaneous Q24H   furosemide  20 mg Intravenous Q12H   methadone  35 mg Oral Daily   QUEtiapine  200 mg Oral QHS   Continuous Infusions:  ampicillin-sulbactam (UNASYN) IV 3 g (01/03/22 1239)     LOS: 2 days    Time spent: 35 minutes.    Berton Mount, MD  [facility] Pager #: [phone] 7PM-7AM contact night coverage as above

## 2022-01-03 NOTE — Progress Notes (Signed)
Echocardiogram 2D Echocardiogram has been performed.  Alice Martinez 01/03/2022, 10:13 AM

## 2022-01-04 ENCOUNTER — Other Ambulatory Visit (HOSPITAL_COMMUNITY): Payer: Self-pay

## 2022-01-04 ENCOUNTER — Encounter (HOSPITAL_COMMUNITY): Payer: No Payment, Other | Admitting: Student in an Organized Health Care Education/Training Program

## 2022-01-04 ENCOUNTER — Encounter (HOSPITAL_COMMUNITY): Payer: Self-pay | Admitting: Family Medicine

## 2022-01-04 DIAGNOSIS — K122 Cellulitis and abscess of mouth: Secondary | ICD-10-CM

## 2022-01-04 LAB — PROTEIN / CREATININE RATIO, URINE
Creatinine, Urine: 113 mg/dL
Protein Creatinine Ratio: 0.1 mg/mg{Cre} (ref 0.00–0.15)
Total Protein, Urine: 11 mg/dL

## 2022-01-04 MED ORDER — FUROSEMIDE 20 MG PO TABS
20.0000 mg | ORAL_TABLET | Freq: Two times a day (BID) | ORAL | 0 refills | Status: DC | PRN
Start: 2022-01-04 — End: 2022-02-04
  Filled 2022-01-04: qty 20, 10d supply, fill #0

## 2022-01-04 MED ORDER — FUROSEMIDE 10 MG/ML IJ SOLN: 20.0000 mg | Freq: Two times a day (BID) | INTRAMUSCULAR | 0 refills | Status: DC | PRN

## 2022-01-04 MED ORDER — AMOXICILLIN-POT CLAVULANATE 875-125 MG PO TABS
1.0000 | ORAL_TABLET | Freq: Two times a day (BID) | ORAL | 0 refills | Status: DC
Start: 2022-01-04 — End: 2022-01-04

## 2022-01-04 MED ORDER — AMOXICILLIN-POT CLAVULANATE 875-125 MG PO TABS
1.0000 | ORAL_TABLET | Freq: Two times a day (BID) | ORAL | 0 refills | Status: AC
  Filled 2022-01-04: qty 20, 10d supply, fill #0

## 2022-01-04 MED ORDER — ORAL CARE MOUTH RINSE: 15.0000 mL | OROMUCOSAL | 0 refills | Status: DC | PRN

## 2022-01-04 NOTE — Progress Notes (Signed)
Mobility Specialist Progress Note   01/03/22 1130  Mobility  Activity Ambulated independently in hallway  Level of Assistance Independent after set-up  Assistive Device None  Distance Ambulated (ft) 550 ft  Activity Response Tolerated well  $Mobility charge 1 Mobility   Patient received in bed agreeable to participate in mobility. Ambulated in hallway independently with steady gait. Returned to room without complaint or incident. Was left back in bed with all needs met, call bell in reach.   Holland Falling Mobility Specialist MS [facility] #:  [phone] Acute Rehab Office:  [phone]

## 2022-01-04 NOTE — TOC Transition Note (Signed)
Transition of Care [facility]) - CM/SW Discharge Note   Patient Details  Name: Alice Martinez MRN: [id number] Date of Birth: 03-May-1975  [facility] CM/SW Contact:  Epifanio Lesches, RN Phone Number: 01/04/2022, 12:54 PM   Clinical Narrative:    Patient will DC to: home Anticipated DC date: 01/04/2022 Family notified: yes Transport by: car  Presented with right sided facial swelling /cellulitis, hx of HTN, Depression, GAD, polysubstance abuse on methadone  Per MD patient ready for DC today. RN, patient, and patient's sister aware of DC.   Pt without PCP, no health insurance .NCM shared [facility]. Pt interested . NCM arranged post hospital f/u appointment and noted on AVS. Pt states would like to stop smoking. Smoking cessation information provided to pt.   Dental resources provided to pt.  TOC pharmacy to deliver Rx meds to bedside prior to d/c.  Pt states will resume daily visits to [facility] for methadone once d/c.  Sister to provide transportation to home.  RNCM will sign off for now as intervention is no longer needed. Please consult [place] again if new needs arise.   Final next level of care: Home/Self Care Barriers to Discharge: No Barriers Identified   Patient Goals and CMS Choice        Discharge Placement                       Discharge Plan and Services                                     Social Determinants of Health (SDOH) Interventions     Readmission Risk Interventions     No data to display

## 2022-01-07 ENCOUNTER — Encounter (HOSPITAL_COMMUNITY): Payer: No Payment, Other | Admitting: Student in an Organized Health Care Education/Training Program

## 2022-01-07 ENCOUNTER — Encounter (HOSPITAL_COMMUNITY): Payer: Self-pay | Admitting: Student in an Organized Health Care Education/Training Program

## 2022-01-07 ENCOUNTER — Ambulatory Visit (INDEPENDENT_AMBULATORY_CARE_PROVIDER_SITE_OTHER): Payer: No Payment, Other | Admitting: Student in an Organized Health Care Education/Training Program

## 2022-01-07 ENCOUNTER — Other Ambulatory Visit: Payer: Self-pay

## 2022-01-07 VITALS — BP 137/80 | HR 92 | Ht 61.0 in | Wt 167.8 lb

## 2022-01-07 DIAGNOSIS — F4312 Post-traumatic stress disorder, chronic: Secondary | ICD-10-CM | POA: Diagnosis not present

## 2022-01-07 DIAGNOSIS — F3170 Bipolar disorder, currently in remission, most recent episode unspecified: Secondary | ICD-10-CM | POA: Diagnosis not present

## 2022-01-07 DIAGNOSIS — G4700 Insomnia, unspecified: Secondary | ICD-10-CM

## 2022-01-07 DIAGNOSIS — F411 Generalized anxiety disorder: Secondary | ICD-10-CM

## 2022-01-07 DIAGNOSIS — F331 Major depressive disorder, recurrent, moderate: Secondary | ICD-10-CM

## 2022-01-07 MED ORDER — TRAZODONE HCL 50 MG PO TABS
50.0000 mg | ORAL_TABLET | Freq: Every day | ORAL | 1 refills | Status: DC
  Filled 2022-01-07: qty 30, 30d supply, fill #0
  Filled 2022-02-02: qty 30, 30d supply, fill #1

## 2022-01-07 MED ORDER — QUETIAPINE FUMARATE 200 MG PO TABS
200.0000 mg | ORAL_TABLET | Freq: Every day | ORAL | 1 refills | Status: DC
  Filled 2022-01-07: qty 30, 30d supply, fill #0
  Filled 2022-02-02: qty 30, 30d supply, fill #1

## 2022-01-07 MED ORDER — BUSPIRONE HCL 5 MG PO TABS
5.0000 mg | ORAL_TABLET | Freq: Every day | ORAL | 1 refills | Status: DC
  Filled 2022-01-07: qty 30, 30d supply, fill #0
  Filled 2022-02-28: qty 30, 30d supply, fill #1

## 2022-01-07 MED ORDER — DULOXETINE HCL 20 MG PO CPEP
20.0000 mg | ORAL_CAPSULE | Freq: Two times a day (BID) | ORAL | 1 refills | Status: DC
  Filled 2022-01-07: qty 60, 30d supply, fill #0
  Filled 2022-02-02: qty 60, 30d supply, fill #1

## 2022-01-07 NOTE — Progress Notes (Signed)
BH MD/PA/NP OP Progress Note  01/07/2022 2:09 PM Alice Martinez  MRN:  [id number]  Chief Complaint:  Chief Complaint  Patient presents with   Follow-up   HPI:  Alice Martinez is a 46 yr old female who presents for follow up and medication management.  PPHx is significant for Bipolar Disorder, PTSD, History of Polysubstance Abuse (EtOH, Fentanyl, Heroin, Xanax, Cocaine), GAD, MDD, and OCD, prior suicide attempts (OD x2 Dec 2022), 2 hospitalizations (latest [facility] 06/2020), and [facility] 08/2021).  She reports that she is doing okay today.  She reports that last weekend she was hospitalized and diagnosed with congestive heart failure.  She reports that she is been very anxious about this.  She reports well her mood has been lower this past week it has still remained stable.  She reports no side effects with her psychiatric medications.  She reports that her sleep has been a little worse recently due to her hospitalization.  She reports she is having significant neuropathic pain in her hands and feet.  She reports a follow-up appointment is in 2 weeks but she has a lot of concerns about CHF.  Discussed with her that since we had not started an antidepressant last time as we wanted to ensure mood stability first we discussed starting one this time.  Discussed with her that given her neuropathic pain we could trial duloxetine as this would cover her depression, anxiety, and pain.  Discussed potential side effects and risks with her and she was agreeable to a trial.  Discussed we would keep her Seroquel where it is for now.  Discussed we would also keep her trazodone and BuSpar the same for right now.  She reports no SI, HI, or AVH.  She reports her appetite was low while hospitalized but is picking back up again.  She reports her sleep has been fair since the hospitalization.  She reports no other concerns at present.   Visit Diagnosis:    ICD-10-CM   1. Major depressive disorder,  recurrent episode, moderate (HCC)  F33.1 DULoxetine (CYMBALTA) 20 MG capsule    2. Bipolar affective disorder in remission (HCC)  F31.70 QUEtiapine (SEROQUEL) 200 MG tablet    3. Chronic post-traumatic stress disorder (PTSD)  F43.12 QUEtiapine (SEROQUEL) 200 MG tablet    DULoxetine (CYMBALTA) 20 MG capsule    4. GAD (generalized anxiety disorder)  F41.1 QUEtiapine (SEROQUEL) 200 MG tablet    DULoxetine (CYMBALTA) 20 MG capsule    busPIRone (BUSPAR) 5 MG tablet    5. Insomnia, unspecified type  G47.00 traZODone (DESYREL) 50 MG tablet      Past Psychiatric History: Bipolar Disorder, PTSD, History of Polysubstance Abuse (EtOH, Fentanyl, Heroin, Xanax, Cocaine), GAD, MDD, and OCD, prior suicide attempts (OD x2 Dec 2022), 2 hospitalizations (latest [facility] 06/2020), and [facility] 08/2021).  Past Medical History:  Past Medical History:  Diagnosis Date   ADHD (attention deficit hyperactivity disorder)    Depression    High cholesterol    Hypertension    Thyroid disease     Past Surgical History:  Procedure Laterality Date   TONSILLECTOMY     TUBAL LIGATION      Family Psychiatric History: Mother- unknown diagnosis and Substance Use Father- Depression and EtOH Abuse No Known Suicides  Family History:  Family History  Family history unknown: Yes    Social History:  Social History   Socioeconomic History   Marital status: Divorced    Spouse name: Not on file  Number of children: Not on file   Years of education: Not on file   Highest education level: Not on file  Occupational History   Not on file  Tobacco Use   Smoking status: Every Day    Packs/day: 1.00    Years: 25.00    Total pack years: 25.00    Types: Cigarettes   Smokeless tobacco: Never  Vaping Use   Vaping Use: Never used  Substance and Sexual Activity   Alcohol use: Not Currently   Drug use: No   Sexual activity: Yes    Birth control/protection: Surgical  Other Topics Concern   Not on  file  Social History Narrative   Not on file   Social Determinants of Health   Financial Resource Strain: Not on file  Food Insecurity: Not on file  Transportation Needs: Not on file  Physical Activity: Not on file  Stress: Not on file  Social Connections: Not on file    Allergies: No Known Allergies  Metabolic Disorder Labs: Lab Results  Component Value Date   HGBA1C 5.7 (H) 06/07/2020   MPG 116.89 06/07/2020   No results found for: "PROLACTIN" Lab Results  Component Value Date   CHOL 278 (H) 06/07/2020   TRIG 214 (H) 06/07/2020   HDL 61 06/07/2020   CHOLHDL 4.6 06/07/2020   VLDL 43 (H) 06/07/2020   LDLCALC 174 (H) 06/07/2020   Lab Results  Component Value Date   TSH 0.862 01/01/2022   TSH 2.028 04/04/2021    Therapeutic Level Labs: No results found for: "LITHIUM" No results found for: "VALPROATE" No results found for: "CBMZ"  Current Medications: Current Outpatient Medications  Medication Sig Dispense Refill   DULoxetine (CYMBALTA) 20 MG capsule Take 1 capsule (20 mg total) by mouth 2 (two) times daily. 60 capsule 1   traZODone (DESYREL) 50 MG tablet Take 1 tablet (50 mg total) by mouth at bedtime. 30 tablet 1   amoxicillin-clavulanate (AUGMENTIN) 875-125 MG tablet Take 1 tablet by mouth 2 (two) times daily for 10 days. 20 tablet 0   busPIRone (BUSPAR) 5 MG tablet Take 1 tablet (5 mg total) by mouth at bedtime. 30 tablet 1   furosemide (LASIX) 20 MG tablet Take 1 tablet (20 mg total) by mouth 2 (two) times daily as needed for up to 10 days. 20 tablet 0   levothyroxine (SYNTHROID) 25 MCG tablet Take 25 mcg by mouth daily before breakfast.     methadone (DOLOPHINE) 10 MG/ML solution Take 30 mg by mouth daily.     Mouthwashes (MOUTH RINSE) LIQD solution 15 mLs by Mouth Rinse route as needed (for oral care). 473 mL 0   QUEtiapine (SEROQUEL) 200 MG tablet Take 1 tablet (200 mg total) by mouth at bedtime. 30 tablet 1   No current facility-administered medications  for this visit.     Musculoskeletal: Strength & Muscle Tone: within normal limits Gait & Station: normal Patient leans: N/A  Psychiatric Specialty Exam: Review of Systems  Respiratory:  Negative for shortness of breath.   Cardiovascular:  Negative for chest pain.  Gastrointestinal:  Negative for abdominal pain, constipation, diarrhea, nausea and vomiting.  Neurological:  Negative for dizziness, weakness and headaches.  Psychiatric/Behavioral:  Positive for dysphoric mood. Negative for self-injury, sleep disturbance and suicidal ideas. The patient is nervous/anxious.     Blood pressure 137/80, pulse 92, height 5\' 1"  (1.549 m), weight 167 lb 12.8 oz (76.1 kg), last menstrual period 12/20/2021, SpO2 98 %.Body mass index is 31.71 kg/m.  General Appearance: Casual and Fairly Groomed  Eye Contact:  Good  Speech:  Clear and Coherent and Normal Rate  Volume:  Normal  Mood:  Anxious and Dysphoric  Affect:  Congruent  Thought Process:  Coherent and Goal Directed  Orientation:  Full (Time, Place, and Person)  Thought Content: Logical   Suicidal Thoughts:  No  Homicidal Thoughts:  No  Memory:  Immediate;   Good Recent;   Good  Judgement:  Fair  Insight:  Fair  Psychomotor Activity:  Normal  Concentration:  Concentration: Good and Attention Span: Good  Recall:  Good  Fund of Knowledge: Good  Language: Good  Akathisia:  Negative  Handed:  Right  AIMS (if indicated): done AIMS=0   Assets:  Communication Skills Desire for Improvement Resilience Social Support Vocational/Educational  ADL's:  Intact  Cognition: WNL  Sleep:  Fair   Screenings: AIMS    Flowsheet Row [facility] (Dis[facility]  AIMS Total Score 0      AUDIT    Flowsheet Row Admission (Discharged) from [facility]  Alcohol Use Disorder Identification Test Final Score (AUDIT) 0       GAD-7    Flowsheet Row Office Visit from 11/12/2021 in [facility] Office Visit from 02/25/2021 in [facility] [place] Office Visit from 05/26/2016 in [facility] [place]  Total GAD-7 Score 0 21 18      PHQ2-9    Flowsheet Row Office Visit from 11/12/2021 in [facility] Counselor from 05/05/2021 in [facility] [place] Office Visit from 02/25/2021 in [facility] [place] Counselor from 01/18/2021 in [facility] [place] Video Visit from 01/15/2021 in [facility] [place]  PHQ-2 Total Score 0 2 4 1 1   PHQ-9 Total Score -- 8 16 -- --      Flowsheet Row ED to Hosp-Admission (Discharged) from 01/01/2022 in [facility] Video Visit from 06/02/2021 in [facility] [place] Counselor from 05/05/2021 in [facility] AT   C-SSRS RISK CATEGORY No Risk Error: Q3, 4, or 5 should not be populated when Q2 is No Error: Q3, 4, or 5 should not be populated when Q2 is No        Assessment and Plan:  Maria Coin is a 46 yr old female who presents for follow up and medication management.  PPHx is significant for Bipolar Disorder, PTSD, History of Polysubstance Abuse (EtOH, Fentanyl, Heroin, Xanax, Cocaine), GAD, MDD, and OCD, prior suicide attempts (OD x2 Dec 2022), 2 hospitalizations (latest [facility] 06/2020), and [facility] 08/2021).   Eriyanna was recently hospitalized for congestive heart failure with pulmonary edema.  Her mood has been lower the past week with all of this going on, however, she reports her mood has remained stable.  For her depression, anxiety, and neuropathic pain in her hands and feet we will start twice a day Cymbalta.  We will continue  with her Seroquel.  We will continue with her trazodone.  We will continue with her BuSpar.  She will return for follow-up in approximately 4 weeks.     Bipolar Disorder  Chronic PTSD  MDD  GAD: -Start Cymbalta 20 mg BID for depression, anxiety, and neuropathic pain. 60 tablets with 1 refill. -Continue  Seroquel 200 mg QHS, mood stability and insomnia.  30 tablets with 1 refill. -Continue Buspar 5 mg QHS for anxiety.  30 tablets with 1 refill.     History of Polysubstance Abuse (EtOH, Fentanyl, Heroin, Xanax, Cocaine): -Sober for 5 months -Encouraged continuing abstinence from all substances      Insomnia: -Continue Trazodone 50 mg QHS.  30 tablets with 1 refill.   Collaboration of Care:   Patient/Guardian was advised Release of Information must be obtained prior to any record release in order to collaborate their care with an outside provider. Patient/Guardian was advised if they have not already done so to contact the registration department to sign all necessary forms in order for [place] to release information regarding their care.   Consent: Patient/Guardian gives verbal consent for treatment and assignment of benefits for services provided during this visit. Patient/Guardian expressed understanding and agreed to proceed.    Lauro Franklin, MD 01/07/2022, 2:09 PM

## 2022-01-10 ENCOUNTER — Other Ambulatory Visit: Payer: Self-pay

## 2022-01-11 NOTE — Discharge Summary (Addendum)
Physician Discharge Summary   Patient: Alice Martinez MRN: [id number] DOB: [birth year]  Admit date:     01/01/2022  Discharge date: 01/04/2022  Discharge Physician: Barnetta Chapel   PCP: Mitzi Hansen, NP   Recommendations at discharge:   Follow-up with your primary care provider and the dental team within 1 week of discharge. Complete course of antibiotics.  Discharge Diagnoses: Principal Problem:   Cellulitis Active Problems:   GAD (generalized anxiety disorder)   Polysubstance abuse (HCC)   Depression   Paresthesia   Hypocalcemia   Hypothyroidism  Resolved Problems:   * No resolved hospital problems. [facility] Course: Patient is a 46 year old female with past medical history significant for hypertension, depression, anxiety and polysubstance abuse on methadone.  Patient was admitted with 2-day history of right-sided facial swelling.  CT soft tissue neck revealed odontogenic cellulitis with deep tissue infection but no abscess.  ED physician initially discussed with ENT, Dr. Jenne Pane, and no intervention was recommended since patient did not have airway emergency or drainable abscess. Dr. Ross Marcus with oral surgery was contacted but was actually not on call and on vacation out of country.  Patient was discussed with adult dentistry, Dr. Rocky Morel, who agreed with antibiotics.  Patient was admitted and managed with IV Unasyn.  Patient will be discharged home on oral antibiotics.  Patient is eager to be discharged home.  Patient has improved significantly.  Patient will follow-up with primary care provider and dental team on discharge.    Assessment and Plan: Cellulitis: -odontogenic cellulitis with deep tissue infection along the right mandible and submandibular space. -No issues with dysphagia or respiratory distress at this time.   -Managed with IV Unasyn -EDP discussed with  Dr. Jenne Pane and no intervention recommended since she does not have airway emergency or drainable abscess. Dr.  Ross Marcus with oral surgery contacted but was actually not on call and on vacation out of country.  Discussed with adult dentistry Dr. Rocky Morel who agrees with antibiotics observation for 24 to 48 hours. Dr. Chales Salmon is on call [place] and is the hospitalist dentist.  -Patient improved significantly on antibiotics.  Patient be discharged home on oral antibiotics.   Hypothyroidism Continue home levothyroxine   Hypocalcemia Replete with IV calcium.  25-hydroxy vitamin D levels checked.   Magnesium was 1.9.     Paresthesia Bilateral upper and lower extremity edema worse with laying and improves at rest. -Right hand paresthesia to the second to third digit with worsening on flexion of the wrists most concerning for carpal tunnel syndrome.   However symptoms are both upper and lower extremity.  Cervical spine x-ray. -Check TSH with history of hypothyroidism -Also has mild hypocalcemia.   01/02/2022: Cervical x-rays nonrevealing.  TSH and free T4 both normal.   -PCP will continue to assess and manage.   Depression Continue home meds   Polysubstance abuse (HCC) History of polysubstance abuse currently in remission.   Methadone was continued during the hospital stay.     GAD (generalized anxiety disorder) Continued home meds   Edema of the extremities/mild proteinuria/minimally elevated BNP: -Patient reported orthopnea. -Urinalysis reveals mild proteinuria. -Urine protein creatinine ratio was normal. -Echocardiogram did not reveal any significant findings. -BMP revealed estimated GFR of greater than 60. -Albumin came back Today. -BNP was 117.6. -Patient was managed with diuretics. -Further management by the primary care provider.     Pulmonary edema/peripheral edema, acute: -Chest x-ray reveals pulmonary edema. -Patient was managed with diuretics.   01/03/2022: BNP is minimally  elevated.  Echocardiogram was normal.  Urinalysis revealed some protein.  We will proceed with urine protein  creatinine ratio.  Continue IV Lasix for now.           Consultants: Dental team was contacted on presentation. Procedures performed: None Disposition: Home Diet recommendation:  Discharge Diet Orders (From admission, onward)     Start     Ordered   01/04/22 0000  Diet - low sodium heart healthy        01/04/22 1157           Cardiac diet DISCHARGE MEDICATION: Allergies as of 01/04/2022   No Known Allergies      Medication List     STOP taking these medications    pravastatin 20 MG tablet Commonly known as: PRAVACHOL   traZODone 50 MG tablet Commonly known as: DESYREL       TAKE these medications    amoxicillin-clavulanate 875-125 MG tablet Commonly known as: AUGMENTIN Take 1 tablet by mouth 2 (two) times daily for 10 days.   furosemide 20 MG tablet Commonly known as: Lasix Take 1 tablet (20 mg total) by mouth 2 (two) times daily as needed for up to 10 days.   levothyroxine 25 MCG tablet Commonly known as: SYNTHROID Take 25 mcg by mouth daily before breakfast.   methadone 10 MG/ML solution Commonly known as: DOLOPHINE Take 30 mg by mouth daily.   mouth rinse Liqd solution 15 mLs by Mouth Rinse route as needed (for oral care).        Follow-up Information     [facility] Follow up on 01/31/2022.   Why: 9: 30 am with Dr. Jonah Blue Contact information: [address] [employer] [address] [place] [address] [phone]        [facility] Follow up.   Why: Please resume daily visits Contact information: [address] [place], [place] [address] [phone]               Discharge Exam: Ceasar Mons Weights   01/01/22 0046  Weight: 77.1 kg     Condition at discharge: stable  The results of significant diagnostics from this hospitalization (including imaging, microbiology, ancillary and laboratory) are listed below for reference.   Imaging Studies: ECHOCARDIOGRAM  COMPLETE  Result Date: 01/03/2022    ECHOCARDIOGRAM REPORT   Patient Name:   Alice Martinez [facility] Date of Exam: 01/03/2022 Medical Rec #:  [id number]       Height:       61.0 in Accession #:    [id number]      Weight:       170.0 lb Date of Birth:  Jan 16, 1976        BSA:          1.763 m Patient Age:    46 years        BP:           107/61 mmHg Patient Gender: F               HR:           74 bpm. Exam Location:  Inpatient Procedure: 2D Echo, Cardiac Doppler and Color Doppler Indications:    CHF-Acute Systolic I50.21  History:        Patient has no prior history of Echocardiogram examinations.                 Risk Factors:Hypertension.  Sonographer:    Eulah Pont RDCS Referring Phys:  [id number] IMPRESSIONS  1. Left ventricular ejection fraction, by estimation, is 60 to 65%. The left ventricle has normal function. The left ventricle has no regional wall motion abnormalities. Left ventricular diastolic parameters were normal.  2. Right ventricular systolic function is normal. The right ventricular size is normal. There is normal pulmonary artery systolic pressure. The estimated right ventricular systolic pressure is 21.8 mmHg.  3. The mitral valve is normal in structure. Trivial mitral valve regurgitation. No evidence of mitral stenosis.  4. The aortic valve is tricuspid. Aortic valve regurgitation is not visualized. No aortic stenosis is present.  5. The inferior vena cava is normal in size with greater than 50% respiratory variability, suggesting right atrial pressure of 3 mmHg. FINDINGS  Left Ventricle: Left ventricular ejection fraction, by estimation, is 60 to 65%. The left ventricle has normal function. The left ventricle has no regional wall motion abnormalities. The left ventricular internal cavity size was normal in size. There is  no left ventricular hypertrophy. Left ventricular diastolic parameters were normal. Right Ventricle: The right ventricular size is normal. No increase in right ventricular  wall thickness. Right ventricular systolic function is normal. There is normal pulmonary artery systolic pressure. The tricuspid regurgitant velocity is 2.17 m/s, and  with an assumed right atrial pressure of 3 mmHg, the estimated right ventricular systolic pressure is 21.8 mmHg. Left Atrium: Left atrial size was normal in size. Right Atrium: Right atrial size was normal in size. Pericardium: There is no evidence of pericardial effusion. Mitral Valve: The mitral valve is normal in structure. Trivial mitral valve regurgitation. No evidence of mitral valve stenosis. Tricuspid Valve: The tricuspid valve is normal in structure. Tricuspid valve regurgitation is trivial. No evidence of tricuspid stenosis. Aortic Valve: The aortic valve is tricuspid. Aortic valve regurgitation is not visualized. No aortic stenosis is present. Pulmonic Valve: The pulmonic valve was normal in structure. Pulmonic valve regurgitation is trivial. No evidence of pulmonic stenosis. Aorta: The aortic root is normal in size and structure. Venous: The inferior vena cava is normal in size with greater than 50% respiratory variability, suggesting right atrial pressure of 3 mmHg. IAS/Shunts: No atrial level shunt detected by color flow Doppler.  LEFT VENTRICLE PLAX 2D LVIDd:         4.40 cm     Diastology LVIDs:         2.90 cm     LV e' medial:    9.11 cm/s LV PW:         1.00 cm     LV E/e' medial:  10.9 LV IVS:        0.60 cm     LV e' lateral:   15.10 cm/s LVOT diam:     1.90 cm     LV E/e' lateral: 6.6 LV SV:         54 LV SV Index:   30 LVOT Area:     2.84 cm  LV Volumes (MOD) LV vol d, MOD A2C: 70.6 ml LV vol d, MOD A4C: 71.1 ml LV vol s, MOD A2C: 31.2 ml LV vol s, MOD A4C: 27.4 ml LV SV MOD A2C:     39.4 ml LV SV MOD A4C:     71.1 ml LV SV MOD BP:      43.3 ml RIGHT VENTRICLE RV S prime:     12.60 cm/s TAPSE (M-mode): 2.2 cm LEFT ATRIUM             Index  RIGHT ATRIUM           Index LA diam:        3.00 cm 1.70 cm/m   RA Area:      11.30 cm LA Vol (A2C):   41.5 ml 23.54 ml/m  RA Volume:   21.40 ml  12.14 ml/m LA Vol (A4C):   40.8 ml 23.15 ml/m LA Biplane Vol: 41.3 ml 23.43 ml/m  AORTIC VALVE LVOT Vmax:   86.40 cm/s LVOT Vmean:  58.600 cm/s LVOT VTI:    0.189 m  AORTA Ao Root diam: 2.90 cm Ao Asc diam:  2.90 cm MITRAL VALVE               TRICUSPID VALVE MV Area (PHT): 2.95 cm    TR Peak grad:   18.8 mmHg MV Decel Time: 257 msec    TR Vmax:        217.00 cm/s MV E velocity: 99.60 cm/s MV A velocity: 63.40 cm/s  SHUNTS MV E/A ratio:  1.57        Systemic VTI:  0.19 m                            Systemic Diam: 1.90 cm Weston Brass MD Electronically signed by Weston Brass MD Signature Date/Time: 01/03/2022/12:43:03 PM    Final    DG CHEST PORT 1 VIEW  Result Date: 01/02/2022 CLINICAL DATA:  Orthopnea, facial swelling, shortness of breath EXAM: PORTABLE CHEST 1 VIEW COMPARISON:  Radiograph 09/15/2020 FINDINGS: Unchanged cardiomediastinal silhouette. There are mild diffuse interstitial opacities and lower lung predominant alveolar opacities. No large pleural effusion. No pneumothorax. No acute osseous abnormality. IMPRESSION: Mild to moderate pulmonary edema. Electronically Signed   By: Caprice Renshaw M.D.   On: 01/02/2022 13:43   DG Cervical Spine 2 or 3 views  Result Date: 01/01/2022 CLINICAL DATA:  Paresthesia, facial swelling. EXAM: CERVICAL SPINE - 2-3 VIEW COMPARISON:  None Available. FINDINGS: There is no evidence of cervical spine fracture or prevertebral soft tissue swelling. Alignment is normal. No other significant bone abnormalities are identified. IMPRESSION: Negative cervical spine radiographs. Electronically Signed   By: Lupita Raider M.D.   On: 01/01/2022 14:36   CT Soft Tissue Neck W Contrast  Result Date: 01/01/2022 CLINICAL DATA:  46 year old female with right side neck pain and swelling. EXAM: CT NECK WITH CONTRAST TECHNIQUE: Multidetector CT imaging of the neck was performed using the standard protocol following  the bolus administration of intravenous contrast. RADIATION DOSE REDUCTION: This exam was performed according to the departmental dose-optimization program which includes automated exposure control, adjustment of the mA and/or kV according to patient size and/or use of iterative reconstruction technique. CONTRAST:  59mL OMNIPAQUE IOHEXOL 300 MG/ML  SOLN COMPARISON:  None Available. FINDINGS: Pharynx and larynx: Within normal limits. Negative parapharyngeal and retropharyngeal space. Salivary glands: Most of the sublingual space remains normal. However, both submandibular glands appear to be hyperenhancing and inflamed. There is mild ductal dilatation in the right gland (series 3, image 55). But no dilatation of the more anterior submandibular ducts. No sialolithiasis identified. Furthermore there is a 2 cm heterogeneously enhancing phlegmon tracking inferior to the body of the right mandible (series 5 image 77) with thickening of the platysma. And there is abnormal right anterior sublingual space soft tissue edema or subperiosteal fluid on image 86. See right dental findings below. Remainder of the right masticator space is normal. Left masticator in both parotid spaces  remain normal. Thyroid: Negative. Lymph nodes: Reactive appearing level 1 and right level 2 lymph nodes. No cystic or necrotic nodes. Lymph node size up to 7 mm short axis. Thickening of the platysma in the submental region greater on the right. Generalized subcutaneous soft tissue swelling, stranding. Vascular: Major vascular structures in the neck and at the skull base remain patent. Limited intracranial: Negative. Visualized orbits: Negative. Mastoids and visualized paranasal sinuses: Clear throughout. Skeleton: Right mandible posterior molar periapical lucency and medial cortical dehiscence of the mandible alveolar process (series 5, image 75 and series 4, image 42). Regional soft tissue inflammation and heterogeneity. No other acute dental  finding. No other No acute osseous abnormality identified. Upper chest: Negative. IMPRESSION: Odontogenic Cellulitis. Medial mandible dehiscence at the roots of the posterior right mandible molar with inflamed right sublingual and right > left submandibular spaces. Subperiosteal edema tracking along the medial mandible (series 5, image 86), and a 2 cm phlegmon tracking inferiorly from the body of the right mandible (series 5, image 77). But no well organized abscess at this time. Secondary inflammation of the submandibular glands. Reactive regional lymph nodes. Electronically Signed   By: Odessa Fleming M.D.   On: 01/01/2022 06:15    Microbiology: Results for orders placed or performed during the hospital encounter of 04/04/21  Resp Panel by RT-PCR (Flu A&B, Covid) Nasopharyngeal Swab     Status: None   Collection Time: 04/04/21  1:00 PM   Specimen: Nasopharyngeal Swab; Nasopharyngeal(NP) swabs in vial transport medium  Result Value Ref Range Status   SARS Coronavirus 2 by RT PCR NEGATIVE NEGATIVE Final    Comment: (NOTE) SARS-CoV-2 target nucleic acids are NOT DETECTED.  The SARS-CoV-2 RNA is generally detectable in upper respiratory specimens during the acute phase of infection. The lowest concentration of SARS-CoV-2 viral copies this assay can detect is 138 copies/mL. A negative result does not preclude SARS-Cov-2 infection and should not be used as the sole basis for treatment or other patient management decisions. A negative result may occur with  improper specimen collection/handling, submission of specimen other than nasopharyngeal swab, presence of viral mutation(s) within the areas targeted by this assay, and inadequate number of viral copies(<138 copies/mL). A negative result must be combined with clinical observations, patient history, and epidemiological information. The expected result is Negative.  Fact Sheet for Patients:  BloggerCourse.com  Fact Sheet for  Healthcare Providers:  SeriousBroker.it  This test is no t yet approved or cleared by the [place] FDA and  has been authorized for detection and/or diagnosis of SARS-CoV-2 by FDA under an Emergency Use Authorization (EUA). This EUA will remain  in effect (meaning this test can be used) for the duration of the COVID-19 declaration under Section 564(b)(1) of the Act, 21 U.S.C.section 360bbb-3(b)(1), unless the authorization is terminated  or revoked sooner.       Influenza A by PCR NEGATIVE NEGATIVE Final   Influenza B by PCR NEGATIVE NEGATIVE Final    Comment: (NOTE) The Xpert Xpress SARS-CoV-2/FLU/RSV plus assay is intended as an aid in the diagnosis of influenza from Nasopharyngeal swab specimens and should not be used as a sole basis for treatment. Nasal washings and aspirates are unacceptable for Xpert Xpress SARS-CoV-2/FLU/RSV testing.  Fact Sheet for Patients: BloggerCourse.com  Fact Sheet for Healthcare Providers: SeriousBroker.it  This test is not yet approved or cleared by the [place] FDA and has been authorized for detection and/or diagnosis of SARS-CoV-2 by FDA under an Emergency Use Authorization (EUA). This  EUA will remain in effect (meaning this test can be used) for the duration of the COVID-19 declaration under Section 564(b)(1) of the Act, 21 U.S.C. section 360bbb-3(b)(1), unless the authorization is terminated or revoked.  Performed at [facility], [address], [place], [place] [address]     Labs: CBC: No results for input(s): "WBC", "NEUTROABS", "HGB", "HCT", "MCV", "PLT" in the last 168 hours. Basic Metabolic Panel: No results for input(s): "NA", "K", "CL", "CO2", "GLUCOSE", "BUN", "CREATININE", "CALCIUM", "MG", "PHOS" in the last 168 hours. Liver Function Tests: No results for input(s): "AST", "ALT", "ALKPHOS", "BILITOT", "PROT", "ALBUMIN" in  the last 168 hours. CBG: No results for input(s): "GLUCAP" in the last 168 hours.  Discharge time spent: greater than 30 minutes.  Signed: Barnetta Chapel, MD [facility] 01/11/2022

## 2022-01-28 ENCOUNTER — Telehealth (HOSPITAL_COMMUNITY): Payer: Commercial Managed Care - HMO | Admitting: Student in an Organized Health Care Education/Training Program

## 2022-01-28 DIAGNOSIS — F3170 Bipolar disorder, currently in remission, most recent episode unspecified: Secondary | ICD-10-CM

## 2022-01-28 DIAGNOSIS — F4312 Post-traumatic stress disorder, chronic: Secondary | ICD-10-CM

## 2022-01-28 DIAGNOSIS — G4700 Insomnia, unspecified: Secondary | ICD-10-CM

## 2022-01-28 NOTE — Progress Notes (Addendum)
Virtual Visit via Video Note  I attempted to connect with Alice Martinez on 01/28/22 at  8:00 AM EDT by a video enabled telemedicine application, however, she did not attend.  She will be rescheduled for a later date.   Briant Cedar, MD

## 2022-01-31 ENCOUNTER — Other Ambulatory Visit: Payer: Self-pay

## 2022-01-31 ENCOUNTER — Encounter: Payer: Self-pay | Admitting: Internal Medicine

## 2022-01-31 ENCOUNTER — Ambulatory Visit: Payer: Commercial Managed Care - HMO | Attending: Internal Medicine | Admitting: Internal Medicine

## 2022-01-31 VITALS — BP 137/85 | HR 75 | Temp 98.3°F | Ht 61.0 in | Wt 166.6 lb

## 2022-01-31 DIAGNOSIS — R200 Anesthesia of skin: Secondary | ICD-10-CM

## 2022-01-31 DIAGNOSIS — Z8639 Personal history of other endocrine, nutritional and metabolic disease: Secondary | ICD-10-CM

## 2022-01-31 DIAGNOSIS — Z7689 Persons encountering health services in other specified circumstances: Secondary | ICD-10-CM

## 2022-01-31 DIAGNOSIS — E039 Hypothyroidism, unspecified: Secondary | ICD-10-CM

## 2022-01-31 DIAGNOSIS — R202 Paresthesia of skin: Secondary | ICD-10-CM | POA: Diagnosis not present

## 2022-01-31 DIAGNOSIS — Z9189 Other specified personal risk factors, not elsewhere classified: Secondary | ICD-10-CM

## 2022-01-31 DIAGNOSIS — F17210 Nicotine dependence, cigarettes, uncomplicated: Secondary | ICD-10-CM

## 2022-01-31 DIAGNOSIS — I10 Essential (primary) hypertension: Secondary | ICD-10-CM

## 2022-01-31 DIAGNOSIS — Z2821 Immunization not carried out because of patient refusal: Secondary | ICD-10-CM

## 2022-01-31 DIAGNOSIS — F17200 Nicotine dependence, unspecified, uncomplicated: Secondary | ICD-10-CM | POA: Insufficient documentation

## 2022-01-31 DIAGNOSIS — Z1159 Encounter for screening for other viral diseases: Secondary | ICD-10-CM

## 2022-01-31 MED ORDER — SPIRONOLACTONE 25 MG PO TABS
12.5000 mg | ORAL_TABLET | Freq: Every day | ORAL | 3 refills | Status: DC
  Filled 2022-01-31: qty 15, 30d supply, fill #0
  Filled 2022-02-27 – 2022-03-07 (×2): qty 15, 30d supply, fill #1
  Filled 2022-04-05: qty 15, 30d supply, fill #2

## 2022-01-31 MED ORDER — LEVOTHYROXINE SODIUM 25 MCG PO TABS
25.0000 ug | ORAL_TABLET | Freq: Every day | ORAL | 5 refills | Status: DC
  Filled 2022-01-31: qty 30, 30d supply, fill #0
  Filled 2022-02-27 – 2022-03-07 (×2): qty 30, 30d supply, fill #1
  Filled 2022-04-05 – 2022-06-10 (×7): qty 30, 30d supply, fill #2
  Filled 2022-07-08: qty 30, 30d supply, fill #3

## 2022-01-31 NOTE — Progress Notes (Signed)
Patient ID: DEFNE GERLING, female    DOB: Dec 20, 1975  MRN: [id number]  CC: Hospitalization Follow-up [facility] f/u. Pressure on feet, numb / tingling hands.Fatigue./No flu vax.)   Subjective: Syrena Burges is a 46 y.o. female who presents for new patient visit and hospital follow-up. 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  Previous PCP was through [place] [facility] Dr. Lorenza Evangelist.  Last seen about 3 to 4 years ago.  Patient was hospitalized 9/2-08/2021 with odontogenic cellulitis right mandible and submandibular space.  Case was discussed with ENT Dr. Redmond Baseman and dentist Dr. Link Snuffer.  No intervention recommended as no drainable abscess seen on CT.  Patient treated with IV antibiotics.  Hospital course was complicated by edema and paresthesia in the hands and feet. Work-up including urinalysis with mild protein urea but normal urine protein/creatinine ratio Echo normal heart function without significant valvular heart disease, chest x-ray revealed mild to moderate pulmonary edema, GFR greater than 60, BNP 117, TSH normal.  Patient was managed with diuretics.  Patient was discharged home with Augmentin antibiotic and furosemide 20 mg twice a day for 10 days.   Today: Cellulitis has resolved.  I went over the CT scan results of the soft tissue of the neck.  Did show some dehiscence at the root of the posterior right lower molar.  Patient states she never had any toothache.  She has not seen a dentist in a while.  Patient gives history of polysubstance abuse which she has been clean for the past 7 to 8 months.  Previously on methadone but this was recently changed.  Currently in a Suboxone program call [facility] and is followed by [facility] with diagnosis of bipolar affective disorder, GAD, MDD and PTSD.  Currently on Seroquel, Cymbalta, BuSpar and trazodone.  Reports she is doing  okay on medications.  Main complaint today is of numbness and tingling in the hands and feet.  Started as numbness and swelling in the hands and feet 2 weeks prior to recent hospitalization. Numbness in hands as been constant and worse with lying down.  Feeling of pressure when she tries to make a fist.  Numbness in feet better but she still has a feeling of pressure on the top and bottom of both feet.  Swelling has diminished.  Out of furosemide that was given at hospital discharge. -Started on Cymbalta 20 mg twice a day by her behavioral health specialist last month to see if it would help with the tingling and numbness in her extremities.  It does help with the tingling in the feet but not in the hands. -Endorses weakness of grip.  History of hypothyroidism.  On levothyroxine but out of it x2 weeks.  History of vitamin D deficiency.  Currently not on vitamin D supplement.  Blood pressure noted to be elevated today.  She gives history of hypertension and states she was on medication but was taken off of it during the time that she was abusing drugs because blood pressure was dropping low.  Does not check blood pressure.  She tries to limit salt in the foods.  Currently smoking half a pack of cigarettes a day.  She was at 1 to 1-1/2 packs/day.  Trying to quit.  Using nicotine patches that she purchased off [employer].  HM: Due for Pap smear.  Declines flu shot.  Agreeable to hepatitis C screening. Patient Active Problem List  Diagnosis Date Noted   Cellulitis 01/01/2022   Depression 01/01/2022   Paresthesia 01/01/2022   Hypocalcemia 01/01/2022   Hypothyroidism 01/01/2022   Polysubstance abuse (HCC) 06/07/2020   Major depressive disorder, recurrent severe without psychotic features (HCC) 06/06/2020   Syncope 04/16/2020   Rhabdomyolysis 04/16/2020   Cocaine abuse (HCC) 04/16/2020   Weakness of right lower extremity 04/16/2020   PTSD (post-traumatic stress disorder) 05/27/2016   Dysthymia  05/27/2016   GAD (generalized anxiety disorder) 01/15/2016   Major depressive disorder, recurrent episode, moderate (HCC) 01/15/2016   Essential hypertension 01/15/2016   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 30 tablet 1   DULoxetine (CYMBALTA) 20 MG capsule Take 1 capsule (20 mg total) by mouth 2 (two) times daily. 60 capsule 1   QUEtiapine (SEROQUEL) 200 MG tablet Take 1 tablet (200 mg total) by mouth at bedtime. 30 tablet 1   traZODone (DESYREL) 50 MG tablet Take 1 tablet (50 mg total) by mouth at bedtime. 30 tablet 1   Buprenorphine HCl-Naloxone HCl 8-2 MG FILM Place under the tongue daily.     furosemide (LASIX) 20 MG tablet Take 1 tablet (20 mg total) by mouth 2 (two) times daily as needed for up to 10 days. 20 tablet 0   No current facility-administered medications on file prior to visit.    No Known Allergies  Social History   Socioeconomic History   Marital status: Divorced    Spouse name: Not on file   Number of children: Not on file   Years of education: Not on file   Highest education level: Not on file  Occupational History   Not on file  Tobacco Use   Smoking status: Every Day    Packs/day: 1.00    Years: 25.00    Total pack years: 25.00    Types: Cigarettes   Smokeless tobacco: Never  Vaping Use   Vaping Use: Never used  Substance and Sexual Activity   Alcohol use: Not Currently   Drug use: No   Sexual activity: Yes    Birth control/protection: Surgical  Other Topics Concern   Not on file  Social History Narrative   Not on file   Social Determinants of Health   Financial Resource Strain: Not on file  Food Insecurity: Not on file  Transportation Needs: Not on file  Physical Activity: Not on file  Stress: Not on file  Social Connections: Not  on file  Intimate Partner Violence: Not on file    Family History  Family history unknown: Yes    Past Surgical History:  Procedure Laterality Date   TONSILLECTOMY     TUBAL LIGATION      ROS: Review of Systems Negative except as stated above  PHYSICAL EXAM: BP 137/85 (BP Location: Left Arm, Patient Position: Sitting, Cuff Size: Large)   Pulse 75   Temp 98.3 F (36.8 C) (Oral)   Ht 5\' 1"  (1.549 m)   Wt 166 lb 9.6 oz (75.6 kg)   LMP 12/20/2021   SpO2 100%   BMI 31.48 kg/m   Physical Exam  General appearance - alert, well appearing, middle-age Caucasian female and in no distress Mental status - normal mood, behavior, speech, dress, motor activity, and thought processes Eyes - pupils equal and reactive, extraocular eye  movements intact Mouth -oral mucosa is moist.  Last molar in the lower jaw on both sides questionably with cavities Neck - supple, no significant adenopathy Chest - clear to auscultation, no wheezes, rales or rhonchi, symmetric air entry Heart - normal rate, regular rhythm, normal S1, S2, no murmurs, rubs, clicks or gallops Neurological -hands: No muscle wasting noted in the hands.  Grip 4+/5 bilaterally.  Gross sensation intact in both upper extremities and hands.  Gross sensation intact in both lower extremities and feet.  Power in the upper and lower extremities 5/5 bilaterally proximally and distally.  Pulses in the upper extremities and feet are 3+ bilaterally.  Gait appears stable. Extremities - peripheral pulses normal, trace edema around ankles from socks mark.      Latest Ref Rng & Units 01/03/2022    1:00 AM 01/02/2022   12:52 AM 01/01/2022    4:45 AM  CMP  Glucose 70 - 99 mg/dL 94  95  93   BUN 6 - 20 mg/dL 9  7  6    Creatinine 0.44 - 1.00 mg/dL  6.28  3.15   Sodium 135 - 145 mmol/L 140  136  139   Potassium 3.5 - 5.1 mmol/L 3.6  3.5  3.5   Chloride 98 - 111 mmol/L 105  108  104   CO2 22 - 32 mmol/L 27  25  28    Calcium 8.9 - 10.3 mg/dL 8.5   8.2  8.5   Total Protein 6.5 - 8.1 g/dL   5.6   Total Bilirubin 0.3 - 1.2 mg/dL   0.6   Alkaline Phos 38 - 126 U/L   52   AST 15 - 41 U/L   22   ALT 0 - 44 U/L   15    Lipid Panel     Component Value Date/Time   CHOL 278 (H) 06/07/2020 0728   TRIG 214 (H) 06/07/2020 0728   HDL 61 06/07/2020 0728   CHOLHDL 4.6 06/07/2020 0728   VLDL 43 (H) 06/07/2020 0728   LDLCALC 174 (H) 06/07/2020 0728    CBC    Component Value Date/Time   WBC 7.4 01/01/2022 0445   RBC 4.38 01/01/2022 0445   HGB 13.1 01/01/2022 0445   HCT 38.6 01/01/2022 0445   PLT 267 01/01/2022 0445   MCV 88.1 01/01/2022 0445   MCH 29.9 01/01/2022 0445   MCHC 33.9 01/01/2022 0445   RDW 13.1 01/01/2022 0445   LYMPHSABS 1.6 01/01/2022 0445   MONOABS 0.6 01/01/2022 0445   EOSABS 0.3 01/01/2022 0445   BASOSABS 0.0 01/01/2022 0445    ASSESSMENT AND PLAN: 1. Establishing care with new doctor, encounter for   2. Numbness and tingling of both feet Questionable etiology.  Thyroid level while in hospital was normal but she has been off levothyroxine for 2 weeks.  Refill sent.  We will plan to recheck a level on subsequent visit.  We will also check vitamin B12 level to rule out B12 deficiency. - Ambulatory referral to Neurology - Vitamin B12  3. Numbness and tingling in both hands See #2 above. - Ambulatory referral to Neurology - Vitamin B12  4. Hypothyroidism (acquired) - levothyroxine (SYNTHROID) 25 MCG tablet; Take 1 tablet (25 mcg total) by mouth daily before breakfast.  Dispense: 30 tablet; Refill: 5  5. Essential hypertension Not at goal.  DASH diet discussed.  We will start her on low-dose of spironolactone.  I note that she has history of potassium level riding a  bit low. - Basic Metabolic Panel - spironolactone (ALDACTONE) 25 MG tablet; Take 0.5 tablets (12.5 mg total) by mouth daily.  Dispense: 30 tablet; Refill: 3  6. Tobacco dependence Commended her on cutting back and trying to quit.  She will  continue to use the nicotine patches as she tries to quit cigarettes.  7. History of vitamin D deficiency - VITAMIN D 25 Hydroxy (Vit-D Deficiency, Fractures)  8. Need for dental care Recommend that she makes an appointment to see a dentist  9. Influenza vaccination declined   10. Need for hepatitis C screening test - Hepatitis C Antibody    Patient was given the opportunity to ask questions.  Patient verbalized understanding of the plan and was able to repeat key elements of the plan.   This documentation was completed using Paediatric nurse.  Any transcriptional errors are unintentional.  Orders Placed This Encounter  Procedures   Vitamin B12   VITAMIN D 25 Hydroxy (Vit-D Deficiency, Fractures)   Basic Metabolic Panel   Hepatitis C Antibody   Ambulatory referral to Neurology     Requested Prescriptions   Signed Prescriptions Disp Refills   spironolactone (ALDACTONE) 25 MG tablet 30 tablet 3    Sig: Take 0.5 tablets (12.5 mg total) by mouth daily.   levothyroxine (SYNTHROID) 25 MCG tablet 30 tablet 5    Sig: Take 1 tablet (25 mcg total) by mouth daily before breakfast.    Return in about 6 weeks (around 03/14/2022) for PAP.  Jonah Blue, MD, FACP

## 2022-01-31 NOTE — Patient Instructions (Addendum)
I have sent a prescription to the pharmacy for blood pressure medication called spironolactone.  This medication will help to decrease swelling in the extremities.  Please stop at the laboratory today to have your blood test done as was discussed today.  I have sent refill on the levothyroxine.

## 2022-02-02 ENCOUNTER — Other Ambulatory Visit: Payer: Self-pay

## 2022-02-02 LAB — HEPATITIS C ANTIBODY: Hep C Virus Ab: NONREACTIVE

## 2022-02-02 LAB — BASIC METABOLIC PANEL
BUN/Creatinine Ratio: 15 (ref 9–23)
BUN: 11 mg/dL (ref 6–24)
CO2: 21 mmol/L (ref 20–29)
Calcium: 9.1 mg/dL (ref 8.7–10.2)
Chloride: 103 mmol/L (ref 96–106)
Creatinine, Ser: 0.74 mg/dL (ref 0.57–1.00)
Glucose: 98 mg/dL (ref 70–99)
Potassium: 4.5 mmol/L (ref 3.5–5.2)
Sodium: 139 mmol/L (ref 134–144)
eGFR: 102 mL/min/{1.73_m2} (ref >59)

## 2022-02-02 LAB — VITAMIN B12: Vitamin B-12: 424 pg/mL (ref 232–1245)

## 2022-02-02 LAB — VITAMIN D 25 HYDROXY (VIT D DEFICIENCY, FRACTURES): Vit D, 25-Hydroxy: 29.5 ng/mL — ABNORMAL LOW (ref 30.0–100.0)

## 2022-02-04 ENCOUNTER — Ambulatory Visit: Payer: Self-pay

## 2022-02-04 ENCOUNTER — Encounter (HOSPITAL_BASED_OUTPATIENT_CLINIC_OR_DEPARTMENT_OTHER): Payer: Self-pay

## 2022-02-04 ENCOUNTER — Emergency Department (HOSPITAL_BASED_OUTPATIENT_CLINIC_OR_DEPARTMENT_OTHER): Payer: Commercial Managed Care - HMO

## 2022-02-04 ENCOUNTER — Emergency Department (HOSPITAL_BASED_OUTPATIENT_CLINIC_OR_DEPARTMENT_OTHER)
Admission: EM | Admit: 2022-02-04 | Discharge: 2022-02-04 | Disposition: A | Discharge status: Home / Self Care | Payer: Commercial Managed Care - HMO | Attending: Emergency Medicine | Admitting: Emergency Medicine

## 2022-02-04 ENCOUNTER — Other Ambulatory Visit: Payer: Self-pay

## 2022-02-04 ENCOUNTER — Encounter (HOSPITAL_COMMUNITY): Payer: No Payment, Other | Admitting: Student in an Organized Health Care Education/Training Program

## 2022-02-04 DIAGNOSIS — R5383 Other fatigue: Secondary | ICD-10-CM | POA: Insufficient documentation

## 2022-02-04 DIAGNOSIS — R600 Localized edema: Secondary | ICD-10-CM | POA: Insufficient documentation

## 2022-02-04 LAB — CBC WITH DIFFERENTIAL/PLATELET
Abs Immature Granulocytes: 0.01 10*3/uL (ref 0.00–0.07)
Basophils Absolute: 0 10*3/uL (ref 0.0–0.1)
Basophils Relative: 1 %
Eosinophils Absolute: 0.3 10*3/uL (ref 0.0–0.5)
Eosinophils Relative: 5 %
HCT: 37.6 % (ref 36.0–46.0)
Hemoglobin: 12.5 g/dL (ref 12.0–15.0)
Immature Granulocytes: 0 %
Lymphocytes Relative: 32 %
Lymphs Abs: 2.1 10*3/uL (ref 0.7–4.0)
MCH: 29.3 pg (ref 26.0–34.0)
MCHC: 33.2 g/dL (ref 30.0–36.0)
MCV: 88.3 fL (ref 80.0–100.0)
Monocytes Absolute: 0.6 10*3/uL (ref 0.1–1.0)
Monocytes Relative: 9 %
Neutro Abs: 3.5 10*3/uL (ref 1.7–7.7)
Neutrophils Relative %: 53 %
Platelets: 265 10*3/uL (ref 150–400)
RBC: 4.26 MIL/uL (ref 3.87–5.11)
RDW: 12.7 % (ref 11.5–15.5)
WBC: 6.6 10*3/uL (ref 4.0–10.5)
nRBC: 0 % (ref 0.0–0.2)

## 2022-02-04 LAB — COMPREHENSIVE METABOLIC PANEL
ALT: 9 U/L (ref 0–44)
AST: 16 U/L (ref 15–41)
Albumin: 4 g/dL (ref 3.5–5.0)
Alkaline Phosphatase: 45 U/L (ref 38–126)
Anion gap: 8 (ref 5–15)
BUN: 20 mg/dL (ref 6–20)
CO2: 25 mmol/L (ref 22–32)
Calcium: 8.8 mg/dL — ABNORMAL LOW (ref 8.9–10.3)
Chloride: 104 mmol/L (ref 98–111)
Creatinine, Ser: 1.09 mg/dL — ABNORMAL HIGH (ref 0.44–1.00)
GFR, Estimated: >60 mL/min (ref >60)
Glucose, Bld: 85 mg/dL (ref 70–99)
Potassium: 3.8 mmol/L (ref 3.5–5.1)
Sodium: 137 mmol/L (ref 135–145)
Total Bilirubin: 0.3 mg/dL (ref 0.3–1.2)
Total Protein: 6 g/dL — ABNORMAL LOW (ref 6.5–8.1)

## 2022-02-04 LAB — BRAIN NATRIURETIC PEPTIDE: B Natriuretic Peptide: 36.4 pg/mL (ref 0.0–100.0)

## 2022-02-04 MED ORDER — FUROSEMIDE 20 MG PO TABS
20.0000 mg | ORAL_TABLET | Freq: Every day | ORAL | 0 refills | Status: DC | PRN
Start: 2022-02-04 — End: 2023-05-04

## 2022-02-04 NOTE — ED Triage Notes (Signed)
Patient here POV from Home.  Endorses Generalized Swelling and pain to Bilateral Upper and Lower Extremities that has been present today. States this has happened before in the Past and was admitted and discharged for Same approximately 1 Month ago.   NAD Noted during Triage. A&Ox4. GCS 15. Ambulatory.

## 2022-02-04 NOTE — ED Provider Notes (Signed)
[facility] EMERGENCY DEPT Provider Note   CSN: [id number] Arrival date & time: 02/04/22  1744     History Chief Complaint  Patient presents with   Edema    HPI Alice Martinez is a 46 y.o. female presenting for chief complaint of diffuse musculoskeletal pain.  She endorses aches and tingling since of her bilateral hands and feet.  She states is all been present over the last month.  She was seen by her PCP for similar earlier this week and outpatient lab work-up was grossly nondiagnostic so patient was referred to neurology which has been arranged in outpatient setting.  She was called in a diuretic which the patient has not started.  History of possible heart failure though echo was reassuring.  Patient was to be trialed on diuretic in the outpatient setting.  Swelling is diffuse among all 4 extremities and not worse in either individual extremity.  Patient's recorded medical, surgical, social, medication list and allergies were reviewed in the Snapshot window as part of the initial history.   Review of Systems   Review of Systems  Constitutional:  Negative for chills and fever.  HENT:  Negative for ear pain and sore throat.   Eyes:  Negative for pain and visual disturbance.  Respiratory:  Negative for cough and shortness of breath.   Cardiovascular:  Positive for leg swelling. Negative for chest pain and palpitations.  Gastrointestinal:  Negative for abdominal pain and vomiting.  Genitourinary:  Negative for dysuria and hematuria.  Musculoskeletal:  Positive for arthralgias and myalgias. Negative for back pain.  Skin:  Negative for color change and rash.  Neurological:  Negative for seizures and syncope.  All other systems reviewed and are negative.   Physical Exam Updated Vital Signs BP 129/75   Pulse 93   Temp 98.2 F (36.8 C)   Resp 17   Ht 5\' 1"  (1.549 m)   Wt 75.6 kg   SpO2 99%   BMI 31.49 kg/m  Physical Exam Vitals and nursing note reviewed.   Constitutional:      General: She is not in acute distress.    Appearance: She is well-developed.  HENT:     Head: Normocephalic and atraumatic.  Eyes:     Conjunctiva/sclera: Conjunctivae normal.  Cardiovascular:     Rate and Rhythm: Normal rate and regular rhythm.     Heart sounds: No murmur heard. Pulmonary:     Effort: Pulmonary effort is normal. No respiratory distress.     Breath sounds: Normal breath sounds.  Abdominal:     Palpations: Abdomen is soft.     Tenderness: There is no abdominal tenderness.  Musculoskeletal:        General: No swelling or deformity.     Cervical back: Neck supple.     Right lower leg: Edema (1+) present.     Left lower leg: Edema (1+) present.  Skin:    General: Skin is warm and dry.     Capillary Refill: Capillary refill takes less than 2 seconds.  Neurological:     Mental Status: She is alert.  Psychiatric:        Mood and Affect: Mood normal.      ED Course/ Medical Decision Making/ A&P    Procedures Procedures   Medications Ordered in ED Medications - No data to display  Medical Decision Making:    Alice Martinez is a 46 y.o. female who presented to the ED today with multiple complaints.  She endorses  fatigue, lightheadedness, poor p.o. intake over the past few weeks.  No known sick contacts patient otherwise ambulatory tolerating p.o. intake detailed above.     Patient's presentation is complicated by their history of complex recent.  Patient placed on continuous vitals and telemetry monitoring while in ED which was reviewed periodically.   Complete initial physical exam performed, notably the patient  was hemodynamically stable in no acute distress.      Reviewed and confirmed nursing documentation for past medical history, family history, social history.    Initial Assessment:   Patient's history present on his physical exam findings raise concern for ongoing fatigue.  Considered ongoing infection, metabolic  disturbance, endocrinologic disturbance, ID seem grossly less likely.  Notably, patient is already evaluated with a primary care provider earlier this week and has outpatient care plan that has recently been started.  She has not picked up any of the medications yet.  I do not feel that further interventions in the emergency department would be beneficial.  However will evaluate patient for critical abnormality with screening labs include CBC, CMP, BNP.  All of which resulted with no acute pathology.  Patient observed in emergency department for 5 hours with no further worsening of her symptoms.  At the time of reassessment, she is ambulatory tolerating p.o. intake with only mild hand cramps remaining.  Unknown uncertain etiology of the symptoms no acute indication for emergent intervention.  Patient discharged with plan for close follow-up with PCP within 48 hours.  Patient discharged with no further acute events.  Clinical Impression:  1. Other fatigue      Discharge   Final Clinical Impression(s) / ED Diagnoses Final diagnoses:  Other fatigue    Rx / DC Orders ED Discharge Orders     None         Tretha Sciara, MD 02/04/22 2300

## 2022-02-04 NOTE — Telephone Encounter (Signed)
Spoke w/ pt and she confirmed preferred pharmacy is [facility] [address] , [place], [place] [id number].

## 2022-02-04 NOTE — Telephone Encounter (Signed)
Summary: asking for prescription   Pt called saying she was recently in the ED and she saw Karle Plumber this week.  Pt says she is swelling again like she was when she went to the ED  she is asking if she can get the Lasix that she was taking in the ED.   [facility] [address] [place]  [phone]     Chief Complaint: right foot and ankle swelling > left foot and ankle, right hand swelling Symptoms: bilateral calf cramping and aching, right foot and ankle more swollen than left, tingling and swelling in both hands Right > left, "bone pain" Frequency: yesterday Pertinent Negatives: Patient denies SOB, fever, chest pain, redness to swollen feet or ankles. Disposition: [x] ED /[] Urgent Care (no appt availability in office) / [] Appointment(In office/virtual)/ []  [facility]/ [] Home Care/ [] Refused Recommended Disposition /[]  Mobile Bus/ []  Follow-up with PCP Additional Notes: n/a  Reason for Disposition  [1] Thigh, calf, or ankle swelling AND [2] bilateral AND [3] 1 side is more swollen  Swelling of face, arm or hands  (Exception: Slight puffiness of fingers occurring during hot weather.)    Right hand  Answer Assessment - Initial Assessment Questions 1. ONSET: "When did the swelling start?" (e.g., minutes, hours, days)     yesterday 2. LOCATION: "What part of the leg is swollen?"  "Are both legs swollen or just one leg?"     Right ankle and foot worse than left foot 3. SEVERITY: "How bad is the swelling?" (e.g., localized; mild, moderate, severe)   - Localized: Small area of swelling localized to one leg.   - MILD pedal edema: Swelling limited to foot and ankle, pitting edema < 1/4 inch (6 mm) deep, rest and elevation eliminate most or all swelling.   - MODERATE edema: Swelling of lower leg to knee, pitting edema > 1/4 inch (6 mm) deep, rest and elevation only partially reduce swelling.   - SEVERE edema: Swelling extends above knee, facial or hand  swelling present.      mild 4. REDNESS: "Does the swelling look red or infected?"     no 5. PAIN: "Is the swelling painful to touch?" If Yes, ask: "How painful is it?"   (Scale 1-10; mild, moderate or severe)   Yes 5/10 and bilateral calf cramping and pain 6. FEVER: "Do you have a fever?" If Yes, ask: "What is it, how was it measured, and when did it start?"      no 7. CAUSE: "What do you think is causing the leg swelling?"     unsure 8. MEDICAL HISTORY: "Do you have a history of blood clots (e.g., DVT), cancer, heart failure, kidney disease, or liver failure?"     no 9. RECURRENT SYMPTOM: "Have you had leg swelling before?" If Yes, ask: "When was the last time?" "What happened that time?"     Yes Sept- hospitalized then IV Lasix 10. OTHER SYMPTOMS: "Do you have any other symptoms?" (e.g., chest pain, difficulty breathing)       Tingling and swelling both hands  (right > left) and feet, bone pain 11. PREGNANCY: "Is there any chance you are pregnant?" "When was your last menstrual period?"       N/a  Protocols used: Leg Swelling and Edema-A-AH

## 2022-02-04 NOTE — Addendum Note (Signed)
Addended by: Karle Plumber B on: 02/04/2022 09:09 PM   Modules accepted: Orders

## 2022-02-05 ENCOUNTER — Other Ambulatory Visit: Payer: Self-pay | Admitting: Internal Medicine

## 2022-02-27 ENCOUNTER — Other Ambulatory Visit (HOSPITAL_COMMUNITY): Payer: Self-pay | Admitting: Student in an Organized Health Care Education/Training Program

## 2022-02-27 DIAGNOSIS — F411 Generalized anxiety disorder: Secondary | ICD-10-CM

## 2022-02-27 DIAGNOSIS — F3170 Bipolar disorder, currently in remission, most recent episode unspecified: Secondary | ICD-10-CM

## 2022-02-27 DIAGNOSIS — G4700 Insomnia, unspecified: Secondary | ICD-10-CM

## 2022-02-27 DIAGNOSIS — F4312 Post-traumatic stress disorder, chronic: Secondary | ICD-10-CM

## 2022-02-28 ENCOUNTER — Other Ambulatory Visit: Payer: Self-pay

## 2022-02-28 MED ORDER — QUETIAPINE FUMARATE 200 MG PO TABS
200.0000 mg | ORAL_TABLET | Freq: Every day | ORAL | 1 refills | Status: DC
  Filled 2022-02-28 – 2022-03-07 (×2): qty 30, 30d supply, fill #0

## 2022-02-28 MED ORDER — TRAZODONE HCL 50 MG PO TABS
50.0000 mg | ORAL_TABLET | Freq: Every day | ORAL | 1 refills | Status: DC
  Filled 2022-02-28 – 2022-03-07 (×2): qty 30, 30d supply, fill #0

## 2022-02-28 NOTE — Telephone Encounter (Signed)
Received request for refills of patients Seroquel and Trazodone.  These were sent.   Sent: -Seroquel 200 mg QHS.  30 tablets with 1 refill. -Trazodone 50 mg QHS.  30 tablets with 1 refill.      Fatima Sanger MD Resident

## 2022-03-04 ENCOUNTER — Other Ambulatory Visit: Payer: Self-pay

## 2022-03-07 ENCOUNTER — Other Ambulatory Visit: Payer: Self-pay

## 2022-03-10 ENCOUNTER — Telehealth: Payer: Self-pay | Admitting: Diagnostic Neuroimaging

## 2022-03-10 ENCOUNTER — Other Ambulatory Visit: Payer: Self-pay

## 2022-03-10 ENCOUNTER — Ambulatory Visit: Payer: Commercial Managed Care - HMO | Admitting: Diagnostic Neuroimaging

## 2022-03-10 ENCOUNTER — Encounter: Payer: Self-pay | Admitting: Diagnostic Neuroimaging

## 2022-03-10 VITALS — BP 148/94 | HR 91 | Ht 61.0 in | Wt 162.0 lb

## 2022-03-10 DIAGNOSIS — M62838 Other muscle spasm: Secondary | ICD-10-CM

## 2022-03-10 DIAGNOSIS — R200 Anesthesia of skin: Secondary | ICD-10-CM

## 2022-03-10 NOTE — Telephone Encounter (Signed)
[employer] sent to [facility] they obtain auth [id number] 

## 2022-03-10 NOTE — Progress Notes (Signed)
[facility]  PATIENT: Alice Martinez DOB: [birth year]  REFERRING CLINICIAN: Ladell Pier, MD HISTORY FROM: patient REASON FOR VISIT: new consult   HISTORICAL  CHIEF COMPLAINT:  Chief Complaint  Patient presents with   New Patient (Initial Visit)    Rm 6, pt alone, pt states that she has noted hands and feet bilaterally. She states that this started about 8 mths ago (which is when she went into recovery) she states that this interrupts her sleep because the sensation is uncomfortable. She states that it is more prominent in the morning and when she lays down. States it is hard for her to get up and get going. She is unable to ball her right fist. Difficulty with walking especially in the am. No imaging completed.     HISTORY OF PRESENT ILLNESS:   46 year old female here for evaluation of intermittent numbness and tingling in the right side.  Symptoms started few months ago.  Has numbness and tingling in the hand arm and foot.  Symptoms more notable when she lays down.  Symptoms relieved by standing and walking around.  Also having some stiffness in her bilateral hands difficulty falling her fingers up into fist.  Also has a stuck feeling in her right third digit, sounds like a trigger finger.   REVIEW OF SYSTEMS: Full 14 system review of systems performed and negative with exception of: as per HPI.  ALLERGIES: No Known Allergies  HOME MEDICATIONS: Outpatient Medications Prior to Visit  Medication Sig Dispense Refill   Buprenorphine HCl-Naloxone HCl 8-2 MG FILM Place under the tongue daily.     busPIRone (BUSPAR) 5 MG tablet Take 1 tablet (5 mg total) by mouth at bedtime. 30 tablet 1   DULoxetine (CYMBALTA) 20 MG capsule Take 1 capsule (20 mg total) by mouth 2 (two) times daily. 60 capsule 1   furosemide (LASIX) 20 MG tablet Take 1 tablet (20 mg total) by mouth daily as needed. 10 tablet 0   levothyroxine (SYNTHROID) 25 MCG tablet Take 1 tablet (25 mcg  total) by mouth daily before breakfast. 30 tablet 5   QUEtiapine (SEROQUEL) 200 MG tablet Take 1 tablet (200 mg total) by mouth at bedtime. 30 tablet 1   spironolactone (ALDACTONE) 25 MG tablet Take 0.5 tablets (12.5 mg total) by mouth daily. 30 tablet 3   traZODone (DESYREL) 50 MG tablet Take 1 tablet (50 mg total) by mouth at bedtime. 30 tablet 1   No facility-administered medications prior to visit.    PAST MEDICAL HISTORY: Past Medical History:  Diagnosis Date   ADHD (attention deficit hyperactivity disorder)    Depression    High cholesterol    Hypertension    Thyroid disease     PAST SURGICAL HISTORY: Past Surgical History:  Procedure Laterality Date   TONSILLECTOMY     TUBAL LIGATION      FAMILY HISTORY: Family History  Problem Relation Age of Onset   Hypertension Mother    Hypertension Father    Diabetes Father    Heart disease Father     SOCIAL HISTORY: Social History   Socioeconomic History   Marital status: Divorced    Spouse name: Not on file   Number of children: Not on file   Years of education: Not on file   Highest education level: Not on file  Occupational History   Not on file  Tobacco Use   Smoking status: Every Day    Packs/day: 0.50    Years: 25.00  Total pack years: 12.50    Types: Cigarettes   Smokeless tobacco: Never  Vaping Use   Vaping Use: Never used  Substance and Sexual Activity   Alcohol use: Not Currently   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   Sexual activity: Yes    Birth control/protection: Surgical  Other Topics Concern   Not on file  Social History Narrative   Not on file   Social Determinants of Health   Financial Resource Strain: Not on file  Food Insecurity: Not on file  Transportation Needs: Not on file  Physical Activity: Not on file  Stress: Not on file  Social Connections: Not on file  Intimate Partner Violence: Not on file     PHYSICAL EXAM  GENERAL EXAM/CONSTITUTIONAL: Vitals:  Vitals:    03/10/22 1442  BP: (!) 148/94  Pulse: 91  Weight: 162 lb (73.5 kg)  Height: _0  (1.549 m)   Body mass index is 30.61 kg/m. Wt Readings from Last 3 Encounters:  03/10/22 162 lb (73.5 kg)  02/04/22 166 lb 10.7 oz (75.6 kg)  01/31/22 166 lb 9.6 oz (75.6 kg)   Patient is in no distress; well developed, nourished and groomed; neck is supple  CARDIOVASCULAR: Examination of carotid arteries is normal; no carotid bruits Regular rate and rhythm, no murmurs Examination of peripheral vascular system by observation and palpation is normal  EYES: Ophthalmoscopic exam of optic discs and posterior segments is normal; no papilledema or hemorrhages No results found.  MUSCULOSKELETAL: Gait, strength, tone, movements noted in Neurologic exam below  NEUROLOGIC: MENTAL STATUS:      No data to display         awake, alert, oriented to person, place and time recent and remote memory intact normal attention and concentration language fluent, comprehension intact, naming intact fund of knowledge appropriate  CRANIAL NERVE:  2nd - no papilledema on fundoscopic exam 2nd, 3rd, 4th, 6th - pupils equal and reactive to light, visual fields full to confrontation, extraocular muscles intact, no nystagmus 5th - facial sensation symmetric 7th - facial strength symmetric 8th - hearing intact 9th - palate elevates symmetrically, uvula midline 11th - shoulder shrug symmetric 12th - tongue protrusion midline  MOTOR:  normal bulk and tone, full strength in the BUE, BLE  SENSORY:  normal and symmetric to light touch, temperature, vibration  COORDINATION:  finger-nose-finger, fine finger movements normal  REFLEXES:  deep tendon reflexes present and symmetric  GAIT/STATION:  narrow based gait    DIAGNOSTIC DATA (LABS, IMAGING, TESTING) - I reviewed patient records, labs, notes, testing and imaging myself where available.  Lab Results  Component Value Date   WBC 6.6 02/04/2022    HGB 12.5 02/04/2022   HCT 37.6 02/04/2022   MCV 88.3 02/04/2022   PLT 265 02/04/2022      Component Value Date/Time   NA 137 02/04/2022 1957   NA 139 01/31/2022 1119   K 3.8 02/04/2022 1957   CL 104 02/04/2022 1957   CO2 25 02/04/2022 1957   GLUCOSE 85 02/04/2022 1957   BUN 20 02/04/2022 1957   BUN 11 01/31/2022 1119   CREATININE 1.09 (H) 02/04/2022 1957   CALCIUM 8.8 (L) 02/04/2022 1957   PROT 6.0 (L) 02/04/2022 1957   ALBUMIN 4.0 02/04/2022 1957   AST 16 02/04/2022 1957   ALT 9 02/04/2022 1957   ALKPHOS 45 02/04/2022 1957   BILITOT 0.3 02/04/2022 1957   GFRNONAA >60 02/04/2022 1957   GFRAA >60 03/10/2019  1537   Lab Results  Component Value Date   CHOL 278 (H) 06/07/2020   HDL 61 06/07/2020   LDLCALC 174 (H) 06/07/2020   TRIG 214 (H) 06/07/2020   CHOLHDL 4.6 06/07/2020   Lab Results  Component Value Date   HGBA1C 5.7 (H) 06/07/2020   Lab Results  Component Value Date   RKSYSDBN34 483 01/31/2022   Lab Results  Component Value Date   TSH 0.862 01/01/2022    04/04/2021 CT head - No acute intracranial abnormality.     ASSESSMENT AND PLAN  46 y.o. year old female here with:   Dx:  1. Numbness   2. Muscle spasm      PLAN:  INTERMITTENT NUMBNESS, SPASM, PAIN (arms, legs; right face) - check neuropathy labs - check MRI brain, cervical spine (demyelinating disease workup; rule out MS)  Orders Placed This Encounter  Procedures   MR CERVICAL SPINE W WO CONTRAST   MR BRAIN W WO CONTRAST   Hemoglobin A1c   ANA,IFA RA Diag Pnl w/rflx Tit/Patn   Multiple Myeloma Panel (SPEP&IFE w/QIG)   Return for pending if symptoms worsen or fail to improve, pending test results.    Penni Bombard, MD 05/06/9966, 9:57 PM Certified in Neurology, Neurophysiology and Neuroimaging  [facility] [address], [place], [place] [address] [phone]

## 2022-03-14 ENCOUNTER — Other Ambulatory Visit (HOSPITAL_COMMUNITY)
Admission: RE | Admit: 2022-03-14 | Discharge: 2022-03-14 | Disposition: A | Discharge status: Home / Self Care | Payer: Commercial Managed Care - HMO | Source: Ambulatory Visit | Attending: Internal Medicine | Admitting: Internal Medicine

## 2022-03-14 ENCOUNTER — Ambulatory Visit: Payer: Commercial Managed Care - HMO | Attending: Internal Medicine | Admitting: Internal Medicine

## 2022-03-14 VITALS — BP 145/88 | HR 82 | Temp 98.1°F | Ht 61.0 in | Wt 160.0 lb

## 2022-03-14 DIAGNOSIS — F17210 Nicotine dependence, cigarettes, uncomplicated: Secondary | ICD-10-CM | POA: Diagnosis not present

## 2022-03-14 DIAGNOSIS — Z124 Encounter for screening for malignant neoplasm of cervix: Secondary | ICD-10-CM | POA: Diagnosis present

## 2022-03-14 DIAGNOSIS — Z1231 Encounter for screening mammogram for malignant neoplasm of breast: Secondary | ICD-10-CM

## 2022-03-14 DIAGNOSIS — I10 Essential (primary) hypertension: Secondary | ICD-10-CM | POA: Diagnosis not present

## 2022-03-14 NOTE — Progress Notes (Signed)
Patient ID: Alice Martinez, female    DOB: 09/26/1975  MRN: [id number]  CC: Gynecologic Exam (No questions / concerns. /No to flu vax.)   Subjective: Alice Martinez is a 46 y.o. 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   degenerative joint diseaseGYN History:  Pt is G3P2 (1 aborted) Any hx of abn paps?: no Menses regular or irregular?: regular for past 6 mths How long does menses last?  7 days Menstrual flow light or heavy?: heavy first several days Method of birth control?:  tubal ligation Any vaginal dischg at this time?:  no Dysuria?: no Any hx of STI?: no Sexually active with how many partners: currently not active for past 8-9 mths Desires STI screen: no. Last MMG:  Family hx of uterine, cervical or breast cancer?:  no   BP elev today.  Thinks due to dental pain.  Saw dentist today ad has dental infection.  Will pick up abx that was prescribed. Taking Spironolactone as prescribed. Patient Active Problem List   Diagnosis Date Noted   Tobacco dependence 01/31/2022   History of vitamin D deficiency 01/31/2022   Cellulitis 01/01/2022   Depression 01/01/2022   Paresthesia 01/01/2022   Hypocalcemia 01/01/2022   Hypothyroidism 01/01/2022   Polysubstance abuse ([place]) 06/07/2020   Major depressive disorder, recurrent severe without psychotic features ([place]) 06/06/2020   Syncope 04/16/2020   Rhabdomyolysis 04/16/2020   Cocaine abuse ([place]) 04/16/2020   Weakness of right lower extremity 04/16/2020   PTSD (post-traumatic stress disorder) 05/27/2016   Dysthymia 05/27/2016   GAD (generalized anxiety disorder) 01/15/2016   Major depressive disorder, recurrent episode, moderate ([place]) 01/15/2016   Essential hypertension 01/15/2016   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     busPIRone (BUSPAR) 5 MG tablet Take 1 tablet (5 mg total) by mouth at bedtime. 30 tablet 1   levothyroxine (SYNTHROID) 25 MCG tablet Take 1 tablet (25 mcg total) by mouth daily before breakfast. 30 tablet 5   QUEtiapine (SEROQUEL) 200 MG tablet Take 1 tablet (200 mg total) by mouth at bedtime. 30 tablet 1   spironolactone (ALDACTONE) 25 MG tablet Take 0.5 tablets (12.5 mg total) by mouth daily. 30 tablet 3   traZODone (DESYREL) 50 MG tablet Take 1 tablet (50 mg total) by mouth at bedtime. 30 tablet 1   DULoxetine (CYMBALTA) 20 MG capsule Take 1 capsule (20 mg total) by mouth 2 (two) times daily. (Patient not taking: Reported on 03/14/2022) 60 capsule 1   furosemide (LASIX) 20 MG tablet Take 1 tablet (20 mg total) by mouth daily as needed. (Patient not taking: Reported on 03/14/2022) 10 tablet 0   No current facility-administered medications on file prior to visit.    No Known Allergies  Social History   Socioeconomic History   Marital status: Divorced    Spouse name: Not on file   Number of children: Not on file   Years of education: Not on file   Highest education level: Not on file  Occupational History   Not on file  Tobacco Use   Smoking status: Every Day    Packs/day: 0.50    Years: 25.00    Total pack years: 12.50  Types: Cigarettes   Smokeless tobacco: Never  Vaping Use   Vaping Use: Never used  Substance and Sexual Activity   Alcohol use: Not Currently   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   Sexual activity: Yes    Birth control/protection: Surgical  Other Topics Concern   Not on file  Social History Narrative   Not on file   Social Determinants of Health   Financial Resource Strain: Not on file  Food Insecurity: Not on file  Transportation Needs: Not on file  Physical Activity: Not on file  Stress: Not on file  Social  Connections: Not on file  Intimate Partner Violence: Not on file    Family History  Problem Relation Age of Onset   Hypertension Mother    Hypertension Father    Diabetes Father    Heart disease Father     Past Surgical History:  Procedure Laterality Date   TONSILLECTOMY     TUBAL LIGATION      ROS: Review of Systems Negative except as stated above  PHYSICAL EXAM: BP (!) 145/88 (BP Location: Left Arm, Patient Position: Sitting, Cuff Size: Normal)   Pulse 82   Temp 98.1 F (36.7 C) (Oral)   Ht 5\' 1"  (1.549 m)   Wt 160 lb (72.6 kg)   SpO2 99%   BMI 30.23 kg/m   Physical Exam  General appearance - alert, well appearing, and in no distress Mental status - normal mood, behavior, speech, dress, motor activity, and thought processes Pelvic - CMA [place] present:  normal external genitalia, vulva, vagina, cervix, uterus and adnexa      Latest Ref Rng & Units 03/10/2022    4:03 PM 02/04/2022    7:57 PM 01/31/2022   11:19 AM  CMP  Glucose 70 - 99 mg/dL  85  98   BUN 6 - 20 mg/dL  20  11   Creatinine 04/02/2022 - 1.00 mg/dL  0.86  5.78   Sodium 4.69 - 145 mmol/L  137  139   Potassium 3.5 - 5.1 mmol/L  3.8  4.5   Chloride 98 - 111 mmol/L  104  103   CO2 22 - 32 mmol/L  25  21   Calcium 8.9 - 10.3 mg/dL  8.8  9.1   Total Protein 6.0 - 8.5 g/dL 5.8  6.0    Total Bilirubin 0.3 - 1.2 mg/dL  0.3    Alkaline Phos 38 - 126 U/L  45    AST 15 - 41 U/L  16    ALT 0 - 44 U/L  9     Lipid Panel     Component Value Date/Time   CHOL 278 (H) 06/07/2020 0728   TRIG 214 (H) 06/07/2020 0728   HDL 61 06/07/2020 0728   CHOLHDL 4.6 06/07/2020 0728   VLDL 43 (H) 06/07/2020 0728   LDLCALC 174 (H) 06/07/2020 0728    CBC    Component Value Date/Time   WBC 6.6 02/04/2022 1957   RBC 4.26 02/04/2022 1957   HGB 12.5 02/04/2022 1957   HCT 37.6 02/04/2022 1957   PLT 265 02/04/2022 1957   MCV 88.3 02/04/2022 1957   MCH 29.3 02/04/2022 1957   MCHC 33.2 02/04/2022 1957   RDW 12.7 02/04/2022  1957   LYMPHSABS 2.1 02/04/2022 1957   MONOABS 0.6 02/04/2022 1957   EOSABS 0.3 02/04/2022 1957   BASOSABS 0.0 02/04/2022 1957    ASSESSMENT AND PLAN:  1. Pap smear for  cervical cancer screening - Cytology - PAP  2. Encounter for screening mammogram for malignant neoplasm of breast - MM Digital Screening; Future  3. Essential hypertension Advised patient to check with her insurance to see if they would pay for her to get a home blood pressure monitoring device.  Continue to limit salt in the foods.  Continue spironolactone.  Follow-up with clinical pharmacist in 3 wks for repeat blood pressure check.    Patient was given the opportunity to ask questions.  Patient verbalized understanding of the plan and was able to repeat key elements of the plan.   This documentation was completed using Radio producer.  Any transcriptional errors are unintentional.  Orders Placed This Encounter  Procedures   MM Digital Screening     Requested Prescriptions    No prescriptions requested or ordered in this encounter    Return in about 4 months (around 07/13/2022) for Appt with [facility] in 3 wks for BP check.  Karle Plumber, MD, FACP

## 2022-03-15 LAB — ANA,IFA RA DIAG PNL W/RFLX TIT/PATN
ANA Titer 1: NEGATIVE
Cyclic Citrullin Peptide Ab: <1 units (ref 0–19)
Rheumatoid fact SerPl-aCnc: 10.7 IU/mL (ref <14.0)

## 2022-03-15 LAB — MULTIPLE MYELOMA PANEL, SERUM
Albumin SerPl Elph-Mcnc: 3.6 g/dL (ref 2.9–4.4)
Albumin/Glob SerPl: 1.7 (ref 0.7–1.7)
Alpha 1: 0.2 g/dL (ref 0.0–0.4)
Alpha2 Glob SerPl Elph-Mcnc: 0.6 g/dL (ref 0.4–1.0)
B-Globulin SerPl Elph-Mcnc: 0.8 g/dL (ref 0.7–1.3)
Gamma Glob SerPl Elph-Mcnc: 0.5 g/dL (ref 0.4–1.8)
Globulin, Total: 2.2 g/dL (ref 2.2–3.9)
IgA/Immunoglobulin A, Serum: 76 mg/dL — ABNORMAL LOW (ref 87–352)
IgG (Immunoglobin G), Serum: 453 mg/dL — ABNORMAL LOW (ref 586–1602)
IgM (Immunoglobulin M), Srm: 98 mg/dL (ref 26–217)
Total Protein: 5.8 g/dL — ABNORMAL LOW (ref 6.0–8.5)

## 2022-03-15 LAB — HEMOGLOBIN A1C
Est. average glucose Bld gHb Est-mCnc: 114 mg/dL
Hgb A1c MFr Bld: 5.6 % (ref 4.8–5.6)

## 2022-03-16 LAB — CYTOLOGY - PAP
Comment: NEGATIVE
Diagnosis: UNDETERMINED — AB
High risk HPV: NEGATIVE

## 2022-04-01 ENCOUNTER — Other Ambulatory Visit: Payer: Self-pay

## 2022-04-01 ENCOUNTER — Encounter (HOSPITAL_COMMUNITY): Payer: Self-pay | Admitting: Student in an Organized Health Care Education/Training Program

## 2022-04-01 ENCOUNTER — Telehealth (INDEPENDENT_AMBULATORY_CARE_PROVIDER_SITE_OTHER): Payer: Commercial Indemnity | Admitting: Student in an Organized Health Care Education/Training Program

## 2022-04-01 DIAGNOSIS — F3170 Bipolar disorder, currently in remission, most recent episode unspecified: Secondary | ICD-10-CM

## 2022-04-01 DIAGNOSIS — G4700 Insomnia, unspecified: Secondary | ICD-10-CM | POA: Diagnosis not present

## 2022-04-01 DIAGNOSIS — F411 Generalized anxiety disorder: Secondary | ICD-10-CM | POA: Diagnosis not present

## 2022-04-01 DIAGNOSIS — F4312 Post-traumatic stress disorder, chronic: Secondary | ICD-10-CM

## 2022-04-01 MED ORDER — BUSPIRONE HCL 5 MG PO TABS
5.0000 mg | ORAL_TABLET | Freq: Every day | ORAL | 1 refills | Status: DC
  Filled 2022-04-01 – 2022-04-15 (×2): qty 30, 30d supply, fill #0

## 2022-04-01 MED ORDER — TRAZODONE HCL 50 MG PO TABS
50.0000 mg | ORAL_TABLET | Freq: Every day | ORAL | 1 refills | Status: DC
  Filled 2022-04-01 – 2022-04-05 (×2): qty 30, 30d supply, fill #0

## 2022-04-01 MED ORDER — QUETIAPINE FUMARATE 200 MG PO TABS
200.0000 mg | ORAL_TABLET | Freq: Every day | ORAL | 1 refills | Status: DC
  Filled 2022-04-01 – 2022-04-05 (×2): qty 30, 30d supply, fill #0
  Filled 2022-05-06: qty 30, 30d supply, fill #1
  Filled 2022-05-09: qty 30, 30d supply, fill #0

## 2022-04-01 NOTE — Progress Notes (Signed)
Virtual Visit via Video Note  I connected with Alice Martinez on 04/01/22 at  1:00 PM EST by a video enabled telemedicine application and verified that I am speaking with the correct person using two identifiers.  Location: Patient: parking lot at work Provider: [facility]   I discussed the limitations of evaluation and management by telemedicine and the availability of in person appointments. The patient expressed understanding and agreed to proceed.  History of Present Illness:  Alice Martinez is a 46 yr old female who presents via Virtual Video Visit for follow up and for medication management. PPHx is significant for Bipolar Disorder, PTSD, History of Polysubstance Abuse (EtOH, Fentanyl, Heroin, Xanax, Cocaine), GAD, MDD, and OCD, prior suicide attempts (OD x2 Dec 2022), 2 hospitalizations (latest [facility] 06/2020), and Residential Rehab [facility] 08/2021).   She reports that she has been doing well from a psychiatric standpoint since her last appointment.  She reports that she has been having continued swelling and nerve pain and that she is scheduled for an MRI 12/5 and will be seeing an RA specialist afterwards.  She reports her Seroquel continues to do well and her mood is stable.  She reports no side effects with her psychiatric medications.  She reports no SI, HI, or AVH.  She reports her sleep is good.  She reports her appetite is doing good.  She reports no other concerns at present.  She will return for follow-up in approximately 6 weeks.    Observations/Objective:  Psychiatric Specialty Exam:   Review of Systems  Respiratory:  Negative for shortness of breath.   Cardiovascular:  Negative for chest pain.  Gastrointestinal:  Negative for abdominal pain, constipation, diarrhea, nausea and vomiting.  Neurological:  Negative for dizziness, weakness and headaches.  Psychiatric/Behavioral:  Negative for dysphoric mood, hallucinations, sleep disturbance and suicidal ideas. The patient is not  nervous/anxious.     There were no vitals taken for this visit.There is no height or weight on file to calculate BMI.  General Appearance: Casual and Fairly Groomed  Eye Contact:  Good  Speech:  Clear and Coherent and Normal Rate  Volume:  Normal  Mood:   "ok"  Affect:  Appropriate and Congruent  Thought Process:  Coherent and Goal Directed  Orientation:  Full (Time, Place, and Person)  Thought Content:  WDL and Logical  Suicidal Thoughts:  No  Homicidal Thoughts:  No  Memory:  Immediate;   Good Recent;   Good  Judgement:  Good  Insight:  Good  Psychomotor Activity:  Normal  Concentration:  Concentration: Good and Attention Span: Good  Recall:  Good  Fund of Knowledge:  Good  Language:  Good  Akathisia:  Negative  Handed:  Right  AIMS (if indicated):   not done  Assets:  Communication Skills Desire for Improvement Resilience Social Support Vocational/Educational  ADL's:  Intact  Cognition:  WNL  Sleep:   Good     Assessment and Plan:  Alice Martinez is a 46 yr old female who presents via Virtual Video Visit for follow up and for medication management. PPHx is significant for Bipolar Disorder, PTSD, History of Polysubstance Abuse (EtOH, Fentanyl, Heroin, Xanax, Cocaine), GAD, MDD, and OCD, prior suicide attempts (OD x2 Dec 2022), 2 hospitalizations (latest [facility] 06/2020), and Residential Rehab [facility] 08/2021).    Alice Martinez is doing well and is stable from a psychiatric standpoint.  We will not make any changes to her medications at this time.  She will return for follow-up in approximately  6 weeks.   Bipolar Disorder  Chronic PTSD: -Continue Seroquel 200 mg QHS.  30 tablets with 1 refill. -Continue Buspar 5 QHS.  30 tablets with 1 refill.     History of Polysubstance Abuse (EtOH, Fentanyl, Heroin, Xanax, Cocaine): -Encouraged continuing abstinence from all substances      Insomnia: -Continue Trazodone 50 mg QHS.  30 tablets with 1 refill.   Follow Up  Instructions:    I discussed the assessment and treatment plan with the patient. The patient was provided an opportunity to ask questions and all were answered. The patient agreed with the plan and demonstrated an understanding of the instructions.   The patient was advised to call back or seek an in-person evaluation if the symptoms worsen or if the condition fails to improve as anticipated.  I provided 5 minutes of non-face-to-face time during this encounter.   Lauro Franklin, MD

## 2022-04-04 ENCOUNTER — Encounter: Payer: Self-pay | Admitting: Diagnostic Neuroimaging

## 2022-04-05 ENCOUNTER — Other Ambulatory Visit: Payer: Self-pay

## 2022-04-05 ENCOUNTER — Ambulatory Visit
Admission: RE | Admit: 2022-04-05 | Discharge: 2022-04-05 | Disposition: A | Discharge status: Home / Self Care | Payer: Commercial Managed Care - HMO | Source: Ambulatory Visit | Attending: Diagnostic Neuroimaging | Admitting: Diagnostic Neuroimaging

## 2022-04-05 ENCOUNTER — Other Ambulatory Visit: Payer: Self-pay | Admitting: Internal Medicine

## 2022-04-05 ENCOUNTER — Other Ambulatory Visit (HOSPITAL_COMMUNITY): Payer: Self-pay

## 2022-04-05 DIAGNOSIS — R200 Anesthesia of skin: Secondary | ICD-10-CM

## 2022-04-05 DIAGNOSIS — M62838 Other muscle spasm: Secondary | ICD-10-CM | POA: Diagnosis not present

## 2022-04-05 DIAGNOSIS — I10 Essential (primary) hypertension: Secondary | ICD-10-CM

## 2022-04-05 MED ORDER — SPIRONOLACTONE 25 MG PO TABS
12.5000 mg | ORAL_TABLET | Freq: Every day | ORAL | 1 refills | Status: DC
  Filled 2022-04-05: qty 15, 30d supply, fill #0
  Filled 2022-04-15: qty 45, 90d supply, fill #0

## 2022-04-05 MED ORDER — GADOPICLENOL 0.5 MMOL/ML IV SOLN
7.5000 mL | Freq: Once | INTRAVENOUS | Status: AC | PRN
  Administered 2022-04-05: 7.5 mL via INTRAVENOUS

## 2022-04-06 ENCOUNTER — Other Ambulatory Visit (HOSPITAL_COMMUNITY): Payer: Self-pay

## 2022-04-07 ENCOUNTER — Other Ambulatory Visit: Payer: Self-pay

## 2022-04-11 ENCOUNTER — Other Ambulatory Visit (HOSPITAL_COMMUNITY): Payer: Self-pay

## 2022-04-12 ENCOUNTER — Other Ambulatory Visit: Payer: Self-pay

## 2022-04-15 ENCOUNTER — Other Ambulatory Visit: Payer: Self-pay

## 2022-04-19 ENCOUNTER — Ambulatory Visit: Payer: Commercial Managed Care - HMO | Admitting: Pharmacist

## 2022-04-21 ENCOUNTER — Other Ambulatory Visit: Payer: Self-pay

## 2022-05-06 ENCOUNTER — Other Ambulatory Visit (HOSPITAL_COMMUNITY): Payer: Self-pay

## 2022-05-06 ENCOUNTER — Other Ambulatory Visit: Payer: Self-pay

## 2022-05-09 ENCOUNTER — Other Ambulatory Visit (HOSPITAL_COMMUNITY): Payer: Self-pay

## 2022-05-09 ENCOUNTER — Other Ambulatory Visit: Payer: Self-pay

## 2022-05-10 ENCOUNTER — Other Ambulatory Visit (HOSPITAL_COMMUNITY): Payer: Self-pay

## 2022-05-10 ENCOUNTER — Other Ambulatory Visit: Payer: Self-pay

## 2022-05-12 ENCOUNTER — Inpatient Hospital Stay: Admission: RE | Admit: 2022-05-12 | Payer: Commercial Managed Care - HMO | Source: Ambulatory Visit

## 2022-05-13 ENCOUNTER — Telehealth (INDEPENDENT_AMBULATORY_CARE_PROVIDER_SITE_OTHER): Payer: Commercial Managed Care - HMO | Admitting: Student in an Organized Health Care Education/Training Program

## 2022-05-13 ENCOUNTER — Ambulatory Visit: Admission: RE | Admit: 2022-05-13 | Payer: Commercial Managed Care - HMO | Source: Ambulatory Visit

## 2022-05-13 ENCOUNTER — Encounter (HOSPITAL_COMMUNITY): Payer: Self-pay | Admitting: Student in an Organized Health Care Education/Training Program

## 2022-05-13 DIAGNOSIS — F4312 Post-traumatic stress disorder, chronic: Secondary | ICD-10-CM

## 2022-05-13 DIAGNOSIS — G4700 Insomnia, unspecified: Secondary | ICD-10-CM

## 2022-05-13 DIAGNOSIS — F3170 Bipolar disorder, currently in remission, most recent episode unspecified: Secondary | ICD-10-CM

## 2022-05-13 DIAGNOSIS — F411 Generalized anxiety disorder: Secondary | ICD-10-CM | POA: Diagnosis not present

## 2022-05-13 MED ORDER — QUETIAPINE FUMARATE 200 MG PO TABS
200.0000 mg | ORAL_TABLET | Freq: Every day | ORAL | 1 refills | Status: DC
  Filled 2022-06-06: qty 30, 30d supply, fill #0

## 2022-05-13 MED ORDER — TRAZODONE HCL 50 MG PO TABS
50.0000 mg | ORAL_TABLET | Freq: Every day | ORAL | 1 refills | Status: DC
  Filled 2022-06-06: qty 30, 30d supply, fill #0

## 2022-05-13 MED ORDER — BUSPIRONE HCL 5 MG PO TABS
5.0000 mg | ORAL_TABLET | Freq: Two times a day (BID) | ORAL | 1 refills | Status: DC
  Filled 2022-06-06: qty 60, 30d supply, fill #0

## 2022-05-13 NOTE — Progress Notes (Signed)
BH MD/PA/NP OP Progress Note  Virtual Visit via Video Note  I connected with Alice Martinez on 05/13/22 at  1:30 PM EST by a video enabled telemedicine application and verified that I am speaking with the correct person using two identifiers.  Location: Patient: Work Provider: [facility]   I discussed the limitations of evaluation and management by telemedicine and the availability of in person appointments. The patient expressed understanding and agreed to proceed.  05/13/2022 2:17 PM QUANDA PAVLICEK  MRN:  [id number]  Chief Complaint:  Chief Complaint  Patient presents with   Follow-up   Depression   Anxiety   Agitation   HPI:  Alice Martinez is a 47 yr old female who presents via Virtual Video Visit for Follow Up and for Medication Management. PPHx is significant for Bipolar Disorder, PTSD, History of Polysubstance Abuse (EtOH, Fentanyl, Heroin, Xanax, Cocaine), GAD, MDD, and OCD, prior suicide attempts (OD x2 Dec 2022), 2 hospitalizations (latest [facility] 06/2020), and [facility] 08/2021).   She reports that she has been more agitated/irritable since her last appointment.  She reports that she just finds her self getting angry quicker.  She reports that she has noticed some worsening of her anxiety but it does center around her job so that could be the source of it.  She does report that she will be starting a new job in the next few weeks so this could resolve on its own.  She reports no other changes since her last appointment.  Discussed increasing the frequency of her BuSpar to provide more even coverage of her anxiety and she was agreeable to this.  Discussed that if this did not improve things we would consider medication changes at the next appointment and she was agreeable with this.  She reports no SI, HI, or AVH.  She reports her sleep is good.  She reports her appetite is doing good.  She reports no other concerns at present.  She will return for follow-up approximately  6 weeks.   Visit Diagnosis:    ICD-10-CM   1. GAD (generalized anxiety disorder)  F41.1 busPIRone (BUSPAR) 5 MG tablet    QUEtiapine (SEROQUEL) 200 MG tablet    2. Insomnia, unspecified type  G47.00 traZODone (DESYREL) 50 MG tablet    3. Chronic post-traumatic stress disorder (PTSD)  F43.12 QUEtiapine (SEROQUEL) 200 MG tablet    4. Bipolar affective disorder in remission ([place])  F31.70 QUEtiapine (SEROQUEL) 200 MG tablet      Past Psychiatric History: Bipolar Disorder, PTSD, History of Polysubstance Abuse (EtOH, Fentanyl, Heroin, Xanax, Cocaine), GAD, MDD, and OCD, prior suicide attempts (OD x2 Dec 2022), 2 hospitalizations (latest [facility] 06/2020), and [facility] 08/2021).   Past Medical History:  Past Medical History:  Diagnosis Date   ADHD (attention deficit hyperactivity disorder)    Depression    High cholesterol    Hypertension    Thyroid disease     Past Surgical History:  Procedure Laterality Date   TONSILLECTOMY     TUBAL LIGATION      Family Psychiatric History: Mother- unknown diagnosis and Substance Use Father- Depression and EtOH Abuse  Family History:  Family History  Problem Relation Age of Onset   Hypertension Mother    Hypertension Father    Diabetes Father    Heart disease Father     Social History:  Social History   Socioeconomic History   Marital status: Divorced    Spouse name: Not on file  Number of children: Not on file   Years of education: Not on file   Highest education level: Not on file  Occupational History   Not on file  Tobacco Use   Smoking status: Every Day    Packs/day: 0.50    Years: 25.00    Total pack years: 12.50    Types: Cigarettes   Smokeless tobacco: Never  Vaping Use   Vaping Use: Never used  Substance and Sexual Activity   Alcohol use: Not Currently   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   Sexual activity: Yes    Birth control/protection: Surgical  Other Topics Concern   Not on file   Social History Narrative   Not on file   Social Determinants of Health   Financial Resource Strain: Not on file  Food Insecurity: Not on file  Transportation Needs: Not on file  Physical Activity: Not on file  Stress: Not on file  Social Connections: Not on file    Allergies: No Known Allergies  Metabolic Disorder Labs: Lab Results  Component Value Date   HGBA1C 5.6 03/10/2022   MPG 116.89 06/07/2020   No results found for: "PROLACTIN" Lab Results  Component Value Date   CHOL 278 (H) 06/07/2020   TRIG 214 (H) 06/07/2020   HDL 61 06/07/2020   CHOLHDL 4.6 06/07/2020   VLDL 43 (H) 06/07/2020   LDLCALC 174 (H) 06/07/2020   Lab Results  Component Value Date   TSH 0.862 01/01/2022   TSH 2.028 04/04/2021    Therapeutic Level Labs: No results found for: "LITHIUM" No results found for: "VALPROATE" No results found for: "CBMZ"  Current Medications: Current Outpatient Medications  Medication Sig Dispense Refill   Buprenorphine HCl-Naloxone HCl 8-2 MG FILM Place under the tongue daily.     busPIRone (BUSPAR) 5 MG tablet Take 1 tablet (5 mg total) by mouth 2 (two) times daily. 60 tablet 1   furosemide (LASIX) 20 MG tablet Take 1 tablet (20 mg total) by mouth daily as needed. (Patient not taking: Reported on 03/14/2022) 10 tablet 0   levothyroxine (SYNTHROID) 25 MCG tablet Take 1 tablet (25 mcg total) by mouth daily before breakfast. 30 tablet 5   QUEtiapine (SEROQUEL) 200 MG tablet Take 1 tablet (200 mg total) by mouth at bedtime. 30 tablet 1   spironolactone (ALDACTONE) 25 MG tablet Take 0.5 tablets (12.5 mg total) by mouth daily. 45 tablet 1   traZODone (DESYREL) 50 MG tablet Take 1 tablet (50 mg total) by mouth at bedtime. 30 tablet 1   No current facility-administered medications for this visit.     Musculoskeletal: Strength & Muscle Tone: within normal limits Gait & Station: normal Patient leans: N/A  Psychiatric Specialty Exam: Review of Systems   Respiratory:  Negative for shortness of breath.   Cardiovascular:  Negative for chest pain.  Gastrointestinal:  Negative for abdominal pain, constipation, diarrhea, nausea and vomiting.  Neurological:  Negative for dizziness, weakness and headaches.  Psychiatric/Behavioral:  Positive for agitation. Negative for dysphoric mood, hallucinations, sleep disturbance and suicidal ideas. The patient is nervous/anxious.     There were no vitals taken for this visit.There is no height or weight on file to calculate BMI.  General Appearance: Casual and Fairly Groomed  Eye Contact:  Good  Speech:  Clear and Coherent and Normal Rate  Volume:  Normal  Mood:  Irritable  Affect:  Congruent  Thought Process:  Coherent and Goal Directed  Orientation:  Full (Time,  Place, and Person)  Thought Content: WDL and Logical   Suicidal Thoughts:  No  Homicidal Thoughts:  No  Memory:  Immediate;   Good Recent;   Good  Judgement:  Good  Insight:  Good  Psychomotor Activity:  Normal  Concentration:  Concentration: Good and Attention Span: Good  Recall:  Good  Fund of Knowledge: Good  Language: Good  Akathisia:  Negative  Handed:  Right  AIMS (if indicated): not done  Assets:  Communication Skills Desire for Improvement Resilience Social Support Vocational/Educational  ADL's:  Intact  Cognition: WNL  Sleep:  Good   Screenings: AIMS    Flowsheet Row [facility] (Discharged[facility]  AIMS Total Score 0      AUDIT    Flowsheet Row Admission (Discharged) from [facility]  Alcohol Use Disorder Identification Test Final Score (AUDIT) 0      GAD-7    Flowsheet Row Office Visit from 03/14/2022 in [facility] Office Visit from 01/31/2022 in [facility] Office Visit from 11/12/2021 in [facility] Office Visit from 02/25/2021 in [facility] [place] Office Visit from 05/26/2016 in [facility] [place]  Total GAD-7 Score 3 4 0 21 18      PHQ2-9    Flowsheet Row Office Visit from 03/14/2022 in [facility] Office Visit from 01/31/2022 in [facility] Office Visit from 11/12/2021 in [facility] Counselor from 05/05/2021 in [facility] AT [place] Office Visit from 02/25/2021 in [facility] AT [place]  PHQ-2 Total Score 0 1 0 2 4  PHQ-9 Total Score 0 11 -- 8 16      Flowsheet Row ED from 02/04/2022 in [facility] ED to Hosp-Admission (Discharged) from 01/01/2022 in [facility] Video Visit from 06/02/2021 in [facility] AT [place]  C-SSRS RISK CATEGORY No Risk No Risk Error: Q3, 4, or 5 should not be populated when Q2 is No        Assessment and Plan:  Tesneem Dufrane is a 47 yr old female who presents via Virtual Video Visit for Follow Up and for Medication Management. PPHx is significant for Bipolar Disorder, PTSD, History of Polysubstance Abuse (EtOH, Fentanyl, Heroin, Xanax, Cocaine), GAD, MDD, and OCD, prior suicide attempts (OD x2 Dec 2022), 2 hospitalizations (latest [facility] 06/2020), and [facility] 08/2021).    Kambra reports that she has been more agitated/irritable since her last appointment.  She does think that some of this could be from anxiety or from her work.  We will change her BuSpar to twice a day.  May consider switching to low-dose Seroquel if this continues to be an issue.  She will return for follow-up in approximately 6 weeks.   Bipolar Disorder  Chronic PTSD: -Continue Seroquel 200 mg QHS.  30 tablets with 1 refill. -Increase frequency of Buspar to 5 BID.  60  tablets with 1 refill.     History of Polysubstance Abuse (EtOH, Fentanyl, Heroin, Xanax, Cocaine): -Encouraged continuing abstinence from all substances      Insomnia: -Continue Trazodone 50 mg QHS.  30 tablets with 1 refill.   Collaboration of Care:   Patient/Guardian was advised Release of Information must be  obtained prior to any record release in order to collaborate their care with an outside provider. Patient/Guardian was advised if they have not already done so to contact the registration department to sign all necessary forms in order for [place] to release information regarding their care.   Consent: Patient/Guardian gives verbal consent for treatment and assignment of benefits for services provided during this visit. Patient/Guardian expressed understanding and agreed to proceed.    Briant Cedar, MD 05/13/2022, 2:17 PM   Follow Up Instructions:    I discussed the assessment and treatment plan with the patient. The patient was provided an opportunity to ask questions and all were answered. The patient agreed with the plan and demonstrated an understanding of the instructions.   The patient was advised to call back or seek an in-person evaluation if the symptoms worsen or if the condition fails to improve as anticipated.  I provided 5 minutes of non-face-to-face time during this encounter.   Briant Cedar, MD

## 2022-05-17 ENCOUNTER — Other Ambulatory Visit (HOSPITAL_COMMUNITY): Payer: Self-pay

## 2022-06-02 ENCOUNTER — Encounter (HOSPITAL_COMMUNITY): Payer: Self-pay

## 2022-06-02 ENCOUNTER — Other Ambulatory Visit (HOSPITAL_COMMUNITY): Payer: Self-pay

## 2022-06-03 ENCOUNTER — Other Ambulatory Visit (HOSPITAL_COMMUNITY): Payer: Self-pay

## 2022-06-06 ENCOUNTER — Other Ambulatory Visit (HOSPITAL_COMMUNITY): Payer: Self-pay

## 2022-06-06 ENCOUNTER — Other Ambulatory Visit: Payer: Self-pay

## 2022-06-06 MED ORDER — SPIRONOLACTONE 25 MG PO TABS
ORAL_TABLET | ORAL | 0 refills | Status: DC
  Filled 2022-06-06: qty 45, 90d supply, fill #0

## 2022-06-09 ENCOUNTER — Encounter: Payer: Commercial Managed Care - HMO | Admitting: Internal Medicine

## 2022-06-10 ENCOUNTER — Other Ambulatory Visit (HOSPITAL_COMMUNITY): Payer: Self-pay

## 2022-06-24 ENCOUNTER — Encounter (HOSPITAL_COMMUNITY): Payer: Self-pay | Admitting: Student in an Organized Health Care Education/Training Program

## 2022-06-24 ENCOUNTER — Other Ambulatory Visit (HOSPITAL_COMMUNITY): Payer: Self-pay

## 2022-06-24 ENCOUNTER — Other Ambulatory Visit: Payer: Self-pay

## 2022-06-24 ENCOUNTER — Telehealth (INDEPENDENT_AMBULATORY_CARE_PROVIDER_SITE_OTHER): Payer: Medicaid Other | Admitting: Student in an Organized Health Care Education/Training Program

## 2022-06-24 DIAGNOSIS — F411 Generalized anxiety disorder: Secondary | ICD-10-CM

## 2022-06-24 DIAGNOSIS — G4700 Insomnia, unspecified: Secondary | ICD-10-CM

## 2022-06-24 DIAGNOSIS — F4312 Post-traumatic stress disorder, chronic: Secondary | ICD-10-CM

## 2022-06-24 DIAGNOSIS — F3170 Bipolar disorder, currently in remission, most recent episode unspecified: Secondary | ICD-10-CM | POA: Diagnosis not present

## 2022-06-24 MED ORDER — TRAZODONE HCL 50 MG PO TABS
50.0000 mg | ORAL_TABLET | Freq: Every day | ORAL | 1 refills | Status: DC
  Filled 2022-06-24 – 2022-07-08 (×2): qty 30, 30d supply, fill #0
  Filled 2022-08-01: qty 30, 30d supply, fill #1

## 2022-06-24 MED ORDER — QUETIAPINE FUMARATE 200 MG PO TABS
200.0000 mg | ORAL_TABLET | Freq: Every day | ORAL | 1 refills | Status: DC
  Filled 2022-06-24 – 2022-07-08 (×2): qty 30, 30d supply, fill #0
  Filled 2022-08-01: qty 30, 30d supply, fill #1

## 2022-06-24 MED ORDER — BUSPIRONE HCL 5 MG PO TABS
5.0000 mg | ORAL_TABLET | Freq: Two times a day (BID) | ORAL | 1 refills | Status: DC
  Filled 2022-06-24 – 2022-08-01 (×2): qty 60, 30d supply, fill #0

## 2022-06-24 NOTE — Progress Notes (Signed)
BH MD/PA/NP OP Progress Note  Virtual Visit via Video Note  I connected with Alice Martinez on 06/24/22 at  1:30 PM EST by a video enabled telemedicine application and verified that I am speaking with the correct person using two identifiers.  Location: Patient: Home Provider: [facility]   I discussed the limitations of evaluation and management by telemedicine and the availability of in person appointments. The patient expressed understanding and agreed to proceed.   06/24/2022 1:53 PM Alice Martinez  MRN:  [id number]  Chief Complaint:  Chief Complaint  Patient presents with   Follow-up   Medication Refill   HPI:  Alice Martinez is a 47 yr old female who presents via Virtual Video Visit for Follow Up and Medication Management. PPHx is significant for Bipolar Disorder, PTSD, History of Polysubstance Abuse (EtOH, Fentanyl, Heroin, Xanax, Cocaine), GAD, MDD, and OCD, prior suicide attempts (OD x2 Dec 2022), 2 hospitalizations (latest [facility] 06/2020), and [facility] 08/2021).    She reports that she has been doing much better since our last appointment.  She reports she thinks it is due to a combination of both the increase in her BuSpar but also changing jobs.  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  She reports another stressor was she had been trying to stop her Suboxone at that time but after discussion decided to merely decrease from 2 strips to 1 strip a day.  She reports she feels like she is not ready at this time to completely go off the Suboxone.  She reports her mood has been much more stable and she is not ill all the time.  She reports no side effects to her medications.  She reports she may be having some "hormonal stuff" going on but that she has an appointment with her PCP on March 14 to further discuss this.  She was no SI, HI, or AVH.  She reports her sleep  is good.  She reports her appetite is doing good.  She reports no other concerns at present.  She will return for follow-up in approximately 6 to 8 weeks.  Visit Diagnosis:    ICD-10-CM   1. GAD (generalized anxiety disorder)  F41.1 QUEtiapine (SEROQUEL) 200 MG tablet    busPIRone (BUSPAR) 5 MG tablet    2. Chronic post-traumatic stress disorder (PTSD)  F43.12 QUEtiapine (SEROQUEL) 200 MG tablet    3. Bipolar affective disorder in remission (H[facility])  F31.70 QUEtiapine (SEROQUEL) 200 MG tablet    4. Insomnia, unspecified type  G47.00 traZODone (DESYREL) 50 MG tablet      Past Psychiatric History: Bipolar Disorder, PTSD, History of Polysubstance Abuse (EtOH, Fentanyl, Heroin, Xanax, Cocaine), GAD, MDD, and OCD, prior suicide attempts (OD x2 Dec 2022), 2 hospitalizations (latest [facility] 06/2020), and [facility] 08/2021).   Past Medical History:  Past Medical History:  Diagnosis Date   ADHD (attention deficit hyperactivity disorder)    Depression    High cholesterol    Hypertension    Thyroid disease     Past Surgical History:  Procedure Laterality Date   TONSILLECTOMY     TUBAL LIGATION      Family Psychiatric History: Mother- unknown diagnosis and Substance Use Father- Depression and EtOH Abuse  Family History:  Family History  Problem Relation Age of Onset   Hypertension Mother    Hypertension Father    Diabetes Father  Heart disease Father     Social History:  Social History   Socioeconomic History   Marital status: Divorced    Spouse name: Not on file   Number of children: Not on file   Years of education: Not on file   Highest education level: Not on file  Occupational History   Not on file  Tobacco Use   Smoking status: Every Day    Packs/day: 0.50    Years: 25.00    Total pack years: 12.50    Types: Cigarettes   Smokeless tobacco: Never  Vaping Use   Vaping Use: Never used  Substance and Sexual Activity   Alcohol use: Not Currently    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   Sexual activity: Yes    Birth control/protection: Surgical  Other Topics Concern   Not on file  Social History Narrative   Not on file   Social Determinants of Health   Financial Resource Strain: Not on file  Food Insecurity: Not on file  Transportation Needs: Not on file  Physical Activity: Not on file  Stress: Not on file  Social Connections: Not on file    Allergies: No Known Allergies  Metabolic Disorder Labs: Lab Results  Component Value Date   HGBA1C 5.6 03/10/2022   MPG 116.89 06/07/2020   No results found for: "PROLACTIN" Lab Results  Component Value Date   CHOL 278 (H) 06/07/2020   TRIG 214 (H) 06/07/2020   HDL 61 06/07/2020   CHOLHDL 4.6 06/07/2020   VLDL 43 (H) 06/07/2020   LDLCALC 174 (H) 06/07/2020   Lab Results  Component Value Date   TSH 0.862 01/01/2022   TSH 2.028 04/04/2021    Therapeutic Level Labs: No results found for: "LITHIUM" No results found for: "VALPROATE" No results found for: "CBMZ"  Current Medications: Current Outpatient Medications  Medication Sig Dispense Refill   Buprenorphine HCl-Naloxone HCl 8-2 MG FILM Place under the tongue daily.     busPIRone (BUSPAR) 5 MG tablet Take 1 tablet (5 mg total) by mouth 2 (two) times daily. 60 tablet 1   furosemide (LASIX) 20 MG tablet Take 1 tablet (20 mg total) by mouth daily as needed. (Patient not taking: Reported on 03/14/2022) 10 tablet 0   levothyroxine (SYNTHROID) 25 MCG tablet Take 1 tablet (25 mcg total) by mouth daily before breakfast. 30 tablet 5   QUEtiapine (SEROQUEL) 200 MG tablet Take 1 tablet (200 mg total) by mouth at bedtime. 30 tablet 1   spironolactone (ALDACTONE) 25 MG tablet Take 1/2 tablet by mouth daily 45 tablet 0   traZODone (DESYREL) 50 MG tablet Take 1 tablet (50 mg total) by mouth at bedtime. 30 tablet 1   No current facility-administered medications for this visit.     Musculoskeletal: Strength & Muscle Tone:  within normal limits Gait & Station: normal Patient leans: N/A  Psychiatric Specialty Exam: Review of Systems  Respiratory:  Negative for shortness of breath.   Cardiovascular:  Negative for chest pain.  Gastrointestinal:  Negative for abdominal pain, constipation, diarrhea, nausea and vomiting.  Neurological:  Negative for dizziness, weakness and headaches.  Psychiatric/Behavioral:  Negative for dysphoric mood, hallucinations, sleep disturbance and suicidal ideas. The patient is not nervous/anxious.     There were no vitals taken for this visit.There is no height or weight on file to calculate BMI.  General Appearance: Casual and Fairly Groomed  Eye Contact:  Good  Speech:  Clear and Coherent and Normal  Rate  Volume:  Normal  Mood:  Euthymic  Affect:  Appropriate and Congruent  Thought Process:  Coherent and Goal Directed  Orientation:  Full (Time, Place, and Person)  Thought Content: WDL and Logical   Suicidal Thoughts:  No  Homicidal Thoughts:  No  Memory:  Immediate;   Good Recent;   Good  Judgement:  Good  Insight:  Good  Psychomotor Activity:  Normal  Concentration:  Concentration: Good and Attention Span: Good  Recall:  Good  Fund of Knowledge: Good  Language: Good  Akathisia:  Negative  Handed:  Right  AIMS (if indicated): not done  Assets:  Communication Skills Desire for Improvement Resilience Social Support Vocational/Educational  ADL's:  Intact  Cognition: WNL  Sleep:  Good   Screenings: AIMS    Flowsheet Row Admission (Discharged) from OP Visit from 06/06/2020 in [place] [facility]  AIMS Total Score 0      AUDIT    Flowsheet Row Admission (Discharged) from OP Visit from 06/06/2020 in [place] [facility]  Alcohol Use Disorder Identification Test Final Score (AUDIT) 0      GAD-7    Flowsheet Row Office Visit from 03/14/2022 in [place] Office Visit from  01/31/2022 in [place] Office Visit from 11/12/2021 in [facility] Office Visit from 02/25/2021 in [place] at [place] Visit from 05/26/2016 in [place] [facility]  Total GAD-7 Score 3 4 0 21 18      PHQ2-9    [place] Office Visit from 03/14/2022 in [place] Office Visit from 01/31/2022 in [place] Office Visit from 11/12/2021 in [facility] Counselor from 05/05/2021 in [place] at [place] Visit from 02/25/2021 in [place] at [facility]  PHQ-2 Total Score 0 1 0 2 4  PHQ-9 Total Score 0 11 -- 8 [place] ED [facility]/04/20[facility] ED to Hosp-Admission (Discharged) from 01/01/2022 in [place] Video Visit from 06/02/2021 in [place] at [place] No Risk No Risk Error: Q3, 4, or 5 should not be populated when Q2 is No        Assessment and Plan:  Alice Martinez is a 47 yr old female who presents via Virtual Video Visit for Follow Up and Medication Management. PPHx is significant for Bipolar Disorder, PTSD, History of Polysubstance Abuse (EtOH, Fentanyl, Heroin, Xanax, Cocaine), GAD, MDD, and OCD, prior suicide attempts (OD x2 Dec 2022), 2 hospitalizations (latest [facility] 06/2020), and [facility] 08/2021).     Alice Martinez has been doing much better since her last appointment due to a combination of both the increase in her BuSpar and the reduction of stress by switching jobs.  We will not make any changes to her medications at this time.  Refills will be sent in.  She  will return for follow-up in 6 to 8 weeks.   Bipolar Disorder  Chronic PTSD: -Continue Seroquel 200 mg QHS.  30 tablets with 1 refill. -Continue Buspar 5 BID.  60 tablets with 1 refill.     History of Polysubstance Abuse (EtOH, Fentanyl, Heroin, Xanax,  Cocaine): -Encouraged continuing abstinence from all substances      Insomnia: -Continue Trazodone 50 mg QHS.  30 tablets with 1 refill.    Collaboration of Care:  Patient/Guardian was advised Release of Information must be obtained prior to any record release in order to collaborate their care with an outside provider. Patient/Guardian was advised if they have not already done so to contact the registration department to sign all necessary forms in order for [place] to release information regarding their care.   Consent: Patient/Guardian gives verbal consent for treatment and assignment of benefits for services provided during this visit. Patient/Guardian expressed understanding and agreed to proceed.    Briant Cedar, MD 06/24/2022, 1:53 PM   Follow Up Instructions:    I discussed the assessment and treatment plan with the patient. The patient was provided an opportunity to ask questions and all were answered. The patient agreed with the plan and demonstrated an understanding of the instructions.   The patient was advised to call back or seek an in-person evaluation if the symptoms worsen or if the condition fails to improve as anticipated.  I provided 6 minutes of non-face-to-face time during this encounter.   Briant Cedar, MD

## 2022-07-08 ENCOUNTER — Other Ambulatory Visit (HOSPITAL_COMMUNITY): Payer: Self-pay

## 2022-07-08 ENCOUNTER — Other Ambulatory Visit: Payer: Self-pay | Admitting: Internal Medicine

## 2022-07-09 ENCOUNTER — Other Ambulatory Visit (HOSPITAL_COMMUNITY): Payer: Self-pay

## 2022-07-11 ENCOUNTER — Other Ambulatory Visit (HOSPITAL_COMMUNITY): Payer: Self-pay

## 2022-07-11 MED FILL — Spironolactone Tab 25 MG: ORAL | Qty: 45 | Fill #0 | Status: CN

## 2022-07-11 NOTE — Telephone Encounter (Signed)
Requested Prescriptions  Pending Prescriptions Disp Refills   spironolactone (ALDACTONE) 25 MG tablet 45 tablet 0    Sig: Take 1/2 tablet by mouth daily     Cardiovascular: Diuretics - Aldosterone Antagonist Failed - 07/08/2022  6:08 PM      Failed - Cr in normal range and within 180 days    Creatinine, Ser  Date Value Ref Range Status  02/04/2022 1.09 (H) 0.44 - 1.00 mg/dL Final   Creatinine, Urine  Date Value Ref Range Status  01/03/2022 113 mg/dL Final         Failed - Last BP in normal range    BP Readings from Last 1 Encounters:  03/14/22 (!) 145/88         Passed - K in normal range and within 180 days    Potassium  Date Value Ref Range Status  02/04/2022 3.8 3.5 - 5.1 mmol/L Final         Passed - Na in normal range and within 180 days    Sodium  Date Value Ref Range Status  02/04/2022 137 135 - 145 mmol/L Final  01/31/2022 139 134 - 144 mmol/L Final         Passed - eGFR is 30 or above and within 180 days    GFR calc Af Amer  Date Value Ref Range Status  03/10/2019 >60 >60 mL/min Final   GFR, Estimated  Date Value Ref Range Status  02/04/2022 >60 >60 mL/min Final    Comment:    (NOTE) Calculated using the CKD-EPI Creatinine Equation (2021)    eGFR  Date Value Ref Range Status  01/31/2022 102 >59 mL/min/1.73 Final         Passed - Valid encounter within last 6 months    Recent Outpatient Visits           3 months ago Pap smear for cervical cancer screening   [place], MD   5 months ago Establishing care with new doctor, encounter for   [place], MD       Future Appointments             In 3 days Ladell Pier, MD [place]

## 2022-07-14 ENCOUNTER — Other Ambulatory Visit: Payer: Self-pay | Admitting: Internal Medicine

## 2022-07-14 ENCOUNTER — Telehealth: Payer: Self-pay

## 2022-07-14 ENCOUNTER — Encounter: Payer: Self-pay | Admitting: Internal Medicine

## 2022-07-14 ENCOUNTER — Ambulatory Visit: Payer: Medicaid Other | Admitting: Internal Medicine

## 2022-07-14 VITALS — BP 138/88 | HR 87 | Temp 98.2°F | Ht 61.0 in | Wt 162.0 lb

## 2022-07-14 DIAGNOSIS — Z5321 Procedure and treatment not carried out due to patient leaving prior to being seen by health care provider: Secondary | ICD-10-CM

## 2022-07-14 DIAGNOSIS — E039 Hypothyroidism, unspecified: Secondary | ICD-10-CM

## 2022-07-14 MED ORDER — LEVOTHYROXINE SODIUM 25 MCG PO TABS
25.0000 ug | ORAL_TABLET | Freq: Every day | ORAL | 2 refills | Status: DC
  Filled 2022-07-14 – 2022-08-01 (×2): qty 30, 30d supply, fill #0
  Filled 2022-09-03: qty 30, 30d supply, fill #1
  Filled 2022-11-12: qty 30, 30d supply, fill #2

## 2022-07-14 MED ORDER — SPIRONOLACTONE 25 MG PO TABS
12.5000 mg | ORAL_TABLET | Freq: Every day | ORAL | 0 refills | Status: DC
  Filled 2022-07-14 – 2022-09-03 (×3): qty 45, 90d supply, fill #0

## 2022-07-14 NOTE — Telephone Encounter (Signed)
Called & spoke to the patient. Verified name & DOB. I tried to reschedule the patient's last appointment. Alice Martinez stated that she will call back to reschedule once she receives her work schedule. No further questions at this time.

## 2022-07-14 NOTE — Progress Notes (Signed)
Pt left before being seen as she had to pick up some one. I called pt at the end of my clinic session.  I apologize that she had to leave before I got to see her.  I was told that she needed refill on levothyroxine and her blood pressure medication.  I told her I will send those today and have my CMA call her to reschedule her appointment.  She was agreeable to this.

## 2022-07-15 ENCOUNTER — Other Ambulatory Visit (HOSPITAL_BASED_OUTPATIENT_CLINIC_OR_DEPARTMENT_OTHER): Payer: Self-pay

## 2022-07-15 ENCOUNTER — Other Ambulatory Visit (HOSPITAL_COMMUNITY): Payer: Self-pay

## 2022-08-01 ENCOUNTER — Other Ambulatory Visit: Payer: Self-pay | Admitting: Internal Medicine

## 2022-08-01 ENCOUNTER — Other Ambulatory Visit (HOSPITAL_COMMUNITY): Payer: Self-pay

## 2022-08-02 ENCOUNTER — Other Ambulatory Visit: Payer: Self-pay

## 2022-08-05 ENCOUNTER — Other Ambulatory Visit: Payer: Self-pay

## 2022-08-05 ENCOUNTER — Other Ambulatory Visit (HOSPITAL_COMMUNITY): Payer: Self-pay

## 2022-08-19 ENCOUNTER — Telehealth (INDEPENDENT_AMBULATORY_CARE_PROVIDER_SITE_OTHER): Payer: Medicaid Other | Admitting: Student in an Organized Health Care Education/Training Program

## 2022-08-19 ENCOUNTER — Other Ambulatory Visit (HOSPITAL_COMMUNITY): Payer: Self-pay

## 2022-08-19 ENCOUNTER — Other Ambulatory Visit: Payer: Self-pay

## 2022-08-19 ENCOUNTER — Encounter (HOSPITAL_COMMUNITY): Payer: Self-pay | Admitting: Student in an Organized Health Care Education/Training Program

## 2022-08-19 DIAGNOSIS — F411 Generalized anxiety disorder: Secondary | ICD-10-CM | POA: Diagnosis not present

## 2022-08-19 DIAGNOSIS — F4312 Post-traumatic stress disorder, chronic: Secondary | ICD-10-CM

## 2022-08-19 DIAGNOSIS — F3170 Bipolar disorder, currently in remission, most recent episode unspecified: Secondary | ICD-10-CM | POA: Diagnosis not present

## 2022-08-19 DIAGNOSIS — G4700 Insomnia, unspecified: Secondary | ICD-10-CM

## 2022-08-19 MED ORDER — BUSPIRONE HCL 5 MG PO TABS
5.0000 mg | ORAL_TABLET | Freq: Two times a day (BID) | ORAL | 1 refills | Status: DC
Start: 2022-08-19 — End: 2022-10-24
  Filled 2022-08-19 – 2022-09-03 (×2): qty 60, 30d supply, fill #0

## 2022-08-19 MED ORDER — TRAZODONE HCL 50 MG PO TABS
50.0000 mg | ORAL_TABLET | Freq: Every day | ORAL | 1 refills | Status: DC
Start: 2022-08-19 — End: 2022-10-24
  Filled 2022-08-19 – 2022-09-03 (×2): qty 30, 30d supply, fill #0

## 2022-08-19 MED ORDER — QUETIAPINE FUMARATE 200 MG PO TABS
200.0000 mg | ORAL_TABLET | Freq: Every day | ORAL | 1 refills | Status: DC
Start: 2022-08-19 — End: 2022-10-24
  Filled 2022-08-19 – 2022-09-03 (×2): qty 30, 30d supply, fill #0
  Filled 2022-10-12: qty 30, 30d supply, fill #1

## 2022-08-19 NOTE — Progress Notes (Signed)
BH MD/PA/NP OP Progress Note  Virtual Visit via Video Note  I connected with Alice Martinez on 08/19/22 at  1:00 PM EDT by a video enabled telemedicine application and verified that I am speaking with the correct person using two identifiers.  Location: Patient: Work Provider: [facility]   I discussed the limitations of evaluation and management by telemedicine and the availability of in person appointments. The patient expressed understanding and agreed to proceed.  08/19/2022 1:19 PM Alice Martinez  MRN:  [id number]  Chief Complaint:  Chief Complaint  Patient presents with   Follow-up   Medication Refill   HPI:  Alice Martinez is a 47 yr old female who presents via Virtual Video Visit for Follow Up and Medication Management. PPHx is significant for Bipolar Disorder, PTSD, History of Polysubstance Abuse (EtOH, Fentanyl, Heroin, Xanax, Cocaine), GAD, MDD, and OCD, prior suicide attempts (OD x2 Dec 2022), 2 hospitalizations (latest [facility] 06/2020), and [facility] 08/2021).   She reports that she has been doing good since her last appointment.  She reports that her medications are well controlling her symptoms.  She reports her mood has remained stable.  She reports no side effects to her medications.  She reports that her new job continues to be less stressful.  She reports that she is maintaining her sobriety.  Discussed with her that medication changes did not seem to be needed and she was in agreement with this.  Discussed refills would be sent in.  She reports no SI, HI, or AVH.  She reports sleep is good.  Reports her appetite is doing good.  She reports no other concerns at present.  She will return follow-up approximate 2 months.  Discussed with patient that Resident Provider would be transitioning their care to another Resident Provider starting July 2024. She reported understanding and had no concerns.   Visit Diagnosis:    ICD-10-CM   1. Bipolar affective disorder in  remission  F31.70 QUEtiapine (SEROQUEL) 200 MG tablet    2. GAD (generalized anxiety disorder)  F41.1 busPIRone (BUSPAR) 5 MG tablet    QUEtiapine (SEROQUEL) 200 MG tablet    3. Chronic post-traumatic stress disorder (PTSD)  F43.12 QUEtiapine (SEROQUEL) 200 MG tablet    4. Insomnia, unspecified type  G47.00 traZODone (DESYREL) 50 MG tablet      Past Psychiatric History: Bipolar Disorder, PTSD, History of Polysubstance Abuse (EtOH, Fentanyl, Heroin, Xanax, Cocaine), GAD, MDD, and OCD, prior suicide attempts (OD x2 Dec 2022), 2 hospitalizations (latest [facility] 06/2020), and [facility]idential Re[facility] 08/2021).   Past Medical History:  Past Medical History:  Diagnosis Date   ADHD (attention deficit hyperactivity disorder)    Depression    High cholesterol    Hypertension    Thyroid disease     Past Surgical History:  Procedure Laterality Date   TONSILLECTOMY     TUBAL LIGATION      Family Psychiatric History: Mother- unknown diagnosis and Substance Use Father- Depression and EtOH Abuse  Family History:  Family History  Problem Relation Age of Onset   Hypertension Mother    Hypertension Father    Diabetes Father    Heart disease Father     Social History:  Social History   Socioeconomic History   Marital status: Divorced    Spouse name: Not on file   Number of children: Not on file   Years of education: Not on file   Highest education level: Not on file  Occupational History  Not on file  Tobacco Use   Smoking status: Every Day    Packs/day: 0.50    Years: 25.00    Additional pack years: 0.00    Total pack years: 12.50    Types: Cigarettes   Smokeless tobacco: Never  Vaping Use   Vaping Use: Never used  Substance and Sexual Activity   Alcohol use: Not Currently   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   Sexual activity: Yes    Birth control/protection: Surgical  Other Topics Concern   Not on file  Social History Narrative   Not on file   Social  Determinants of Health   Financial Resource Strain: Not on file  Food Insecurity: Not on file  Transportation Needs: Not on file  Physical Activity: Not on file  Stress: Not on file  Social Connections: Not on file    Allergies: No Known Allergies  Metabolic Disorder Labs: Lab Results  Component Value Date   HGBA1C 5.6 03/10/2022   MPG 116.89 06/07/2020   No results found for: "PROLACTIN" Lab Results  Component Value Date   CHOL 278 (H) 06/07/2020   TRIG 214 (H) 06/07/2020   HDL 61 06/07/2020   CHOLHDL 4.6 06/07/2020   VLDL 43 (H) 06/07/2020   LDLCALC 174 (H) 06/07/2020   Lab Results  Component Value Date   TSH 0.862 01/01/2022   TSH 2.028 04/04/2021    Therapeutic Level Labs: No results found for: "LITHIUM" No results found for: "VALPROATE" No results found for: "CBMZ"  Current Medications: Current Outpatient Medications  Medication Sig Dispense Refill   Buprenorphine HCl-Naloxone HCl 8-2 MG FILM Place under the tongue daily.     busPIRone (BUSPAR) 5 MG tablet Take 1 tablet (5 mg total) by mouth 2 (two) times daily. 60 tablet 1   furosemide (LASIX) 20 MG tablet Take 1 tablet (20 mg total) by mouth daily as needed. 10 tablet 0   levothyroxine (SYNTHROID) 25 MCG tablet Take 1 tablet (25 mcg total) by mouth daily before breakfast. 30 tablet 2   QUEtiapine (SEROQUEL) 200 MG tablet Take 1 tablet (200 mg total) by mouth at bedtime. 30 tablet 1   spironolactone (ALDACTONE) 25 MG tablet Take 0.5 tablets (12.5 mg total) by mouth daily. 45 tablet 0   traZODone (DESYREL) 50 MG tablet Take 1 tablet (50 mg total) by mouth at bedtime. 30 tablet 1   No current facility-administered medications for this visit.     Musculoskeletal: Strength & Muscle Tone: within normal limits Gait & Station: normal Patient leans: N/A  Psychiatric Specialty Exam: Review of Systems  Respiratory:  Negative for shortness of breath.   Cardiovascular:  Negative for chest pain.   Gastrointestinal:  Negative for abdominal pain, constipation, diarrhea, nausea and vomiting.  Neurological:  Negative for dizziness, weakness and headaches.  Psychiatric/Behavioral:  Negative for dysphoric mood, hallucinations, sleep disturbance and suicidal ideas. The patient is not nervous/anxious.     There were no vitals taken for this visit.There is no height or weight on file to calculate BMI.  General Appearance: Casual and Fairly Groomed  Eye Contact:  Good  Speech:  Clear and Coherent and Normal Rate  Volume:  Normal  Mood:   "good"  Affect:  Appropriate and Congruent  Thought Process:  Coherent and Goal Directed  Orientation:  Full (Time, Place, and Person)  Thought Content: WDL and Logical   Suicidal Thoughts:  No  Homicidal Thoughts:  No  Memory:  Immediate;  Good Recent;   Good  Judgement:  Good  Insight:  Good  Psychomotor Activity:  Normal  Concentration:  Concentration: Good and Attention Span: Good  Recall:  Good  Fund of Knowledge: Good  Language: Good  Akathisia:  Negative  Handed:  Right  AIMS (if indicated): not done  Assets:  Communication Skills Desire for Improvement Resilience Social Support Vocational/Educational  ADL's:  Intact  Cognition: WNL  Sleep:  Good   Screenings: AIMS    Flowsheet Row [facility] (Dis[facility]  AIMS Total Score 0      AUDIT    Flowsheet Row Admission (Discharged) [facility]  Alcohol Use Disorder Identification Test Final Score (AUDIT) 0      GAD-7    Flowsheet Row Office Visit from 07/14/2022 in [facility] Office Visit from 03/14/2022 in [facility] Office Visit from 01/31/2022 in [facility] Office Visit from 11/12/2021 in [facility] Office  Visit from 02/25/2021 in [facility]  Total GAD-7 Score 0 3 4 0 21      PHQ2-9    Flowsheet Row Office Visit from 07/14/2022 in [facility] Office Visit from 03/14/2022 in [facility] Office Visit from 01/31/2022 in [facility] Office Visit from 11/12/2021 in [facility] Counselor from 05/05/2021 in [facility] at [facility]  PHQ-2 Total Score 0 0 1 0 2  PHQ-9 Total Score 3 0 11 -- 8      Flowsheet Row ED from 02/04/2022 in [facility] Emergency Department at [facility] ED to Hosp-Admission (Discharged) from 01/01/2022 in [facility] Video Visit from 06/02/2021 in [facility] at [facility]  C-SSRS RISK CATEGORY No Risk No Risk Error: Q3, 4, or 5 should not be populated when Q2 is No        Assessment and Plan:  Alice Martinez is a 47 yr old female who presents via Virtual Video Visit for Follow Up and Medication Management. PPHx is significant for Bipolar Disorder, PTSD, History of Polysubstance Abuse (EtOH, Fentanyl, Heroin, Xanax, Cocaine), GAD, MDD, and OCD, prior suicide attempts (OD x2 Dec 2022), 2 hospitalizations (latest [facility] 06/2020), and [facility]idential Re[facility] 08/2021).    Alice Martinez continues to do well with her current medication regiment without any side effects.  We will not make any changes to her medications at this time.  Refills were sent in.  She will return for follow-up in approximately 2 months.   Bipolar Disorder  Chronic PTSD: -Continue Seroquel 200 mg QHS.  30 tablets with 1 refill. -Continue Buspar 5 BID.  60 tablets with 1 refill.     History of Polysubstance Abuse (EtOH, Fentanyl, Heroin, Xanax, Cocaine): -Encouraged continuing abstinence from all substances       Insomnia: -Continue Trazodone 50 mg QHS.  30 tablets with 1 refill.   Collaboration of Care:   Patient/Guardian was advised Release of Information must be obtained prior to any record release in order to collaborate their care with an outside provider. Patient/Guardian was advised if they have not already done so to contact the registration department to sign all necessary  forms in order for [place] to release information regarding their care.   Consent: Patient/Guardian gives verbal consent for treatment and assignment of benefits for services provided during this visit. Patient/Guardian expressed understanding and agreed to proceed.    Lauro Franklin, MD 08/19/2022, 1:19 PM   Follow Up Instructions:    I discussed the assessment and treatment plan with the patient. The patient was provided an opportunity to ask questions and all were answered. The patient agreed with the plan and demonstrated an understanding of the instructions.   The patient was advised to call back or seek an in-person evaluation if the symptoms worsen or if the condition fails to improve as anticipated.  I provided 5 minutes of non-face-to-face time during this encounter.   Lauro Franklin, MD

## 2022-08-29 ENCOUNTER — Other Ambulatory Visit (HOSPITAL_COMMUNITY): Payer: Self-pay

## 2022-09-05 ENCOUNTER — Other Ambulatory Visit: Payer: Self-pay

## 2022-09-08 NOTE — Progress Notes (Deleted)
Office Visit Note  Patient: Alice Martinez             Date of Birth: 02-Jun-1975           MRN: [id number]             PCP: Marcine Matar, MD Referring: Marva Panda, NP Visit Date: 09/09/2022 Occupation: @GUAROCC @  Subjective:  No chief complaint on file.   History of Present Illness: Alice Martinez is a 47 y.o. female ***     Activities of Daily Living:  Patient reports morning stiffness for *** {minute/hour:19697}.   Patient {ACTIONS;DENIES/REPORTS:21021675::"Denies"} nocturnal pain.  Difficulty dressing/grooming: {ACTIONS;DENIES/REPORTS:21021675::"Denies"} Difficulty climbing stairs: {ACTIONS;DENIES/REPORTS:21021675::"Denies"} Difficulty getting out of chair: {ACTIONS;DENIES/REPORTS:21021675::"Denies"} Difficulty using hands for taps, buttons, cutlery, and/or writing: {ACTIONS;DENIES/REPORTS:21021675::"Denies"}  No Rheumatology ROS completed.   PMFS History:  Patient Active Problem List   Diagnosis Date Noted   Tobacco dependence 01/31/2022   History of vitamin D deficiency 01/31/2022   Cellulitis 01/01/2022   Depression 01/01/2022   Paresthesia 01/01/2022   Hypocalcemia 01/01/2022   Hypothyroidism 01/01/2022   Polysubstance abuse (HCC) 06/07/2020   Major depressive disorder, recurrent severe without psychotic features (HCC) 06/06/2020   Syncope 04/16/2020   Rhabdomyolysis 04/16/2020   Cocaine abuse (HCC) 04/16/2020   Weakness of right lower extremity 04/16/2020   PTSD (post-traumatic stress disorder) 05/27/2016   Dysthymia 05/27/2016   GAD (generalized anxiety disorder) 01/15/2016   Major depressive disorder, recurrent episode, moderate (HCC) 01/15/2016   Essential hypertension 01/15/2016   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   Mixed hyperlipidemia 12/09/2010    Past Medical History:  Diagnosis Date   ADHD (attention deficit hyperactivity disorder)    Depression    High cholesterol    Hypertension    Thyroid disease     Family  History  Problem Relation Age of Onset   Hypertension Mother    Hypertension Father    Diabetes Father    Heart disease Father    Past Surgical History:  Procedure Laterality Date   TONSILLECTOMY     TUBAL LIGATION     Social History   Social History Narrative   Not on file   Immunization History  Administered Date(s) Administered   Tdap 10/07/2015, 09/22/2016     Objective: Vital Signs: There were no vitals taken for this visit.   Physical Exam   Musculoskeletal Exam: ***  CDAI Exam: CDAI Score: -- Patient Global: --; Provider Global: -- Swollen: --; Tender: -- Joint Exam 09/09/2022   No joint exam has been documented for this visit   There is currently no information documented on the homunculus. Go to the Rheumatology activity and complete the homunculus joint exam.  Investigation: No additional findings.  Imaging: No results found.  Recent Labs: Lab Results  Component Value Date   WBC 6.6 02/04/2022   HGB 12.5 02/04/2022   PLT 265 02/04/2022   NA 137 02/04/2022   K 3.8 02/04/2022   CL 104 02/04/2022   CO2 25 02/04/2022   GLUCOSE 85 02/04/2022   BUN 20 02/04/2022   CREATININE 1.09 (H) 02/04/2022   BILITOT 0.3 02/04/2022   ALKPHOS 45 02/04/2022   AST 16 02/04/2022   ALT 9 02/04/2022   PROT 5.8 (L) 03/10/2022   ALBUMIN 4.0 02/04/2022   CALCIUM 8.8 (L) 02/04/2022   GFRAA >60 03/10/2019    Speciality Comments: No specialty comments available.  Procedures:  No procedures performed Allergies: Patient has no known allergies.   Assessment / Plan:  Visit Diagnoses: No diagnosis found.  Orders: No orders of the defined types were placed in this encounter.  No orders of the defined types were placed in this encounter.   Face-to-face time spent with patient was *** minutes. Greater than 50% of time was spent in counseling and coordination of care.  Follow-Up Instructions: No follow-ups on file.   Fuller Plan, MD  Note - This  record has been created using [employer].  Chart creation errors have been sought, but may not always  have been located. Such creation errors do not reflect on  the standard of medical care.

## 2022-09-09 ENCOUNTER — Encounter: Payer: Commercial Managed Care - HMO | Admitting: Internal Medicine

## 2022-10-12 ENCOUNTER — Other Ambulatory Visit (HOSPITAL_COMMUNITY): Payer: Self-pay

## 2022-10-24 ENCOUNTER — Telehealth (INDEPENDENT_AMBULATORY_CARE_PROVIDER_SITE_OTHER): Payer: Medicaid Other | Admitting: Student in an Organized Health Care Education/Training Program

## 2022-10-24 ENCOUNTER — Encounter (HOSPITAL_COMMUNITY): Payer: Self-pay | Admitting: Student in an Organized Health Care Education/Training Program

## 2022-10-24 ENCOUNTER — Other Ambulatory Visit: Payer: Self-pay

## 2022-10-24 ENCOUNTER — Other Ambulatory Visit (HOSPITAL_COMMUNITY): Payer: Self-pay

## 2022-10-24 DIAGNOSIS — F3170 Bipolar disorder, currently in remission, most recent episode unspecified: Secondary | ICD-10-CM

## 2022-10-24 DIAGNOSIS — G4700 Insomnia, unspecified: Secondary | ICD-10-CM | POA: Diagnosis not present

## 2022-10-24 DIAGNOSIS — F4312 Post-traumatic stress disorder, chronic: Secondary | ICD-10-CM

## 2022-10-24 DIAGNOSIS — F411 Generalized anxiety disorder: Secondary | ICD-10-CM

## 2022-10-24 MED ORDER — TRAZODONE HCL 50 MG PO TABS
50.0000 mg | ORAL_TABLET | Freq: Every day | ORAL | 2 refills | Status: DC
Start: 2022-10-24 — End: 2022-12-30
  Filled 2022-10-24 – 2022-11-12 (×3): qty 30, 30d supply, fill #0
  Filled 2022-12-10: qty 30, 30d supply, fill #1

## 2022-10-24 MED ORDER — QUETIAPINE FUMARATE 200 MG PO TABS
200.0000 mg | ORAL_TABLET | Freq: Every day | ORAL | 2 refills | Status: DC
Start: 2022-10-24 — End: 2022-12-30
  Filled 2022-10-24 – 2022-11-12 (×2): qty 30, 30d supply, fill #0
  Filled 2022-12-10: qty 30, 30d supply, fill #1

## 2022-10-24 MED ORDER — BUSPIRONE HCL 5 MG PO TABS
5.0000 mg | ORAL_TABLET | Freq: Two times a day (BID) | ORAL | 2 refills | Status: DC
Start: 2022-10-24 — End: 2023-05-04
  Filled 2022-10-24 (×2): qty 60, 30d supply, fill #0

## 2022-10-24 NOTE — Progress Notes (Signed)
BH MD/PA/NP OP Progress Note  Virtual Visit via Video Note  I connected with Alice Martinez on 10/24/22 at  2:00 PM EDT by a video enabled telemedicine application and verified that I am speaking with the correct person using two identifiers.  Location: Patient: Work Provider: [facility]   I discussed the limitations of evaluation and management by telemedicine and the availability of in person appointments. The patient expressed understanding and agreed to proceed.  10/24/2022 2:09 PM Alice Martinez  MRN:  [id number]  Chief Complaint:  Chief Complaint  Patient presents with   Follow-up   Medication Refill   HPI:  Alice Martinez is a 47 yr old female who presents via Virtual Video Visit for Follow Up and Medication Management. PPHx is significant for Bipolar Disorder, PTSD, History of Polysubstance Abuse (EtOH, Fentanyl, Heroin, Xanax, Cocaine), GAD, MDD, and OCD, prior suicide attempts (OD x2 Dec 2022), 2 hospitalizations (latest [facility] 06/2020), and [facility]).   She reports that she has continued to do well since her last appointment.  She reports that her mood has remained stable.  She reports that overall she is doing good.  She reports no side effects with her medications.  Discussed with her that we would not make any changes to her medications at this time and she was agreeable with this.  She reports she has continued to maintain her sobriety.  She reports no SI, HI, or AVH.  She reports her sleep is good.  She reports her appetite is doing good.  She reports no other concerns at present.  She will return for follow-up approximately 2 months.  Discussed with patient that Resident Provider would be transitioning their care to another Resident Provider, Dr. Alfonse Flavors, starting July 2024. She reported understanding and had no concerns.   Visit Diagnosis:    ICD-10-CM   1. Insomnia, unspecified type  G47.00 traZODone (DESYREL) 50 MG tablet    2. GAD (generalized  anxiety disorder)  F41.1 QUEtiapine (SEROQUEL) 200 MG tablet    busPIRone (BUSPAR) 5 MG tablet    3. Chronic post-traumatic stress disorder (PTSD)  F43.12 QUEtiapine (SEROQUEL) 200 MG tablet    4. Bipolar affective disorder in remission (HCC)  F31.70 QUEtiapine (SEROQUEL) 200 MG tablet      Past Psychiatric History: Bipolar Disorder, PTSD, History of Polysubstance Abuse (EtOH, Fentanyl, Heroin, Xanax, Cocaine), GAD, MDD, and OCD, prior suicide attempts (OD x2 Dec 2022), 2 hospitalizations (latest [facility] 06/2020), and [facility]/20[facility]).   Past Medical History:  Past Medical History:  Diagnosis Date   ADHD (attention deficit hyperactivity disorder)    Depression    High cholesterol    Hypertension    Thyroid disease     Past Surgical History:  Procedure Laterality Date   TONSILLECTOMY     TUBAL LIGATION      Family Psychiatric History: Mother- unknown diagnosis and Substance Use Father- Depression and EtOH Abuse  Family History:  Family History  Problem Relation Age of Onset   Hypertension Mother    Hypertension Father    Diabetes Father    Heart disease Father     Social History:  Social History   Socioeconomic History   Marital status: Divorced    Spouse name: Not on file   Number of children: Not on file   Years of education: Not on file   Highest education level: Not on file  Occupational History   Not on file  Tobacco Use   Smoking  status: Every Day    Packs/day: 0.50    Years: 25.00    Additional pack years: 0.00    Total pack years: 12.50    Types: Cigarettes   Smokeless tobacco: Never  Vaping Use   Vaping Use: Never used  Substance and Sexual Activity   Alcohol use: Not Currently   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   Sexual activity: Yes    Birth control/protection: Surgical  Other Topics Concern   Not on file  Social History Narrative   Not on file   Social Determinants of Health   Financial Resource Strain: Not on  file  Food Insecurity: Not on file  Transportation Needs: Not on file  Physical Activity: Not on file  Stress: Not on file  Social Connections: Not on file    Allergies: No Known Allergies  Metabolic Disorder Labs: Lab Results  Component Value Date   HGBA1C 5.6 03/10/2022   MPG 116.89 06/07/2020   No results found for: "PROLACTIN" Lab Results  Component Value Date   CHOL 278 (H) 06/07/2020   TRIG 214 (H) 06/07/2020   HDL 61 06/07/2020   CHOLHDL 4.6 06/07/2020   VLDL 43 (H) 06/07/2020   LDLCALC 174 (H) 06/07/2020   Lab Results  Component Value Date   TSH 0.862 01/01/2022   TSH 2.028 04/04/2021    Therapeutic Level Labs: No results found for: "LITHIUM" No results found for: "VALPROATE" No results found for: "CBMZ"  Current Medications: Current Outpatient Medications  Medication Sig Dispense Refill   Buprenorphine HCl-Naloxone HCl 8-2 MG FILM Place under the tongue daily.     busPIRone (BUSPAR) 5 MG tablet Take 1 tablet (5 mg total) by mouth 2 (two) times daily. 60 tablet 2   furosemide (LASIX) 20 MG tablet Take 1 tablet (20 mg total) by mouth daily as needed. 10 tablet 0   levothyroxine (SYNTHROID) 25 MCG tablet Take 1 tablet (25 mcg total) by mouth daily before breakfast. 30 tablet 2   QUEtiapine (SEROQUEL) 200 MG tablet Take 1 tablet (200 mg total) by mouth at bedtime. 30 tablet 2   spironolactone (ALDACTONE) 25 MG tablet Take 0.5 tablets (12.5 mg total) by mouth daily. 45 tablet 0   traZODone (DESYREL) 50 MG tablet Take 1 tablet (50 mg total) by mouth at bedtime. 30 tablet 2   No current facility-administered medications for this visit.     Musculoskeletal: Strength & Muscle Tone: within normal limits Gait & Station: normal Patient leans: N/A  Psychiatric Specialty Exam: Review of Systems  Respiratory:  Negative for shortness of breath.   Cardiovascular:  Negative for chest pain.  Gastrointestinal:  Negative for abdominal pain, constipation, diarrhea,  nausea and vomiting.  Neurological:  Negative for dizziness, weakness and headaches.  Psychiatric/Behavioral:  Negative for agitation, dysphoric mood, hallucinations, sleep disturbance and suicidal ideas. The patient is not nervous/anxious.     There were no vitals taken for this visit.There is no height or weight on file to calculate BMI.  General Appearance: Casual and Fairly Groomed  Eye Contact:  Good  Speech:  Clear and Coherent and Normal Rate  Volume:  Normal  Mood:   "good"  Affect:  Appropriate and Congruent  Thought Process:  Coherent and Goal Directed  Orientation:  Full (Time, Place, and Person)  Thought Content: WDL and Logical   Suicidal Thoughts:  No  Homicidal Thoughts:  No  Memory:  Immediate;   Good Recent;   Good  Judgement:  Good  Insight:  Good  Psychomotor Activity:  Normal  Concentration:  Concentration: Good and Attention Span: Good  Recall:  Good  Fund of Knowledge: Good  Language: Good  Akathisia:  Negative  Handed:  Right  AIMS (if indicated): not done  Assets:  Communication Skills Desire for Improvement Resilience Social Support Vocational/Educational  ADL's:  Intact  Cognition: WNL  Sleep:  Good   Screenings: AIMS    Flowsheet Row [facility] (Dis[facility]  AIMS Total Score 0      AUDIT    Flowsheet Row Admission (Discharged) [facility]  Alcohol Use Disorder Identification Test Final Score (AUDIT) 0      GAD-7    Flowsheet Row Office Visit [facility]14/2022 in [facility] Office Visit from 03/14/2022 in [facility] Office Visit from 01/31/2022 in [facility] Office Visit from 11/12/2021 in [facility] Office Visit from 02[facility]25/2021 in [facility]  Total GAD-7 Score 0 3 4 0 21      PHQ2-9    Flowsheet Row Office Visit from 07/14/2022 in [facility] Office Visit from 03/14/2022 in [facility] Office Visit from 01/31/2022 in [facility] Office Visit from 11/12/2021 in [facility] Counselor from 05/05/2021 in [facility]  PHQ-2 Total Score 0 0 1 0 2  PHQ-9 Total Score 3 0 11 -- 8      Flowsheet Row ED from 02/04/2022 in [facility] at [facility] ED to Hosp-Admission (Discharged) from 01/01/2022 in [facility] Video Visit from 06/02/2021 in [facility] at [facility]  C-SSRS RISK CATEGORY No Risk No Risk Error: Q3, 4, or 5 should not be populated when Q2 is No        Assessment and Plan:  Tinzley Dalia is a 47 yr old female who presents via Virtual Video Visit for Follow Up and Medication Management. PPHx is significant for Bipolar Disorder, PTSD, History of Polysubstance Abuse (EtOH, Fentanyl, Heroin, Xanax, Cocaine), GAD, MDD, and OCD, prior suicide attempts (OD x2 Dec 2022), 2 hospitalizations (latest [facility] 06/2020), and [facility]).    Alice Martinez continues to remain sober and stable with her current medication regimen.  We will not make any changes to her medications at this time.  Refills were sent in.  She will return for follow-up in approximately 2 months.   Bipolar Disorder  Chronic PTSD: -Continue Seroquel 200 mg QHS.  30 tablets with 2 refills. -Continue Buspar 5 BID.  60 tablets with 2 refills.     History of Polysubstance Abuse (EtOH, Fentanyl, Heroin, Xanax, Cocaine): -Encouraged continuing abstinence from all substances      Insomnia: -Continue Trazodone 50 mg QHS.  30 tablets with  2 refills.   Collaboration of Care:   Patient/Guardian was advised Release of Information must be obtained prior to any record release in order to collaborate their care with an outside provider. Patient/Guardian was advised if they have not already done so to contact the registration department to sign all necessary forms in order for [place] to release information regarding their  care.   Consent: Patient/Guardian gives verbal consent for treatment and assignment of benefits for services provided during this visit. Patient/Guardian expressed understanding and agreed to proceed.    Lauro Franklin, MD 10/24/2022, 2:09 PM   Follow Up Instructions:    I discussed the assessment and treatment plan with the patient. The patient was provided an opportunity to ask questions and all were answered. The patient agreed with the plan and demonstrated an understanding of the instructions.   The patient was advised to call back or seek an in-person evaluation if the symptoms worsen or if the condition fails to improve as anticipated.  I provided 6 minutes of non-face-to-face time during this encounter.   Lauro Franklin, MD

## 2022-10-26 ENCOUNTER — Other Ambulatory Visit: Payer: Self-pay

## 2022-10-28 ENCOUNTER — Other Ambulatory Visit: Payer: Self-pay

## 2022-11-12 ENCOUNTER — Other Ambulatory Visit (HOSPITAL_COMMUNITY): Payer: Self-pay

## 2022-11-12 ENCOUNTER — Other Ambulatory Visit: Payer: Self-pay | Admitting: Internal Medicine

## 2022-11-14 ENCOUNTER — Other Ambulatory Visit: Payer: Self-pay

## 2022-11-14 ENCOUNTER — Other Ambulatory Visit (HOSPITAL_COMMUNITY): Payer: Self-pay

## 2022-11-14 MED ORDER — SPIRONOLACTONE 25 MG PO TABS
12.5000 mg | ORAL_TABLET | Freq: Every day | ORAL | 0 refills | Status: DC
  Filled 2022-11-14: qty 15, 30d supply, fill #0

## 2022-11-14 NOTE — Telephone Encounter (Signed)
Requested medications are due for refill today.  yes  Requested medications are on the active medications list.  yes  Last refill. 07/14/2022 #45 0 rf  Future visit scheduled.   no  Notes to clinic.  Pt did not stay to see provider at appt 4 months ago.    Requested Prescriptions  Pending Prescriptions Disp Refills   spironolactone (ALDACTONE) 25 MG tablet 45 tablet 0    Sig: Take 0.5 tablets (12.5 mg total) by mouth daily.     Cardiovascular: Diuretics - Aldosterone Antagonist Failed - 11/12/2022  9:38 AM      Failed - Cr in normal range and within 180 days    Creatinine, Ser  Date Value Ref Range Status  02/04/2022 1.09 (H) 0.44 - 1.00 mg/dL Final   Creatinine, Urine  Date Value Ref Range Status  01/03/2022 113 mg/dL Final         Failed - K in normal range and within 180 days    Potassium  Date Value Ref Range Status  02/04/2022 3.8 3.5 - 5.1 mmol/L Final         Failed - Na in normal range and within 180 days    Sodium  Date Value Ref Range Status  02/04/2022 137 135 - 145 mmol/L Final  01/31/2022 139 134 - 144 mmol/L Final         Failed - eGFR is 30 or above and within 180 days    GFR calc Af Amer  Date Value Ref Range Status  03/10/2019 >60 >60 mL/min Final   GFR, Estimated  Date Value Ref Range Status  02/04/2022 >60 >60 mL/min Final    Comment:    (NOTE) Calculated using the CKD-EPI Creatinine Equation (2021)    eGFR  Date Value Ref Range Status  01/31/2022 102 >59 mL/min/1.73 Final         Failed - Valid encounter within last 6 months    Recent Outpatient Visits           4 months ago Patient left before evaluation by physician   [facility] Jonah Blue B, MD   8 months ago Pap smear for cervical cancer screening   [facility] Marcine Matar, MD   9 months ago Establishing care with new doctor, encounter for   [facility]  Marcine Matar, MD              Passed - Last BP in normal range    BP Readings from Last 1 Encounters:  07/14/22 138/88

## 2022-12-10 ENCOUNTER — Other Ambulatory Visit (HOSPITAL_COMMUNITY): Payer: Self-pay

## 2022-12-10 ENCOUNTER — Other Ambulatory Visit: Payer: Self-pay | Admitting: Internal Medicine

## 2022-12-10 DIAGNOSIS — E039 Hypothyroidism, unspecified: Secondary | ICD-10-CM

## 2022-12-12 ENCOUNTER — Other Ambulatory Visit (HOSPITAL_COMMUNITY): Payer: Self-pay

## 2022-12-12 ENCOUNTER — Other Ambulatory Visit: Payer: Self-pay

## 2022-12-12 MED ORDER — LEVOTHYROXINE SODIUM 25 MCG PO TABS
25.0000 ug | ORAL_TABLET | Freq: Every day | ORAL | 0 refills | Status: DC
Start: 2022-12-12 — End: 2023-06-12
  Filled 2022-12-12: qty 30, 30d supply, fill #0

## 2022-12-12 NOTE — Telephone Encounter (Signed)
Requested medication (s) are due for refill today: Yes  Requested medication (s) are on the active medication list: Yes  Last refill:  11/14/22  Future visit scheduled: No  Notes to clinic:  Routing to provider due to courtesy refill already given, approval needed.     Requested Prescriptions  Pending Prescriptions Disp Refills   spironolactone (ALDACTONE) 25 MG tablet 15 tablet 0    Sig: Take 0.5 tablets (12.5 mg total) by mouth daily.     Cardiovascular: Diuretics - Aldosterone Antagonist Failed - 12/10/2022 11:23 AM      Failed - Cr in normal range and within 180 days    Creatinine, Ser  Date Value Ref Range Status  02/04/2022 1.09 (H) 0.44 - 1.00 mg/dL Final   Creatinine, Urine  Date Value Ref Range Status  01/03/2022 113 mg/dL Final         Failed - K in normal range and within 180 days    Potassium  Date Value Ref Range Status  02/04/2022 3.8 3.5 - 5.1 mmol/L Final         Failed - Na in normal range and within 180 days    Sodium  Date Value Ref Range Status  02/04/2022 137 135 - 145 mmol/L Final  01/31/2022 139 134 - 144 mmol/L Final         Failed - eGFR is 30 or above and within 180 days    GFR calc Af Amer  Date Value Ref Range Status  03/10/2019 >60 >60 mL/min Final   GFR, Estimated  Date Value Ref Range Status  02/04/2022 >60 >60 mL/min Final    Comment:    (NOTE) Calculated using the CKD-EPI Creatinine Equation (2021)    eGFR  Date Value Ref Range Status  01/31/2022 102 >59 mL/min/1.73 Final         Failed - Valid encounter within last 6 months    Recent Outpatient Visits           5 months ago Patient left before evaluation by physician   [facility] Jonah Blue B, MD   9 months ago Pap smear for cervical cancer screening   [facility] Marcine Matar, MD   10 months ago Establishing care with new doctor, encounter for   [facility] Marcine Matar, MD              Passed - Last BP in normal range    BP Readings from Last 1 Encounters:  07/14/22 138/88         Signed Prescriptions Disp Refills   levothyroxine (SYNTHROID) 25 MCG tablet 30 tablet 0    Sig: Take 1 tablet (25 mcg total) by mouth daily before breakfast. OFFICE VISIT NEEDED FOR ADDITIONAL REFILLS     Endocrinology:  Hypothyroid Agents Passed - 12/10/2022 11:23 AM      Passed - TSH in normal range and within 360 days    TSH  Date Value Ref Range Status  01/01/2022 0.862 0.350 - 4.500 uIU/mL Final    Comment:    Performed by a 3rd Generation assay with a functional sensitivity of <=0.01 uIU/mL. Performed at [facility], [address] [place], [place] [address]          Passed - Valid encounter within last 12 months    Recent Outpatient Visits  5 months ago Patient left before evaluation by physician   [facility] Jonah Blue B, MD   9 months ago Pap smear for cervical cancer screening   [facility] Marcine Matar, MD   10 months ago Establishing care with new doctor, encounter for   [facility] Marcine Matar, MD

## 2022-12-12 NOTE — Telephone Encounter (Signed)
Requested Prescriptions  Pending Prescriptions Disp Refills   levothyroxine (SYNTHROID) 25 MCG tablet 30 tablet 0    Sig: Take 1 tablet (25 mcg total) by mouth daily before breakfast. OFFICE VISIT NEEDED FOR ADDITIONAL REFILLS     Endocrinology:  Hypothyroid Agents Passed - 12/10/2022 11:23 AM      Passed - TSH in normal range and within 360 days    TSH  Date Value Ref Range Status  01/01/2022 0.862 0.350 - 4.500 uIU/mL Final    Comment:    Performed by a 3rd Generation assay with a functional sensitivity of <=0.01 uIU/mL. Performed at [facility], [address] [place], [place] [address]          Passed - Valid encounter within last 12 months    Recent Outpatient Visits           5 months ago Patient left before evaluation by physician   [facility] Jonah Blue B, MD   9 months ago Pap smear for cervical cancer screening   [facility] Marcine Matar, MD   10 months ago Establishing care with new doctor, encounter for   [facility] Jonah Blue B, MD               spironolactone (ALDACTONE) 25 MG tablet 15 tablet 0    Sig: Take 0.5 tablets (12.5 mg total) by mouth daily.     Cardiovascular: Diuretics - Aldosterone Antagonist Failed - 12/10/2022 11:23 AM      Failed - Cr in normal range and within 180 days    Creatinine, Ser  Date Value Ref Range Status  02/04/2022 1.09 (H) 0.44 - 1.00 mg/dL Final   Creatinine, Urine  Date Value Ref Range Status  01/03/2022 113 mg/dL Final         Failed - K in normal range and within 180 days    Potassium  Date Value Ref Range Status  02/04/2022 3.8 3.5 - 5.1 mmol/L Final         Failed - Na in normal range and within 180 days    Sodium  Date Value Ref Range Status  02/04/2022 137 135 - 145 mmol/L Final  01/31/2022 139 134 - 144 mmol/L Final         Failed - eGFR is 30 or above and within 180 days     GFR calc Af Amer  Date Value Ref Range Status  03/10/2019 >60 >60 mL/min Final   GFR, Estimated  Date Value Ref Range Status  02/04/2022 >60 >60 mL/min Final    Comment:    (NOTE) Calculated using the CKD-EPI Creatinine Equation (2021)    eGFR  Date Value Ref Range Status  01/31/2022 102 >59 mL/min/1.73 Final         Failed - Valid encounter within last 6 months    Recent Outpatient Visits           5 months ago Patient left before evaluation by physician   [facility] Jonah Blue B, MD   9 months ago Pap smear for cervical cancer screening   [facility] Marcine Matar, MD   10 months ago Establishing care with new doctor, encounter for   [facility] Marcine Matar, MD  Passed - Last BP in normal range    BP Readings from Last 1 Encounters:  07/14/22 138/88

## 2022-12-13 ENCOUNTER — Other Ambulatory Visit: Payer: Self-pay

## 2022-12-30 ENCOUNTER — Encounter (HOSPITAL_COMMUNITY): Payer: Self-pay | Admitting: Student

## 2022-12-30 ENCOUNTER — Ambulatory Visit (INDEPENDENT_AMBULATORY_CARE_PROVIDER_SITE_OTHER): Payer: MEDICAID | Admitting: Student

## 2022-12-30 ENCOUNTER — Other Ambulatory Visit: Payer: Self-pay

## 2022-12-30 VITALS — BP 96/67 | HR 77 | Ht 61.0 in | Wt 161.6 lb

## 2022-12-30 DIAGNOSIS — F3170 Bipolar disorder, currently in remission, most recent episode unspecified: Secondary | ICD-10-CM | POA: Diagnosis not present

## 2022-12-30 DIAGNOSIS — F1191 Opioid use, unspecified, in remission: Secondary | ICD-10-CM

## 2022-12-30 DIAGNOSIS — G4700 Insomnia, unspecified: Secondary | ICD-10-CM

## 2022-12-30 DIAGNOSIS — F4312 Post-traumatic stress disorder, chronic: Secondary | ICD-10-CM

## 2022-12-30 DIAGNOSIS — F1091 Alcohol use, unspecified, in remission: Secondary | ICD-10-CM | POA: Insufficient documentation

## 2022-12-30 DIAGNOSIS — F1491 Cocaine use, unspecified, in remission: Secondary | ICD-10-CM

## 2022-12-30 DIAGNOSIS — F419 Anxiety disorder, unspecified: Secondary | ICD-10-CM | POA: Diagnosis not present

## 2022-12-30 DIAGNOSIS — F1321 Sedative, hypnotic or anxiolytic dependence, in remission: Secondary | ICD-10-CM

## 2022-12-30 DIAGNOSIS — F17200 Nicotine dependence, unspecified, uncomplicated: Secondary | ICD-10-CM

## 2022-12-30 MED ORDER — QUETIAPINE FUMARATE 200 MG PO TABS
200.0000 mg | ORAL_TABLET | Freq: Every day | ORAL | 2 refills | Status: DC
Start: 2023-02-10 — End: 2023-05-04
  Filled 2022-12-30 – 2023-01-18 (×3): qty 30, 30d supply, fill #0
  Filled 2023-02-19: qty 30, 30d supply, fill #1
  Filled 2023-03-28: qty 30, 30d supply, fill #2

## 2022-12-30 MED ORDER — TRAZODONE HCL 50 MG PO TABS
50.0000 mg | ORAL_TABLET | Freq: Every day | ORAL | 2 refills | Status: DC
Start: 2023-02-10 — End: 2023-05-04
  Filled 2022-12-30 – 2023-02-19 (×2): qty 30, 30d supply, fill #0

## 2022-12-30 NOTE — Progress Notes (Signed)
BH MD/PA/NP OP Progress Note  12/30/2022 10:27 AM [facility]  MRN:  [id number]  Chief Complaint:  Chief Complaint  Patient presents with   Follow-up   Medication Refill   HPI:  Alice [facility] is a 47 yr old female who presents in-person to [facility] for Follow Up and Medication Management. PPHx is significant for Bipolar Disorder, PTSD, History of Polysubstance Abuse (EtOH, Fentanyl, Heroin, Xanax, Cocaine), GAD, MDD, and OCD, prior suicide attempts (OD x2 Dec 2022), 2 hospitalizations (latest [facility] 06/2020), and [facility] 08/2021).   Patient reports that things are going well overall. Her mood has been "good." She feels in a stable place regarding work and home. Although she has had some stress from her youngest daughter and grandchildren living with her, she is managing it well.  She reports  sleeping well, appetite intact, energy normal with some mild fluctuations. Overall, her energy level has regulated with medications. She denies anhedonia, as she finds enjoyment in spending time with her grandchildren. Denies SI, HI, AVH.   Denies anxiety. Denies paranoia or feeling on edge. Denies sx of mania/hypomania as well as psychotic sx.  Tobacco: 0.5 ppd now. Vaping daily, but not all throughout the day. Denies alcohol use, illicit drug use in 20 months since rehabilitation with continued therapy and medication management, work, and keeping herself busy. Going to [employer] for her birthday, and she will be leaving today.  Constipation from Suboxone, but in the process of tapering off of it (plan to be off next year). Takes metamucil. Denies AE of psychotropic medications. Compliant with medications.     Visit Diagnosis:    ICD-10-CM   1. Bipolar affective disorder in remission (HCC)  F31.70 QUEtiapine (SEROQUEL) 200 MG tablet    2. Mild anxiety  F41.9 QUEtiapine (SEROQUEL) 200 MG tablet    3. Chronic post-traumatic stress disorder (PTSD)  F43.12 QUEtiapine (SEROQUEL) 200 MG  tablet    4. Cocaine use disorder in remission  F14.91     5. Alcohol use disorder in remission  F10.91     6. 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 Opioid use disorder in remission  F11.91     8. Tobacco use disorder  F17.200     9. Insomnia, unspecified type  G47.00 traZODone (DESYREL) 50 MG tablet       Past Psychiatric History: Bipolar Disorder, PTSD, History of Polysubstance Abuse (EtOH, Fentanyl, Heroin, Xanax, Cocaine), GAD, MDD, and OCD, prior suicide attempts (OD x2 Dec 2022), 2 hospitalizations (latest [facility] 06/2020), and [facility]).   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   Major depressive disorder, recurrent episode, moderate (HCC) 01/15/2016   Major depressive disorder, recurrent severe without psychotic features (HCC) 06/06/2020   Polysubstance abuse (HCC) 06/07/2020   Thyroid disease     Past Surgical History:  Procedure Laterality Date   TONSILLECTOMY     TUBAL LIGATION      Family Psychiatric History: Mother- unknown diagnosis and Substance Use Father- Depression and EtOH Abuse  Family History:  Family History  Problem Relation Age of Onset   Hypertension Mother    Hypertension Father    Diabetes Father    Heart disease Father     Social  History: She works out of [employer], but still in [place].  Social History   Socioeconomic History   Marital status: Divorced    Spouse name: Not on file   Number of children: Not on file   Years of education: Not on file   Highest education level: Not on file  Occupational History   Not on file  Tobacco Use   Smoking status: Every Day    Current packs/day: 0.50    Average packs/day: 0.5 packs/day for 25.0 years (12.5 ttl pk-yrs)    Types:  Cigarettes, E-cigarettes   Smokeless tobacco: Never  Vaping Use   Vaping status: Never Used  Substance and Sexual Activity   Alcohol use: Not Currently   Drug use: No    Comment: 20 months sober as of 12/2022   Sexual activity: Yes    Birth control/protection: Surgical  Other Topics Concern   Not on file  Social History Narrative   Not on file   Social Determinants of Health   Financial Resource Strain: Not on file  Food Insecurity: No Food Insecurity (08/12/2020)   Received from [facility]   Hunger Vital Sign    Worried About Running Out of Food in the Last Year: Never true    Ran Out of Food in the Last Year: Never true  Transportation Needs: Not on file  Physical Activity: Not on file  Stress: Not on file  Social Connections: Unknown (09/04/2021)   Received from [facility]   Social Network    Social Network: Not on file    Allergies: No Known Allergies  Metabolic Disorder Labs: Lab Results  Component Value Date   HGBA1C 5.6 03/10/2022   MPG 116.89 06/07/2020   No results found for: "PROLACTIN" Lab Results  Component Value Date   CHOL 278 (H) 06/07/2020   TRIG 214 (H) 06/07/2020   HDL 61 06/07/2020   CHOLHDL 4.6 06/07/2020   VLDL 43 (H) 06/07/2020   LDLCALC 174 (H) 06/07/2020   Lab Results  Component Value Date   TSH 0.862 01/01/2022   TSH 2.028 04/04/2021    Therapeutic Level Labs: No results found for: "LITHIUM" No results found for: "VALPROATE" No results found for: "CBMZ"  Current Medications: Current Outpatient Medications  Medication Sig Dispense Refill   Buprenorphine HCl-Naloxone HCl 8-2 MG FILM Place under the tongue daily.     busPIRone (BUSPAR) 5 MG tablet Take 1 tablet (5 mg total) by mouth 2 (two) times daily. 60 tablet 2   levothyroxine (SYNTHROID) 25 MCG tablet Take 1 tablet (25 mcg) by mouth daily before breakfast. OFFICE VISIT NEEDED FOR ADDITIONAL REFILLS 30 tablet 0   [START ON 02/10/2023] QUEtiapine (SEROQUEL) 200 MG tablet  Take 1 tablet (200 mg total) by mouth at bedtime. 30 tablet 2   spironolactone (ALDACTONE) 25 MG tablet Take 0.5 tablets (12.5 mg total) by mouth daily. 15 tablet 0   [START ON 02/10/2023] traZODone (DESYREL) 50 MG tablet Take 1 tablet (50 mg total) by mouth at bedtime. 30 tablet 2   furosemide (LASIX) 20 MG tablet Take 1 tablet (20 mg total) by mouth daily as needed. (Patient not taking: Reported on 12/30/2022) 10 tablet 0   No current facility-administered medications for this visit.     Musculoskeletal: Strength & Muscle Tone: within normal limits Gait & Station: normal Patient leans: N/A  Psychiatric Specialty Exam: Review of Systems  Respiratory:  Negative for shortness of breath.   Cardiovascular:  Negative for chest pain.  Gastrointestinal:  Positive for constipation. Negative for abdominal pain, diarrhea, nausea and vomiting.       Ongoing with use of Suboxone  Neurological:  Negative for dizziness, weakness and headaches.  Psychiatric/Behavioral:  Negative for agitation, dysphoric mood, hallucinations, sleep disturbance and suicidal ideas. The patient is not nervous/anxious.     Blood pressure 96/67, pulse 77, height 5\' 1"  (1.549 m), weight 161 lb 9.6 oz (73.3 kg), SpO2 100%.Body mass index is 30.53 kg/m.  General Appearance: Well Groomed  Eye Contact:  Good  Speech:  Clear and Coherent and Normal Rate  Volume:  Normal  Mood:   "good"  Affect:  Appropriate and Congruent  Thought Process:  Coherent and Goal Directed  Orientation:  Full (Time, Place, and Person)  Thought Content: WDL and Logical   Suicidal Thoughts:  No  Homicidal Thoughts:  No  Memory:  Immediate;   Good Recent;   Good  Judgement:  Good  Insight:  Good  Psychomotor Activity:  Restlessness; mild, but she states this is baseline  Concentration:  Concentration: Good and Attention Span: Good  Recall:  Good  Fund of Knowledge: Good  Language: Good  Akathisia:  Negative  Handed:  Right  AIMS (if  indicated): completed; 0  Assets:  Communication Skills Desire for Improvement Resilience Social Support Vocational/Educational  ADL's:  Intact  Cognition: WNL  Sleep:  Good   Screenings: AIMS    Flowsheet Row Admission (Discharged) [facility]  AIMS Total Score 0      AUDIT    Flowsheet Row Admission (Discharged) from [facility]  Alcohol Use Disorder Identification Test Final Score (AUDIT) 0      GAD-7    Flowsheet Row Office Visit from 07/14/2022 in [facility] Visit from 03[facility]14/2022 in [facility] Office Visit from 01/31/2022 in [facility] Office Visit from 11/12/2021 in [facility] from 02/25/2021 in [facility]  Total GAD-7 Score 0 3 4 0 21      PHQ2-9    Flowsheet Row Clinical Support from 12/30/2022 in [facility] Office Visit from 07/14/2022 in [facility] Office Visit from 03/14/2022 in [facility] Office Visit from 01/31/2022 in [facility] Office Visit from 11/12/2021 in [facility]  PHQ-2 Total Score 0 0 0 1 0  PHQ-9 Total Score -- 3 0 11 --      Flowsheet Row Clinical Support from 12/30/2022 in [facility] ED from 02/04/2022 in [facility] to Hosp-Admission (Discharged) from 01/01/2022 in [facility]  C-SSRS RISK CATEGORY No Risk No Risk No Risk        Assessment and Plan:  Chevonne Dubel is a 47 yr old female who presents Follow Up and Medication Management. PPHx is  significant for Bipolar Disorder, PTSD, History of Polysubstance Abuse- in remission (EtOH, Fentanyl, Heroin, Xanax, Cocaine), GAD, MDD, and OCD, prior suicide attempts (OD x2 Dec 2022), 2 hospitalizations (latest [facility] 06/2020), and [facility]/20[facility]).    Kalon continues to maintain sobriety and is stable with her current medication regimen. She is not a  safety concern to herself nor toward others. We will not make any changes to her medications at this time.  Refills were sent in.  She will return for follow-up in approximately 2 months.   #Bipolar Affective Disorder  #Chronic PTSD -Continue Seroquel 200 mg QHS.  30 tablets with 2 refills. -Continue Buspar 5 BID.  60 tablets with 2 refills.     #Polysubstance Abuse- in remission (EtOH, Fentanyl, Heroin, Xanax, Cocaine) #Tobacco Use Disorder -Encouraged continued abstinence from all substances  -Congratulated on decreased cigarette use thus far and encouraged to continue decreasing use to cessation     #Insomnia -Continue Trazodone 50 mg QHS.  30 tablets with 2 refills.   Collaboration of Care:   Patient/Guardian was advised Release of Information must be obtained prior to any record release in order to collaborate their care with an outside provider. Patient/Guardian was advised if they have not already done so to contact the registration department to sign all necessary forms in order for [place] to release information regarding their care.   Consent: Patient/Guardian gives verbal consent for treatment and assignment of benefits for services provided during this visit. Patient/Guardian expressed understanding and agreed to proceed.    Lamar Sprinkles, MD 12/30/2022, 10:27 AM

## 2023-01-03 ENCOUNTER — Other Ambulatory Visit (HOSPITAL_COMMUNITY): Payer: Self-pay

## 2023-01-18 ENCOUNTER — Other Ambulatory Visit (HOSPITAL_COMMUNITY): Payer: Self-pay

## 2023-02-19 ENCOUNTER — Other Ambulatory Visit (HOSPITAL_COMMUNITY): Payer: Self-pay

## 2023-02-20 ENCOUNTER — Other Ambulatory Visit (HOSPITAL_COMMUNITY): Payer: Self-pay

## 2023-02-20 ENCOUNTER — Other Ambulatory Visit: Payer: Self-pay

## 2023-02-24 ENCOUNTER — Encounter (HOSPITAL_COMMUNITY): Payer: Self-pay

## 2023-02-24 ENCOUNTER — Telehealth (HOSPITAL_COMMUNITY): Payer: MEDICAID | Admitting: Student

## 2023-03-16 ENCOUNTER — Telehealth (HOSPITAL_COMMUNITY): Payer: MEDICAID | Admitting: Student

## 2023-03-16 ENCOUNTER — Encounter (HOSPITAL_COMMUNITY): Payer: Self-pay

## 2023-03-16 NOTE — Patient Instructions (Signed)

## 2023-03-16 NOTE — Progress Notes (Unsigned)
   New Patient Office Visit   Subjective   Patient ID: [facility], female    DOB: 09/30/1975  Age: 47 y.o. MRN: [id number]  CC: No chief complaint on file.   HPI [facility] 47 year old female, presents to establish care. She  has a past medical history of ADHD (attention deficit hyperactivity disorder), Cocaine abuse (HCC) (04/16/2020), Depression, High cholesterol, Hypertension, Insomnia secondary to anxiety (02/13/2012), Major depressive disorder, recurrent episode, moderate (HCC) (01/15/2016), Major depressive disorder, recurrent severe without psychotic features (HCC) (06/06/2020), Polysubstance abuse (HCC) (06/07/2020), and Thyroid disease.  HPI    Outpatient Encounter Medications as of 03/17/2023  Medication Sig   Buprenorphine HCl-Naloxone HCl 8-2 MG FILM Place under the tongue daily.   busPIRone (BUSPAR) 5 MG tablet Take 1 tablet (5 mg total) by mouth 2 (two) times daily.   furosemide (LASIX) 20 MG tablet Take 1 tablet (20 mg total) by mouth daily as needed. (Patient not taking: Reported on 12/30/2022)   levothyroxine (SYNTHROID) 25 MCG tablet Take 1 tablet (25 mcg) by mouth daily before breakfast. OFFICE VISIT NEEDED FOR ADDITIONAL REFILLS   QUEtiapine (SEROQUEL) 200 MG tablet Take 1 tablet (200 mg total) by mouth at bedtime.   spironolactone (ALDACTONE) 25 MG tablet Take 0.5 tablets (12.5 mg total) by mouth daily.   traZODone (DESYREL) 50 MG tablet Take 1 tablet (50 mg total) by mouth at bedtime.   No facility-administered encounter medications on file as of 03/17/2023.    Past Surgical History:  Procedure Laterality Date   TONSILLECTOMY     TUBAL LIGATION      ROS    Objective    There were no vitals taken for this visit.  Physical Exam    Assessment & Plan:  There are no diagnoses linked to this encounter.  No follow-ups on file.   Cruzita Lederer Newman Nip, FNP

## 2023-03-17 ENCOUNTER — Ambulatory Visit: Payer: Medicaid Other | Admitting: Family Medicine

## 2023-03-17 DIAGNOSIS — R7301 Impaired fasting glucose: Secondary | ICD-10-CM

## 2023-03-17 DIAGNOSIS — Z136 Encounter for screening for cardiovascular disorders: Secondary | ICD-10-CM

## 2023-03-17 DIAGNOSIS — Z1211 Encounter for screening for malignant neoplasm of colon: Secondary | ICD-10-CM

## 2023-03-17 DIAGNOSIS — E559 Vitamin D deficiency, unspecified: Secondary | ICD-10-CM

## 2023-03-17 DIAGNOSIS — E538 Deficiency of other specified B group vitamins: Secondary | ICD-10-CM

## 2023-03-17 DIAGNOSIS — E038 Other specified hypothyroidism: Secondary | ICD-10-CM

## 2023-03-21 ENCOUNTER — Other Ambulatory Visit (HOSPITAL_COMMUNITY): Payer: Self-pay

## 2023-03-29 ENCOUNTER — Other Ambulatory Visit (HOSPITAL_COMMUNITY): Payer: Self-pay

## 2023-04-06 ENCOUNTER — Other Ambulatory Visit (HOSPITAL_COMMUNITY): Payer: Self-pay

## 2023-04-12 ENCOUNTER — Ambulatory Visit: Payer: Medicaid Other | Admitting: Family Medicine

## 2023-04-18 ENCOUNTER — Encounter: Payer: Self-pay | Admitting: Family Medicine

## 2023-05-03 ENCOUNTER — Other Ambulatory Visit: Payer: Self-pay

## 2023-05-03 ENCOUNTER — Encounter (HOSPITAL_COMMUNITY): Payer: Self-pay

## 2023-05-03 ENCOUNTER — Emergency Department (HOSPITAL_COMMUNITY): Payer: MEDICAID

## 2023-05-03 ENCOUNTER — Emergency Department (HOSPITAL_COMMUNITY)
Admission: EM | Admit: 2023-05-03 | Discharge: 2023-05-03 | Discharge status: Against Medical Advice | Payer: MEDICAID | Attending: Emergency Medicine | Admitting: Emergency Medicine

## 2023-05-03 DIAGNOSIS — S0990XA Unspecified injury of head, initial encounter: Secondary | ICD-10-CM | POA: Diagnosis present

## 2023-05-03 DIAGNOSIS — Y92410 Unspecified street and highway as the place of occurrence of the external cause: Secondary | ICD-10-CM | POA: Diagnosis not present

## 2023-05-03 DIAGNOSIS — Z5321 Procedure and treatment not carried out due to patient leaving prior to being seen by health care provider: Secondary | ICD-10-CM | POA: Diagnosis not present

## 2023-05-03 DIAGNOSIS — S0083XA Contusion of other part of head, initial encounter: Secondary | ICD-10-CM | POA: Insufficient documentation

## 2023-05-03 DIAGNOSIS — M25561 Pain in right knee: Secondary | ICD-10-CM | POA: Diagnosis not present

## 2023-05-03 DIAGNOSIS — M546 Pain in thoracic spine: Secondary | ICD-10-CM | POA: Diagnosis not present

## 2023-05-03 LAB — COMPREHENSIVE METABOLIC PANEL
ALT: 14 U/L (ref 0–44)
AST: 20 U/L (ref 15–41)
Albumin: 4.1 g/dL (ref 3.5–5.0)
Alkaline Phosphatase: 66 U/L (ref 38–126)
Anion gap: 8 (ref 5–15)
BUN: 14 mg/dL (ref 6–20)
CO2: 25 mmol/L (ref 22–32)
Calcium: 9.3 mg/dL (ref 8.9–10.3)
Chloride: 105 mmol/L (ref 98–111)
Creatinine, Ser: 0.94 mg/dL (ref 0.44–1.00)
GFR, Estimated: >60 mL/min (ref >60)
Glucose, Bld: 111 mg/dL — ABNORMAL HIGH (ref 70–99)
Potassium: 4.5 mmol/L (ref 3.5–5.1)
Sodium: 138 mmol/L (ref 135–145)
Total Bilirubin: 0.2 mg/dL (ref 0.0–1.2)
Total Protein: 6.4 g/dL — ABNORMAL LOW (ref 6.5–8.1)

## 2023-05-03 LAB — CBC WITH DIFFERENTIAL/PLATELET
Abs Immature Granulocytes: 0.07 10*3/uL (ref 0.00–0.07)
Basophils Absolute: 0.1 10*3/uL (ref 0.0–0.1)
Basophils Relative: 0 %
Eosinophils Absolute: 0.1 10*3/uL (ref 0.0–0.5)
Eosinophils Relative: 0 %
HCT: 43.2 % (ref 36.0–46.0)
Hemoglobin: 14.3 g/dL (ref 12.0–15.0)
Immature Granulocytes: 1 %
Lymphocytes Relative: 15 %
Lymphs Abs: 2 10*3/uL (ref 0.7–4.0)
MCH: 29.4 pg (ref 26.0–34.0)
MCHC: 33.1 g/dL (ref 30.0–36.0)
MCV: 88.9 fL (ref 80.0–100.0)
Monocytes Absolute: 0.5 10*3/uL (ref 0.1–1.0)
Monocytes Relative: 4 %
Neutro Abs: 10.6 10*3/uL — ABNORMAL HIGH (ref 1.7–7.7)
Neutrophils Relative %: 80 %
Platelets: 369 10*3/uL (ref 150–400)
RBC: 4.86 MIL/uL (ref 3.87–5.11)
RDW: 12.9 % (ref 11.5–15.5)
WBC: 13.3 10*3/uL — ABNORMAL HIGH (ref 4.0–10.5)
nRBC: 0 % (ref 0.0–0.2)

## 2023-05-03 LAB — ETHANOL: Alcohol, Ethyl (B): <10 mg/dL (ref <10)

## 2023-05-03 LAB — HCG, SERUM, QUALITATIVE: Preg, Serum: NEGATIVE

## 2023-05-03 MED ORDER — OXYCODONE-ACETAMINOPHEN 5-325 MG PO TABS
1.0000 | ORAL_TABLET | ORAL | Status: DC | PRN
  Administered 2023-05-03: 1 via ORAL
  Filled 2023-05-03: qty 1
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
## 2023-05-03 NOTE — ED Notes (Signed)
 Pt called several times for vital signs. No response.

## 2023-05-04 ENCOUNTER — Other Ambulatory Visit: Payer: Self-pay | Admitting: Internal Medicine

## 2023-05-04 ENCOUNTER — Ambulatory Visit (INDEPENDENT_AMBULATORY_CARE_PROVIDER_SITE_OTHER): Payer: MEDICAID | Admitting: Student

## 2023-05-04 DIAGNOSIS — E039 Hypothyroidism, unspecified: Secondary | ICD-10-CM

## 2023-05-04 DIAGNOSIS — G4700 Insomnia, unspecified: Secondary | ICD-10-CM | POA: Diagnosis not present

## 2023-05-04 DIAGNOSIS — Z79899 Other long term (current) drug therapy: Secondary | ICD-10-CM

## 2023-05-04 DIAGNOSIS — F1191 Opioid use, unspecified, in remission: Secondary | ICD-10-CM

## 2023-05-04 DIAGNOSIS — F3170 Bipolar disorder, currently in remission, most recent episode unspecified: Secondary | ICD-10-CM

## 2023-05-04 DIAGNOSIS — F1311 Sedative, hypnotic or anxiolytic abuse, in remission: Secondary | ICD-10-CM | POA: Diagnosis not present

## 2023-05-04 DIAGNOSIS — F4312 Post-traumatic stress disorder, chronic: Secondary | ICD-10-CM

## 2023-05-04 DIAGNOSIS — F411 Generalized anxiety disorder: Secondary | ICD-10-CM

## 2023-05-04 MED ORDER — QUETIAPINE FUMARATE 200 MG PO TABS: 200.0000 mg | ORAL_TABLET | Freq: Every day | ORAL | 2 refills | Status: AC

## 2023-05-04 MED ORDER — TRAZODONE HCL 50 MG PO TABS: 50.0000 mg | ORAL_TABLET | Freq: Every day | ORAL | 2 refills | Status: AC

## 2023-05-04 MED ORDER — QUETIAPINE FUMARATE 25 MG PO TABS: 25.0000 mg | ORAL_TABLET | Freq: Two times a day (BID) | ORAL | 1 refills | Status: AC | PRN
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
## 2023-05-25 ENCOUNTER — Encounter (HOSPITAL_COMMUNITY): Payer: MEDICAID | Admitting: Student

## 2023-06-08 ENCOUNTER — Encounter (HOSPITAL_BASED_OUTPATIENT_CLINIC_OR_DEPARTMENT_OTHER): Payer: Self-pay | Admitting: Emergency Medicine

## 2023-06-08 ENCOUNTER — Other Ambulatory Visit: Payer: Self-pay

## 2023-06-08 ENCOUNTER — Emergency Department (HOSPITAL_BASED_OUTPATIENT_CLINIC_OR_DEPARTMENT_OTHER): Payer: MEDICAID | Admitting: Radiology

## 2023-06-08 DIAGNOSIS — Z79899 Other long term (current) drug therapy: Secondary | ICD-10-CM | POA: Diagnosis not present

## 2023-06-08 DIAGNOSIS — W010XXA Fall on same level from slipping, tripping and stumbling without subsequent striking against object, initial encounter: Secondary | ICD-10-CM | POA: Insufficient documentation

## 2023-06-08 DIAGNOSIS — S0083XA Contusion of other part of head, initial encounter: Secondary | ICD-10-CM | POA: Insufficient documentation

## 2023-06-08 DIAGNOSIS — I10 Essential (primary) hypertension: Secondary | ICD-10-CM | POA: Insufficient documentation

## 2023-06-08 DIAGNOSIS — M79641 Pain in right hand: Secondary | ICD-10-CM | POA: Diagnosis present

## 2023-06-08 DIAGNOSIS — Z79890 Hormone replacement therapy (postmenopausal): Secondary | ICD-10-CM | POA: Insufficient documentation

## 2023-06-08 DIAGNOSIS — E039 Hypothyroidism, unspecified: Secondary | ICD-10-CM | POA: Insufficient documentation

## 2023-06-08 DIAGNOSIS — M25559 Pain in unspecified hip: Secondary | ICD-10-CM | POA: Diagnosis not present

## 2023-06-08 DIAGNOSIS — S52591A Other fractures of lower end of right radius, initial encounter for closed fracture: Secondary | ICD-10-CM | POA: Insufficient documentation

## 2023-06-08 MED ORDER — IBUPROFEN 800 MG PO TABS
800.0000 mg | ORAL_TABLET | Freq: Once | ORAL | Status: AC
  Administered 2023-06-08: 800 mg via ORAL
  Filled 2023-06-08: qty 1

## 2023-06-08 NOTE — ED Triage Notes (Signed)
 Fall onto right hand yesterday. Right side of body denies LOC Pain and swelling

## 2023-06-09 ENCOUNTER — Emergency Department (HOSPITAL_BASED_OUTPATIENT_CLINIC_OR_DEPARTMENT_OTHER)
Admission: EM | Admit: 2023-06-09 | Discharge: 2023-06-09 | Disposition: A | Discharge status: Home / Self Care | Payer: MEDICAID | Attending: Emergency Medicine | Admitting: Emergency Medicine

## 2023-06-09 DIAGNOSIS — W19XXXA Unspecified fall, initial encounter: Secondary | ICD-10-CM

## 2023-06-09 DIAGNOSIS — S52591A Other fractures of lower end of right radius, initial encounter for closed fracture: Secondary | ICD-10-CM

## 2023-06-09 MED ORDER — HYDROCODONE-ACETAMINOPHEN 5-325 MG PO TABS
1.0000 | ORAL_TABLET | Freq: Once | ORAL | Status: AC
  Administered 2023-06-09: 1 via ORAL
  Filled 2023-06-09: qty 1

## 2023-06-09 NOTE — ED Notes (Signed)
Left without paperwork

## 2023-06-09 NOTE — Discharge Instructions (Signed)
 For pain control you may take 1000 mg of acetaminophen (Tylenol) every 8 hours and/or 600 mg of Ibuprofen (Motrin, Advil, etc.) every 6-8 hours as needed.  Please limit acetaminophen (Tylenol) to 4000 mg and Ibuprofen (Motrin, Advil, etc.) to 2400 mg for a 24hr period. Please note that other over-the-counter medicine may contain acetaminophen or ibuprofen as a component of their ingredients.

## 2023-06-09 NOTE — ED Provider Notes (Signed)
 [place] EMERGENCY DEPARTMENT AT [facility] Provider Note  CSN: [id number] Arrival date & time: 06/08/23 2218  Chief Complaint(s) Hand Injury  HPI Alice Martinez is a 48 y.o. female here for right hand pain following a mechanical fall.  Patient reports slipping on a wet hardwood floor causing her to fall onto her right side.  This occurred earlier this morning.  She endorsed mild head trauma without loss of consciousness.  Complains mostly of the right wrist pain that is gradually worsened since onset.  Worse with range of motion and palpation.  She is had associated swelling.  Also endorsed mild hip pain but has been ambulating well without complication.  She denies any neck pain or back pain.  No other extremity pain.  The history is provided by the patient.    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) 05/04/2023   Long term current use of antipsychotic medication 05/04/2023   Bipolar affective disorder in remission (HCC) 12/30/2022   Cocaine use disorder in remission 12/30/2022   Alcohol use disorder in remission 12/30/2022   Opioid use disorder in remission 12/30/2022   Tobacco dependence 01/31/2022   History of vitamin D  deficiency 01/31/2022   Cellulitis 01/01/2022   Depression 01/01/2022   Paresthesia 01/01/2022   Hypocalcemia 01/01/2022   Hypothyroidism 01/01/2022   Syncope 04/16/2020   Rhabdomyolysis 04/16/2020   Weakness of right  lower extremity 04/16/2020   Chronic post-traumatic stress disorder (PTSD) 05/27/2016   Dysthymia 05/27/2016   GAD (generalized anxiety disorder) 01/15/2016   Essential hypertension 01/15/2016   Mixed hyperlipidemia 12/09/2010   Home Medication(s) Prior to Admission medications   Medication Sig Start Date End Date Taking? Authorizing Provider  Buprenorphine HCl-Naloxone HCl 8-2 MG FILM Place under the tongue daily. 01/21/22   [provider]  levothyroxine  (SYNTHROID ) 25 MCG tablet Take 1 tablet (25 mcg) by mouth daily before breakfast. OFFICE VISIT NEEDED FOR ADDITIONAL REFILLS Patient not taking: Reported on 05/04/2023 12/12/22   Vicci Barnie NOVAK, MD  QUEtiapine  (SEROQUEL ) 200 MG tablet Take 1 tablet (200 mg total) by mouth at bedtime. 05/04/23   Barbra Jayson LABOR, MD  QUEtiapine  (SEROQUEL ) 25 MG tablet Take 1 tablet (25 mg total) by mouth 2 (two) times daily as needed (for anxiety). 05/04/23 07/03/23  Barbra Jayson LABOR, MD  spironolactone  (ALDACTONE ) 25 MG tablet Take 0.5 tablets (12.5 mg total) by mouth daily. Patient not taking: Reported on 05/04/2023 11/14/22   Vicci Barnie NOVAK, MD  traZODone  (DESYREL ) 50 MG tablet Take 1 tablet (50 mg total) by mouth at bedtime. 05/04/23   Barbra Jayson LABOR, MD  Allergies Patient has no known allergies.  Review of Systems Review of Systems As noted in HPI  Physical Exam Vital Signs  I have reviewed the triage vital signs BP (!) 149/89 (BP Location: Left Arm)   Pulse 100   Temp 98.7 F (37.1 C)   Resp 20   LMP 06/07/2023   SpO2 100%   Physical Exam Constitutional:      General: She is not in acute distress.    Appearance: She is well-developed. She is not diaphoretic.  HENT:     Head: Normocephalic. Contusion present.      Right Ear: External ear normal.     Left Ear: External ear normal.     Nose: Nose  normal.  Eyes:     General: No scleral icterus.       Right eye: No discharge.        Left eye: No discharge.     Conjunctiva/sclera: Conjunctivae normal.     Pupils: Pupils are equal, round, and reactive to light.  Cardiovascular:     Rate and Rhythm: Normal rate and regular rhythm.     Pulses:          Radial pulses are 2+ on the right side and 2+ on the left side.       Dorsalis pedis pulses are 2+ on the right side and 2+ on the left side.     Heart sounds: Normal heart sounds. No murmur heard.    No friction rub. No gallop.  Pulmonary:     Effort: Pulmonary effort is normal. No respiratory distress.     Breath sounds: Normal breath sounds. No stridor. No wheezing.  Abdominal:     General: There is no distension.     Palpations: Abdomen is soft.     Tenderness: There is no abdominal tenderness.  Musculoskeletal:     Right wrist: Swelling, tenderness, bony tenderness and snuff box tenderness present. Decreased range of motion. Normal pulse.     Cervical back: Normal range of motion and neck supple. No bony tenderness.     Thoracic back: No bony tenderness.     Lumbar back: No bony tenderness.     Comments: Clavicles stable. Chest stable to AP/Lat compression. Pelvis stable to Lat compression. No obvious extremity deformity. No chest or abdominal wall contusion.  Skin:    General: Skin is warm and dry.     Findings: No erythema or rash.  Neurological:     Mental Status: She is alert and oriented to person, place, and time.     Comments: Moving all extremities     ED Results and Treatments Labs (all labs ordered are listed, but only abnormal results are displayed) Labs Reviewed - No data to display                                                                                                                       EKG  EKG Interpretation Date/Time:    Ventricular Rate:  PR Interval:    QRS Duration:    QT Interval:    QTC Calculation:   R Axis:      Text  Interpretation:         Radiology DG Hand Complete Right Result Date: 06/08/2023 CLINICAL DATA:  Status post fall. EXAM: RIGHT HAND - COMPLETE 3+ VIEW COMPARISON:  August 01, 2017 FINDINGS: There is an acute nondisplaced fracture deformity involving the distal right radius with extension to involve the radiocarpal joint. A chronic fracture deformity is also seen involving the distal aspect of the fifth right metacarpal. There is no evidence of dislocation. There is no evidence of arthropathy or other focal bone abnormality. Moderate severity soft tissue swelling is seen involving the right wrist and dorsal aspect of the right hand. IMPRESSION: 1. Acute nondisplaced fracture of the distal right radius. 2. Chronic fracture deformity of the distal aspect of the fifth right metacarpal. Electronically Signed   By: Suzen Dials M.D.   On: 06/08/2023 22:57   DG Wrist Complete Right Result Date: 06/08/2023 CLINICAL DATA:  Status post fall. EXAM: RIGHT WRIST - COMPLETE 3+ VIEW COMPARISON:  None Available. FINDINGS: There is an acute, nondisplaced fracture deformity involving the distal right radius, with extension to involve the radiocarpal joint. There is no evidence of dislocation. There is no evidence of arthropathy or other focal bone abnormality. Moderate severity diffuse soft tissue swelling is seen involving the right wrist and dorsal aspect of the right hand. IMPRESSION: Acute fracture of the distal right radius. Electronically Signed   By: Suzen Dials M.D.   On: 06/08/2023 22:56    Medications Ordered in ED Medications  ibuprofen  (ADVIL ) tablet 800 mg (800 mg Oral Given 06/08/23 2243)  HYDROcodone -acetaminophen  (NORCO/VICODIN) 5-325 MG per tablet 1 tablet (1 tablet Oral Given 06/09/23 0432)   Procedures Procedures  (including critical care time) Medical Decision Making / ED Course   Medical Decision Making Amount and/or Complexity of Data Reviewed Radiology: ordered and independent  interpretation performed. Decision-making details documented in ED Course.  Risk Prescription drug management.    Mechanical fall. Mild head trauma without loss of consciousness.  Patient is not anticoagulated.  Doubt ICH requiring imaging at this time.  Low mechanism for cervical injury and no midline tenderness. X-ray of the right hand notable for distal radius fracture.  Patient does have some snuffbox tenderness.  Placed in a radial gutter with thumb spica.  Pain controlled with a dose of Norco.  Patient opted for nonnarcotic pain management at home.  Follow-up with hand surgery.    Final Clinical Impression(s) / ED Diagnoses Final diagnoses:  Other closed fracture of distal end of right radius, initial encounter  Fall, initial encounter   The patient appears reasonably screened and/or stabilized for discharge and I doubt any other medical condition or other [facility] requiring further screening, evaluation, or treatment in the ED at this time. I have discussed the findings, Dx and Tx plan with the patient/family who expressed understanding and agree(s) with the plan. Discharge instructions discussed at length. The patient/family was given strict return precautions who verbalized understanding of the instructions. No further questions at time of discharge.  Disposition: Discharge  Condition: Good  ED Discharge Orders     None         Follow Up: Vicci Barnie NOVAK, MD [address] [place] [address] [place] [address] [phone]  Call  to schedule an appointment for close follow up  Marsa Christen, MD [facility]  [address], [place] [address] [phone] Call      This chart was dictated using voice recognition software.  Despite best efforts to proofread,  errors can occur which can change the documentation meaning.    Trine Raynell Moder, MD 06/09/23 [phone]

## 2023-06-12 ENCOUNTER — Other Ambulatory Visit (HOSPITAL_BASED_OUTPATIENT_CLINIC_OR_DEPARTMENT_OTHER): Payer: Self-pay

## 2023-06-12 ENCOUNTER — Other Ambulatory Visit: Payer: Self-pay

## 2023-06-12 ENCOUNTER — Emergency Department (HOSPITAL_BASED_OUTPATIENT_CLINIC_OR_DEPARTMENT_OTHER)
Admission: EM | Admit: 2023-06-12 | Discharge: 2023-06-12 | Disposition: A | Discharge status: Home / Self Care | Payer: MEDICAID | Attending: Emergency Medicine | Admitting: Emergency Medicine

## 2023-06-12 ENCOUNTER — Encounter (HOSPITAL_BASED_OUTPATIENT_CLINIC_OR_DEPARTMENT_OTHER): Payer: Self-pay | Admitting: Emergency Medicine

## 2023-06-12 DIAGNOSIS — Z23 Encounter for immunization: Secondary | ICD-10-CM | POA: Diagnosis not present

## 2023-06-12 DIAGNOSIS — Z79899 Other long term (current) drug therapy: Secondary | ICD-10-CM | POA: Diagnosis not present

## 2023-06-12 DIAGNOSIS — W540XXA Bitten by dog, initial encounter: Secondary | ICD-10-CM | POA: Diagnosis not present

## 2023-06-12 DIAGNOSIS — S41151A Open bite of right upper arm, initial encounter: Secondary | ICD-10-CM | POA: Insufficient documentation

## 2023-06-12 DIAGNOSIS — I10 Essential (primary) hypertension: Secondary | ICD-10-CM | POA: Insufficient documentation

## 2023-06-12 MED ORDER — TETANUS-DIPHTH-ACELL PERTUSSIS 5-2.5-18.5 LF-MCG/0.5 IM SUSY
0.5000 mL | PREFILLED_SYRINGE | Freq: Once | INTRAMUSCULAR | Status: AC
  Administered 2023-06-12: 0.5 mL via INTRAMUSCULAR
  Filled 2023-06-12: qty 0.5

## 2023-06-12 MED ORDER — AMOXICILLIN-POT CLAVULANATE 875-125 MG PO TABS
1.0000 | ORAL_TABLET | Freq: Two times a day (BID) | ORAL | 0 refills | Status: AC
  Filled 2023-06-12: qty 14, 7d supply, fill #0

## 2023-06-12 NOTE — Discharge Instructions (Addendum)
 As discussed, your tetanus vaccine was updated.  Continue to wash areas gently with warm soapy water.  You may apply over-the-counter antibiotic ointment over areas.  Follow-up with animal control regarding quarantine status of dog.  Attached is number for hand specialist to follow-up with from your prior visit.  Please do not hesitate to return if the worrisome signs and symptoms we discussed become apparent.

## 2023-06-12 NOTE — ED Provider Notes (Signed)
[facility] EMERGENCY DEPARTMENT AT [facility] Provider Note   CSN: [id number] Arrival date & time: 06/12/23  4098     History  Chief Complaint  Patient presents with   Animal Bite    Alice Martinez is a 48 y.o. female.   Animal Bite   48 year old female presents emergency department with complaint of animal light.  Reports being bit by a [place] when she was walking on the street yesterday.  Reports that the bite was not deep and just scratched her right upper arm but was told by police to come to the ED to the ER for a tetanus vaccination.  The dog is currently in quarantine at the local animal shelter.  Unaware of vaccine status of dog as it was reportedly a homeless person's dog.  Patient denies any pain/trauma elsewhere.  Past medical history significant for polysubstance abuse, MDD, cocaine abuse, ADHD, hypercholesterolemia, hypertension, thyroid disease  Home Medications Prior to Admission medications   Medication Sig Start Date End Date Taking? Authorizing Provider  amoxicillin-clavulanate (AUGMENTIN) 875-125 MG tablet Take 1 tablet by mouth every 12 (twelve) hours. 06/12/23  Yes Sherian Maroon A, PA  Buprenorphine HCl-Naloxone HCl 8-2 MG FILM Place under the tongue daily. 01/21/22   [provider]  QUEtiapine (SEROQUEL) 200 MG tablet Take 1 tablet (200 mg total) by mouth at bedtime. 05/04/23   Elsie Lincoln, MD  QUEtiapine (SEROQUEL) 25 MG tablet Take 1 tablet (25 mg total) by mouth 2 (two) times daily as needed (for anxiety). 05/04/23 07/03/23  Elsie Lincoln, MD  traZODone (DESYREL) 50 MG tablet Take 1 tablet (50 mg total) by mouth at bedtime. 05/04/23   Elsie Lincoln, MD      Allergies    Patient has no known allergies.    Review of Systems   Review of Systems  All other systems reviewed and are negative.   Physical Exam Updated Vital Signs BP (!) 145/77   Pulse 95   Temp 97.7 F (36.5 C) (Oral)   Resp 16   LMP 06/07/2023    SpO2 100%  Physical Exam Vitals and nursing note reviewed.  Constitutional:      General: She is not in acute distress.    Appearance: She is well-developed.  HENT:     Head: Normocephalic.  Eyes:     Conjunctiva/sclera: Conjunctivae normal.  Cardiovascular:     Rate and Rhythm: Normal rate and regular rhythm.     Heart sounds: No murmur heard. Pulmonary:     Effort: Pulmonary effort is normal. No respiratory distress.     Breath sounds: Normal breath sounds.  Abdominal:     Palpations: Abdomen is soft.     Tenderness: There is no abdominal tenderness.  Musculoskeletal:        General: No swelling.     Cervical back: Neck supple.     Comments: Right forearm splint in place.  Superficial abrasions right upper arm just distal to right deltoid laterally.  Measuring 2 to 5 to 2.6 cm in length x 3.  Full range of motion of right shoulder.  No midline bony tenderness.  No surrounding redness, palpable fluctuance/induration.  Skin:    General: Skin is warm and dry.     Capillary Refill: Capillary refill takes less than 2 seconds.  Neurological:     Mental Status: She is alert.  Psychiatric:        Mood and Affect: Mood normal.     ED Results /  Procedures / Treatments   Labs (all labs ordered are listed, but only abnormal results are displayed) Labs Reviewed - No data to display  EKG None  Radiology No results found.  Procedures Procedures    Medications Ordered in ED Medications  Tdap (BOOSTRIX) injection 0.5 mL (0.5 mLs Intramuscular Given 06/12/23 1211)    ED Course/ Medical Decision Making/ A&P                                 Medical Decision Making Risk Prescription drug management.   This patient presents to the ED for concern of dog bite, this involves an extensive number of treatment options, and is a complaint that carries with it a high risk of complications and morbidity.  The differential diagnosis includes fracture, dislocation, cellulitis,   Co  morbidities that complicate the patient evaluation  See HPI   Additional history obtained:  Additional history obtained from EMR External records from outside source obtained and reviewed including hospital records   Lab Tests:  N/a   Imaging Studies ordered:  N/a   Cardiac Monitoring: / EKG:  The patient was maintained on a cardiac monitor.  I personally viewed and interpreted the cardiac monitored which showed an underlying rhythm of: sinus rhythm   Consultations Obtained:  N/a   Problem List / ED Course / Critical interventions / Medication management  Dog bite I ordered medication including tdap    Reevaluation of the patient after these medicines showed that the patient improved I have reviewed the patients home medicines and have made adjustments as needed   Social Determinants of Health:  Polysubstance use   Test / Admission - Considered:  Dog bite  Vitals signs within normal range and stable throughout visit. 48 year old female presents emergency department after dog bite suffered yesterday.  Dog with unknown vaccination status and reportedly owner was a homeless person.  On exam, superficial abrasions right upper arm without secondary skin changes concerning for infectious process.  No obvious bony tenderness to suspect fracture/dislocation with full range of motion of right shoulder.  Tdap was updated while in the ED.  Dog reportedly in quarantine so we will refrain from rabies treatment at this time pending quarantine results from dog.  Will place patient on antibiotics given reported dog bite although superficial in nature.  Will recommend local wound care at home and follow-up with primary care for reassessment.  Treatment plan discussed length with patient and she acknowledged understanding was agreeable to said plan.  Patient overall well-appearing, afebrile in no acute distress. Worrisome signs and symptoms were discussed with the patient, and the  patient acknowledged understanding to return to the ED if noticed. Patient was stable upon discharge.          Final Clinical Impression(s) / ED Diagnoses Final diagnoses:  Dog bite, initial encounter    Rx / DC Orders ED Discharge Orders          Ordered    amoxicillin-clavulanate (AUGMENTIN) 875-125 MG tablet  Every 12 hours        06/12/23 1214              Peter Garter, [place] 06/12/23 1218    Edwin Dada P, DO 06/17/23 [phone]

## 2023-06-12 NOTE — ED Triage Notes (Signed)
 Dog bite to R upper arm yesterday. Unsure of animal's vaccination status. Minor abrasions noted.

## 2023-06-23 ENCOUNTER — Other Ambulatory Visit (HOSPITAL_BASED_OUTPATIENT_CLINIC_OR_DEPARTMENT_OTHER): Payer: Self-pay

## 2023-07-11 ENCOUNTER — Other Ambulatory Visit (HOSPITAL_COMMUNITY): Payer: Self-pay
